# Patient Record
Sex: Female | Born: 1960 | Race: White | Hispanic: No | Smoking: Never smoker
Health system: Southern US, Community
[De-identification: ages and names within clinical notes are randomized; demographics above are authoritative.]

## PROBLEM LIST (undated history)

## (undated) ENCOUNTER — Emergency Department (HOSPITAL_COMMUNITY): Admission: EM | Disposition: A | Discharge status: Home / Self Care | Payer: Self-pay

## (undated) DIAGNOSIS — B009 Herpesviral infection, unspecified: Secondary | ICD-10-CM

## (undated) DIAGNOSIS — F909 Attention-deficit hyperactivity disorder, unspecified type: Secondary | ICD-10-CM

## (undated) DIAGNOSIS — F32A Depression, unspecified: Secondary | ICD-10-CM

## (undated) DIAGNOSIS — F319 Bipolar disorder, unspecified: Secondary | ICD-10-CM

## (undated) DIAGNOSIS — R51 Headache: Secondary | ICD-10-CM

## (undated) DIAGNOSIS — D689 Coagulation defect, unspecified: Secondary | ICD-10-CM

## (undated) DIAGNOSIS — F419 Anxiety disorder, unspecified: Secondary | ICD-10-CM

## (undated) DIAGNOSIS — E785 Hyperlipidemia, unspecified: Secondary | ICD-10-CM

## (undated) DIAGNOSIS — M1990 Unspecified osteoarthritis, unspecified site: Secondary | ICD-10-CM

## (undated) DIAGNOSIS — C44509 Unspecified malignant neoplasm of skin of other part of trunk: Secondary | ICD-10-CM

## (undated) DIAGNOSIS — Z5189 Encounter for other specified aftercare: Secondary | ICD-10-CM

## (undated) DIAGNOSIS — E079 Disorder of thyroid, unspecified: Secondary | ICD-10-CM

## (undated) DIAGNOSIS — K219 Gastro-esophageal reflux disease without esophagitis: Secondary | ICD-10-CM

## (undated) DIAGNOSIS — T7840XA Allergy, unspecified, initial encounter: Secondary | ICD-10-CM

## (undated) DIAGNOSIS — D649 Anemia, unspecified: Secondary | ICD-10-CM

## (undated) DIAGNOSIS — F329 Major depressive disorder, single episode, unspecified: Secondary | ICD-10-CM

## (undated) HISTORY — PX: MOUTH SURGERY: SHX715

## (undated) HISTORY — PX: KNEE SURGERY: SHX244

## (undated) HISTORY — DX: Disorder of thyroid, unspecified: E07.9

## (undated) HISTORY — PX: FRACTURE SURGERY: SHX138

## (undated) HISTORY — DX: Unspecified osteoarthritis, unspecified site: M19.90

## (undated) HISTORY — PX: OTHER SURGICAL HISTORY: SHX169

## (undated) HISTORY — DX: Anxiety disorder, unspecified: F41.9

## (undated) HISTORY — DX: Hyperlipidemia, unspecified: E78.5

## (undated) HISTORY — DX: Coagulation defect, unspecified: D68.9

## (undated) HISTORY — DX: Allergy, unspecified, initial encounter: T78.40XA

## (undated) HISTORY — DX: Encounter for other specified aftercare: Z51.89

## (undated) HISTORY — PX: FOOT SURGERY: SHX648

## (undated) HISTORY — PX: ABDOMINAL SURGERY: SHX537

## (undated) HISTORY — DX: Anemia, unspecified: D64.9

---

## 1984-03-16 DIAGNOSIS — B009 Herpesviral infection, unspecified: Secondary | ICD-10-CM

## 1984-03-16 HISTORY — DX: Herpesviral infection, unspecified: B00.9

## 2000-09-14 ENCOUNTER — Emergency Department (HOSPITAL_COMMUNITY): Admission: EM | Admit: 2000-09-14 | Discharge: 2000-09-14 | Payer: Self-pay

## 2002-06-16 ENCOUNTER — Other Ambulatory Visit: Admission: RE | Admit: 2002-06-16 | Discharge: 2002-06-16 | Payer: Self-pay | Admitting: Obstetrics and Gynecology

## 2002-07-24 ENCOUNTER — Other Ambulatory Visit: Admission: RE | Admit: 2002-07-24 | Discharge: 2002-07-24 | Payer: Self-pay | Admitting: Obstetrics and Gynecology

## 2003-03-26 ENCOUNTER — Encounter: Admission: RE | Admit: 2003-03-26 | Discharge: 2003-03-26 | Payer: Self-pay | Admitting: Obstetrics and Gynecology

## 2003-07-24 ENCOUNTER — Other Ambulatory Visit: Admission: RE | Admit: 2003-07-24 | Discharge: 2003-07-24 | Payer: Self-pay | Admitting: Obstetrics and Gynecology

## 2003-08-09 ENCOUNTER — Other Ambulatory Visit: Admission: RE | Admit: 2003-08-09 | Discharge: 2003-08-09 | Payer: Self-pay | Admitting: Obstetrics and Gynecology

## 2003-09-21 ENCOUNTER — Ambulatory Visit (HOSPITAL_BASED_OUTPATIENT_CLINIC_OR_DEPARTMENT_OTHER): Admission: RE | Admit: 2003-09-21 | Discharge: 2003-09-21 | Payer: Self-pay | Admitting: Obstetrics and Gynecology

## 2004-04-10 ENCOUNTER — Encounter: Admission: RE | Admit: 2004-04-10 | Discharge: 2004-04-10 | Payer: Self-pay | Admitting: Obstetrics and Gynecology

## 2005-01-08 ENCOUNTER — Other Ambulatory Visit: Admission: RE | Admit: 2005-01-08 | Discharge: 2005-01-08 | Payer: Self-pay | Admitting: Obstetrics and Gynecology

## 2005-05-05 ENCOUNTER — Encounter: Admission: RE | Admit: 2005-05-05 | Discharge: 2005-05-05 | Payer: Self-pay | Admitting: *Deleted

## 2005-08-18 ENCOUNTER — Encounter: Admission: RE | Admit: 2005-08-18 | Discharge: 2005-08-18 | Payer: Self-pay | Admitting: Neurology

## 2006-04-20 ENCOUNTER — Other Ambulatory Visit: Admission: RE | Admit: 2006-04-20 | Discharge: 2006-04-20 | Payer: Self-pay | Admitting: Obstetrics & Gynecology

## 2006-05-27 ENCOUNTER — Encounter: Admission: RE | Admit: 2006-05-27 | Discharge: 2006-05-27 | Payer: Self-pay | Admitting: Obstetrics and Gynecology

## 2007-05-13 ENCOUNTER — Other Ambulatory Visit: Admission: RE | Admit: 2007-05-13 | Discharge: 2007-05-13 | Payer: Self-pay | Admitting: Obstetrics & Gynecology

## 2007-06-22 ENCOUNTER — Encounter: Admission: RE | Admit: 2007-06-22 | Discharge: 2007-06-22 | Payer: Self-pay | Admitting: General Practice

## 2008-06-26 ENCOUNTER — Encounter: Admission: RE | Admit: 2008-06-26 | Discharge: 2008-06-26 | Payer: Self-pay | Admitting: Obstetrics and Gynecology

## 2008-06-28 ENCOUNTER — Other Ambulatory Visit: Admission: RE | Admit: 2008-06-28 | Discharge: 2008-06-28 | Payer: Self-pay | Admitting: Obstetrics and Gynecology

## 2010-04-06 ENCOUNTER — Encounter: Payer: Self-pay | Admitting: Obstetrics and Gynecology

## 2010-08-01 NOTE — Op Note (Signed)
NAME:  Kim [name], Kim [name]                          ACCOUNT NO.:  [id number]   MEDICAL RECORD NO.:  [id number]                   PATIENT TYPE:  AMB   LOCATION:  [facility]                                 FACILITY:  [facility]   PHYSICIAN:  Laqueta Linden, M.D.                 DATE OF BIRTH:  [birth year]   DATE OF PROCEDURE:  09/21/2003  DATE OF DISCHARGE:                                 OPERATIVE REPORT   PREOPERATIVE DIAGNOSES:  Menometrorrhagia.   POSTOPERATIVE DIAGNOSES:  Menometrorrhagia.   PROCEDURE:  Hydrothermal ablation.   SURGEON:  Laqueta Linden, M.D.   ANESTHESIA:  General LMA.   SORBITOL NET INTAKE:  Zero.   SPECIMENS:  None.   ESTIMATED BLOOD LOSS:  Less than 5 mL.   COMPLICATIONS:  None.   INDICATIONS FOR PROCEDURE:  Kim [name] is a 50 year old female who has a  long history of worsening menometrorrhagia. She has a history of severe  migraines and it is intolerant of any hormonal manipulation.  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 She has undergone  preoperative evaluation including pelvic ultrasound and a sonohysterogram  which revealed several 1-2 cm intramural fibroids with no intracavitary  lesion such as a submucosal fibroid noted.  Endometrial sampling was benign,  proliferative endometrium with fragments of a polyp noted. She underwent  preoperative endometrial preparation with a single injection of Depot-Lupron  during the luteal phase and surgery has been scheduled at this time which is  one month after the Lupron injection. She has seen the informed consent  film, voiced her understanding and acceptance of the risks, benefits,  alternatives, complications and limitations of this procedure and agrees to  proceed.   DESCRIPTION OF PROCEDURE:  The patient was taken to the operating room and  after proper identification and consents were ascertained, she was placed on  the operating table in the supine position. After the induction of general  LMA, she was placed in the Oakley stirrups and the perineum and vagina were  prepped and draped in a routine sterile fashion. The bladder was entered  with a red rubber catheter. The uterus was noted to be anterior, slightly  irregular and mobile.  A speculum was placed in the vagina and the cervix  was grasped with a single tooth tenaculum. The internal os was patent to a  sound and the uterine cavity sounded to 8 cm. The internal os was gently  dilated to a #21 Pratt dilator. The resectoscope with continuous sorbitol  infusion was inserted under direct vision. Both tubal ostia were visualized.  There was a slight ridge along the posterior uterine wall consistent with  perhaps a small submucosal component of one of the intramural fibroids but  no significant submucosal component was noted. There were no polyps or other  lesions noted.  The endometrial cavity appeared atrophic and to have  responded to the Lupron. The hydrothermal ablation procedure then proceeded  in a routine fashion. Fluid was instilled and pressures were checked and  remained steady consistent with no uterine perforation. The fluid was then  heated and a heated ablative procedure was then performed for 10 minutes at  which time the fluid was cooled. There was a slight amount of fluid deficit  of 3 mL well into the procedure felt to be consistent with an expansion of  the uterine cavity due to the heat and there was no further fluid deficit.  There was no evidence of any fluid leakage whatsoever through the cervix  which was watched continuously throughout the procedure.  After the fluid  was cooled posterior ablative procedure, a photograph was taken the scope  was then withdrawn. The tenaculum site was hemostatic. The patient was  awakened and stable on transfer to the PACU. She received 30 mg of IV  Toradol prior to entering the operating room  and received another 30 mg IM  after she was asleep. She will be observed and discharged per anesthesia  protocol and told to followup in the office in 2-3 weeks time. She was given  routine written and verbal discharge instructions and told to call sooner  for excessive pain, fever, bleeding or other concerns. She is to take  ibuprofen 800 mg q. 8h. p.r.n. cramping with food as well as maintain all  her routine medications. She is to call for excessive cramping or other  concerns.                                               Laqueta Linden, M.D.    Danie Chandler  D:  09/21/2003  T:  09/21/2003  Job:  [id number]

## 2011-07-19 ENCOUNTER — Encounter (HOSPITAL_COMMUNITY): Payer: Self-pay | Admitting: Emergency Medicine

## 2011-07-19 ENCOUNTER — Emergency Department (HOSPITAL_COMMUNITY)
Admission: EM | Admit: 2011-07-19 | Discharge: 2011-07-19 | Payer: Self-pay | Attending: Emergency Medicine | Admitting: Emergency Medicine

## 2011-07-19 DIAGNOSIS — Z01419 Encounter for gynecological examination (general) (routine) without abnormal findings: Secondary | ICD-10-CM | POA: Insufficient documentation

## 2011-07-19 DIAGNOSIS — F909 Attention-deficit hyperactivity disorder, unspecified type: Secondary | ICD-10-CM | POA: Insufficient documentation

## 2011-07-19 HISTORY — DX: Attention-deficit hyperactivity disorder, unspecified type: F90.9

## 2011-07-19 HISTORY — DX: Headache: R51

## 2011-07-19 NOTE — ED Provider Notes (Signed)
Medical screening examination/treatment/procedure(s) were performed by non-physician practitioner and as supervising physician I was immediately available for consultation/collaboration.   Nat Christen, MD 07/19/11 [phone]

## 2011-07-19 NOTE — ED Notes (Addendum)
Pt here with [employer] with a search warrant of vagina.  Pt denies any complaints. Pt states she was getting ready to use crack and that the police believed she put it inside of her.  Pt denies.

## 2011-07-19 NOTE — Discharge Instructions (Signed)
Pelvic Exam A pelvic (gynecologic) exam is an exam of a woman's outer and inner genitals and reproductive organs. At age 53, or before a woman starts to have sexual intercourse, she should have her first pelvic exam. Pelvic exams allow your caregiver to check on normal development and screen for health problems. These exams should be done regularly throughout a woman's life. Usually, a general physical exam is done first. An exam of the breasts is also done. At this visit, you can ask questions about your health, body, menstrual cycles, sex, and birth control methods. Your caregiver will also ask you questions about your health, family health, menstrual periods, immunizations, and if you are sexually active. The information shared between you and your caregiver is kept confidential. REASONS FOR A PELVIC EXAM  Annual exam and Pap test. A Pap test removes cells from the cervix gently with a spatula and a small brush. The cells are tested for infection, precancer, and cancer.   A Pap test is done to screen for cervical cancer.   The first Pap test should be done at age 5.   Between ages 12 and 61, Pap tests are repeated every 2 years.   Beginning at age 34, you are advised to have a Pap test every 3 years as long as your past 3 Pap tests have been normal.   Some women have medical problems that increase the chance of getting cervical cancer. Talk to your caregiver about these problems. It is especially important to talk to your caregiver if a new problem develops soon after your last Pap test. In these cases, your caregiver may recommend more frequent screening and Pap tests.   The above recommendations are the same for women who have or have not gotten the vaccine for HPV (Human Papillomavirus).   If you had a hysterectomy for a problem that was not cancer or a condition that could lead to cancer, then you no longer need Pap tests. However, even if you no longer need a Pap test, a regular exam is a  good idea to make sure no other problems are starting.    If you are between ages 34 and 19, and you have had normal Pap tests going back 10 years, you no longer need Pap tests. However, even if you no longer need a Pap test, a regular exam is a good idea to make sure no other problems are starting.    If you have had past treatment for cervical cancer or a condition that could lead to cancer, you need Pap tests and screening for cancer for at least 20 years after your treatment.   If Pap tests have been discontinued, risk factors (such as a new sexual partner) need to be re-assessed to determine if screening should be resumed.   Some women may need screenings more often if they are at high risk for cervical cancer.   Make sure your female organs are normal and functioning correctly.   Evaluate a mass or other symptoms that suggest a reproductive system cancer.   Explore why you are not able to get pregnant (infertility).   Find a cause for vaginal discharge, itching, or burning.   Get certain types of birth control or start hormone therapy.   Look for causes of urinary incontinence or sexual problems.   Look for signs of sexually transmitted infection (STI).   Follow the progression of labor.   Determine if pregnancy is present or how far advanced the pregnancy is.  You have severe cramps during your menstrual period.   You have pain during sexual intercourse.   You have abnormal menstrual periods.   You have no menstrual period by the age of 31.  PROCEDURE   A pelvic exam is usually painless but may cause mild discomfort.   In unusual circumstances or in young girls, medicines may be used for comfort. A pelvic exam is not done routinely before a girl is sexually active. Special circumstances such as rape, trauma, or medical problems may require an exam.   You will remove all your clothes and will be given a gown. Usually, there is a nurse in the room during the exam  and you can have someone from your family with you also.   The general physical exam will be done first.   Before the pelvic exam starts, the woman lies down on her back on a special table. She puts the heels of her feet into foot rests (stirrups) with her legs apart. A gown, cloth, or paper drape is usually placed over her belly (abdomen) and legs. First, the caregiver checks the normal arrangement of body parts of the outer genitals. This includes the clitoris, vaginal opening, hymen, labia, and the perineal area between the vagina and rectum. The labia are the skin folds surrounding the vaginal opening. The tube that carries urine (urethra) is also examined.   An internal exam is done next. First, the caregiver inserts an instrument called a speculum into the vagina. The speculum has lubricant on it. The speculum helps hold the vaginal walls apart. The caregiver can then examine the vagina and cervix, which is the opening to the womb (uterus). Cultures of any discharge may be taken to check for an infection. A Pap test may be done.   After the internal exam is done, the speculum is removed. The caregiver uses latex gloves with a lubricant on the fingers to gently press against various pelvic organs from inside the vagina while the other hand is on the lower belly. The caregiver will note any tenderness or abnormalities.   If a pelvic exam is done on a woman who is thought to be in labor, her caregiver can check on the baby and how far her cervix has opened.   Following the exam, you will get dressed and can speak with your caregiver.   Ask your caregiver when and how often you should return for future visits.  Finding out the results of your test Ask when your test results will be ready. Make sure you get your test results. TO HAVE A HEALTHY LIFESTYLE:  Follow your caregiver's advice regarding follow-up and future visits.   Get the necessary immunizations according to your age and any  traveling you may do.   Eat a balanced, nourishing diet.   Get plenty of rest and sleep.   Exercise regularly.   Maintain a healthy weight.   Do not smoke or take illegal drugs.   Drink alcohol in moderation or not at all.   If you are sexually active, use some form of birth control if you do not plan to get pregnant.   If you are sexually active, practice safe sex by using a condom to protect against sexually transmitted disease (STD).   Get help or counseling if you have emotional problems.  Document Released: 05/23/2002 Document Revised: 02/19/2011 Document Reviewed: 05/29/2009 [facility] Patient Information 2012 [place], [place].

## 2011-07-19 NOTE — ED Provider Notes (Signed)
History     CSN: [id number]  Arrival date & time 07/19/11  1632   First MD Initiated Contact with Patient 07/19/11 1650      No chief complaint on file.   (Consider location/radiation/quality/duration/timing/severity/associated sxs/prior treatment) HPI Comments: Pt was brought in by police to be evaluated for a possible vaginal or rectal fb:pt denies putting anything in either orifice  The history is provided by the patient. No language interpreter was used.    Past Medical History  Diagnosis Date  . Headache   . ADHD (attention deficit hyperactivity disorder)     Past Surgical History  Procedure Date  . Knee surgery     No family history on file.  History  Substance Use Topics  . Smoking status: Never Smoker   . Smokeless tobacco: Not on file  . Alcohol Use: No    OB History    Grav Para Term Preterm Abortions TAB SAB Ect Mult Living                  Review of Systems  Constitutional: Negative.   Respiratory: Negative.   Cardiovascular: Negative.     Allergies  Review of patient's allergies indicates no known allergies.  Home Medications   Current Outpatient Rx  Name Route Sig Dispense Refill  . IBUPROFEN 200 MG PO TABS Oral Take 200 mg by mouth every 6 (six) hours as needed. For pain or fever      BP 138/97  Pulse 81  Temp(Src) 97.7 F (36.5 C) (Oral)  Resp 20  SpO2 93%  Physical Exam  Nursing note and vitals reviewed. Constitutional: She appears well-developed and well-nourished.  Cardiovascular: Normal rate and regular rhythm.   Pulmonary/Chest: Effort normal and breath sounds normal.  Genitourinary:       No fb not on vaginal or rectal exam  Psychiatric: She is is hyperactive.    ED Course  Procedures (including critical care time)  Labs Reviewed - No data to display No results found.   1. Visit for pelvic exam       MDM  Pt to leave in the custody of the police       Teressa Lower, NP 07/19/11 1719

## 2011-11-05 ENCOUNTER — Emergency Department (HOSPITAL_COMMUNITY)
Admission: EM | Admit: 2011-11-05 | Discharge: 2011-11-08 | Disposition: A | Discharge status: Other Acute Inpatient Hospital | Payer: Self-pay | Attending: Emergency Medicine | Admitting: Emergency Medicine

## 2011-11-05 ENCOUNTER — Encounter (HOSPITAL_COMMUNITY): Payer: Self-pay

## 2011-11-05 DIAGNOSIS — F29 Unspecified psychosis not due to a substance or known physiological condition: Secondary | ICD-10-CM | POA: Insufficient documentation

## 2011-11-05 LAB — COMPREHENSIVE METABOLIC PANEL
Albumin: 3.9 g/dL (ref 3.5–5.2)
BUN: 18 mg/dL (ref 6–23)
Creatinine, Ser: 0.68 mg/dL (ref 0.50–1.10)
Total Protein: 7.8 g/dL (ref 6.0–8.3)

## 2011-11-05 LAB — URINALYSIS, ROUTINE W REFLEX MICROSCOPIC
Bilirubin Urine: NEGATIVE
Hgb urine dipstick: NEGATIVE
Protein, ur: NEGATIVE mg/dL
Urobilinogen, UA: 0.2 mg/dL (ref 0.0–1.0)

## 2011-11-05 LAB — CBC WITH DIFFERENTIAL/PLATELET
Basophils Relative: 1 % (ref 0–1)
Eosinophils Absolute: 0 10*3/uL (ref 0.0–0.7)
HCT: 40.8 % (ref 36.0–46.0)
Hemoglobin: 14.3 g/dL (ref 12.0–15.0)
MCH: 31.2 pg (ref 26.0–34.0)
MCHC: 35 g/dL (ref 30.0–36.0)
Monocytes Absolute: 0.6 10*3/uL (ref 0.1–1.0)
Monocytes Relative: 17 % — ABNORMAL HIGH (ref 3–12)
Neutrophils Relative %: 57 % (ref 43–77)

## 2011-11-05 LAB — RAPID URINE DRUG SCREEN, HOSP PERFORMED
Amphetamines: NOT DETECTED
Cocaine: POSITIVE — AB
Opiates: NOT DETECTED
Tetrahydrocannabinol: NOT DETECTED

## 2011-11-05 MED ORDER — IBUPROFEN 600 MG PO TABS
600.0000 mg | ORAL_TABLET | Freq: Three times a day (TID) | ORAL | Status: DC | PRN
  Administered 2011-11-07: 600 mg via ORAL
  Filled 2011-11-05: qty 1

## 2011-11-05 MED ORDER — ACETAMINOPHEN 325 MG PO TABS: 650.0000 mg | ORAL_TABLET | ORAL | Status: DC | PRN

## 2011-11-05 MED ORDER — LORAZEPAM 1 MG PO TABS: 1.0000 mg | ORAL_TABLET | Freq: Three times a day (TID) | ORAL | Status: DC | PRN

## 2011-11-05 NOTE — ED Notes (Signed)
Pt denies placing items in the middle of the street and reports that she has to give her dog inj because he is diabetic pt asking who is talking about these things pt advised that she was under IVC and she reports that she needs to go home she needs to put away her groceries

## 2011-11-05 NOTE — ED Provider Notes (Signed)
History     CSN: [id number]  Arrival date & time 11/05/11  1344   First MD Initiated Contact with Patient 11/05/11 1644      Chief Complaint  Patient presents with  . Medical Clearance    (Consider location/radiation/quality/duration/timing/severity/associated sxs/prior treatment) The history is provided by the patient and the police. The history is limited by the condition of the patient.  pt brought to ed for  eval of psychotic behavior.  She has been blaring music, placing furniture in the street, injecting dogs with insulin.   She has flight of ideas and obvious psychosis.  Level 5 for psychosis.  Past Medical History  Diagnosis Date  . Headache   . ADHD (attention deficit hyperactivity disorder)     Past Surgical History  Procedure Date  . Knee surgery     No family history on file.  History  Substance Use Topics  . Smoking status: Never Smoker   . Smokeless tobacco: Not on file  . Alcohol Use: No    OB History    Grav Para Term Preterm Abortions TAB SAB Ect Mult Living                  Review of Systems  Unable to perform ROS   Allergies  Penicillins  Home Medications  No current outpatient prescriptions on file.  BP 128/77  Pulse 96  Temp 98.4 F (36.9 C) (Oral)  Resp 16  SpO2 100%  Physical Exam  Nursing note and vitals reviewed. Constitutional: She appears well-developed and well-nourished.  HENT:  Head: Normocephalic and atraumatic.  Eyes: Conjunctivae are normal.  Neck: Normal range of motion.  Pulmonary/Chest: Effort normal.  Abdominal: She exhibits no distension.  Musculoskeletal: Normal range of motion.  Neurological: She is alert. No cranial nerve deficit.  Skin: Skin is warm and dry.  Psychiatric:       Psychotic, flight of ideas. Paranoia.    ED Course  Procedures (including critical care time)acute psychosis Med screening. Will send to psych ed for eval and tx.  Labs Reviewed  CBC WITH DIFFERENTIAL - Abnormal; Notable  for the following:    WBC 3.5 (*)     Monocytes Relative 17 (*)     All other components within normal limits  COMPREHENSIVE METABOLIC PANEL - Abnormal; Notable for the following:    Glucose, Bld 105 (*)     Total Bilirubin 0.2 (*)     All other components within normal limits  URINE RAPID DRUG SCREEN (HOSP PERFORMED) - Abnormal; Notable for the following:    Cocaine POSITIVE (*)     All other components within normal limits  ETHANOL  URINALYSIS, ROUTINE W REFLEX MICROSCOPIC   No results found.   No diagnosis found.    MDM  Acute psychosis        Cheri Guppy, MD 11/05/11 2330

## 2011-11-05 NOTE — ED Notes (Addendum)
Per police officer brought pt in for medical clearance, pt IVC, pt has been having abnormal behaviors, been leaving furniture in middle of street to make neighbors mad, injecting dog on streets w/ insulin, blaring speakers towards neighbors house at 4 in morning.   Denies trying to hurt self  Or others, denies drug use.   denies hearing voices, or auditory hallucinations. Handcuffed.

## 2011-11-06 DIAGNOSIS — F1410 Cocaine abuse, uncomplicated: Secondary | ICD-10-CM

## 2011-11-06 DIAGNOSIS — F3110 Bipolar disorder, current episode manic without psychotic features, unspecified: Secondary | ICD-10-CM

## 2011-11-06 DIAGNOSIS — F909 Attention-deficit hyperactivity disorder, unspecified type: Secondary | ICD-10-CM

## 2011-11-06 MED ORDER — DIVALPROEX SODIUM ER 250 MG PO TB24
250.0000 mg | ORAL_TABLET | Freq: Two times a day (BID) | ORAL | Status: DC
  Administered 2011-11-06 – 2011-11-07 (×3): 250 mg via ORAL
  Filled 2011-11-06 (×4): qty 1

## 2011-11-06 MED ORDER — QUETIAPINE FUMARATE 100 MG PO TABS
100.0000 mg | ORAL_TABLET | Freq: Every day | ORAL | Status: DC
  Administered 2011-11-06: 100 mg via ORAL
  Filled 2011-11-06: qty 1

## 2011-11-06 NOTE — Progress Notes (Signed)
Per Serena Colonel NP, no acute beds at [facility] at this time. Will reconsider when a bed becomes available. Rosey Bath, RN.

## 2011-11-06 NOTE — BH Assessment (Signed)
Assessment Note   Kim [name] is a 51 y.o. female who presents to [facility] for med clearance.  Pt is IVC by [employer]. This Clinical research associate attempted to assess pt, but she told this Clinical research associate she had already told people her story and didn't want to repeat it again and she also stated she had laryngitis.  The following information is collateral.  Pt presented with flight of ideas and exhibiting irrational behaviors.  Pt moved her furniture in the middle of the street in front of her house and has been witnessed injecting her dog with insulin medication.  Pt is confrontational with neighbors; yelling, cursing and threatening them verbally and also with text mssgs.  Pt has entered neighbors home uninvited.  Pt was found on 08/05/11 unconscious behind the wheel in the parking lot at a [employer] on [address] in possession of cocaine.  Pt has not been sleeping well.  Pt sent neighbor text mssg that she would have her cousin in the [employer] to put a nooses around peoples necks.     Axis I: Substance Abuse and Bipolar D/O  Axis II: Deferred Axis III:  Past Medical History  Diagnosis Date  . Headache   . ADHD (attention deficit hyperactivity disorder)    Axis IV: other psychosocial or environmental problems, problems related to legal system/crime, problems related to social environment and problems with primary support group Axis V: 21-30 behavior considerably influenced by delusions or hallucinations OR serious impairment in judgment, communication OR inability to function in almost all areas  Past Medical History:  Past Medical History  Diagnosis Date  . Headache   . ADHD (attention deficit hyperactivity disorder)     Past Surgical History  Procedure Date  . Knee surgery     Family History: History reviewed. No pertinent family history.  Social History:  reports that she has never smoked. She does not have any smokeless tobacco history on file. She reports that she uses illicit drugs (Cocaine). She  reports that she does not drink alcohol.  Additional Social History:  Alcohol / Drug Use Pain Medications: None  Prescriptions: None  Over the Counter: None  History of alcohol / drug use?: Yes Longest period of sobriety (when/how long): None   CIWA: CIWA-Ar BP: 126/77 mmHg Pulse Rate: 88  COWS:    Allergies:  Allergies  Allergen Reactions  . Penicillins     Unknown reaction.    Home Medications:  (Not in a hospital admission)  OB/GYN Status:  No LMP recorded. Patient has had an ablation.  General Assessment Data Location of Assessment: [facility] Living Arrangements: Alone Can pt return to current living arrangement?: Yes Admission Status: Involuntary Is patient capable of signing voluntary admission?: No Transfer from: Acute Hospital Referral Source: MD  Education Status Is patient currently in school?: No Current Grade: None  Highest grade of school patient has completed: None  Name of school: none  Contact person: None   Risk to self Suicidal Ideation: No Suicidal Intent: No Is patient at risk for suicide?: No Suicidal Plan?: No Access to Means: No What has been your use of drugs/alcohol within the last 12 months?: Unk  Previous Attempts/Gestures: No How many times?: 0  Other Self Harm Risks: None  Triggers for Past Attempts: Unknown Intentional Self Injurious Behavior: None Family Suicide History: No Recent stressful life event(s): Other (Comment) (Chronic MH ) Persecutory voices/beliefs?: No Depression: No Depression Symptoms:  (None ) Substance abuse history and/or treatment for substance abuse?: Yes Suicide prevention  information given to non-admitted patients: Not applicable  Risk to Others Homicidal Ideation: No Thoughts of Harm to Others: No Current Homicidal Intent: No Current Homicidal Plan: No Access to Homicidal Means: No Identified Victim: None  History of harm to others?: No Assessment of Violence: None Noted Violent Behavior  Description: None  Does patient have access to weapons?: No Criminal Charges Pending?: No Does patient have a court date: No  Psychosis Hallucinations: None noted Delusions: None noted  Mental Status Report Appear/Hygiene: Disheveled Eye Contact: Poor Motor Activity: Unremarkable Speech: Unable to assess Level of Consciousness: Drowsy;Sleeping Mood: Irritable (Unable ) Affect: Irritable Anxiety Level: None Thought Processes: Flight of Ideas Judgement: Impaired Orientation: Person;Place;Situation Obsessive Compulsive Thoughts/Behaviors: None  Cognitive Functioning Concentration: Normal Memory: Recent Intact;Remote Intact IQ: Average Insight: Poor Impulse Control: Poor Appetite: Fair Weight Loss: 0  Weight Gain: 0  Sleep: No Change Total Hours of Sleep: 6  Vegetative Symptoms: None  ADLScreening [facility] Assessment Services) Patient's cognitive ability adequate to safely complete daily activities?: Yes Patient able to express need for assistance with ADLs?: Yes Independently performs ADLs?: Yes (appropriate for developmental age)  Abuse/Neglect [facility]) Physical Abuse: Denies Verbal Abuse: Denies Sexual Abuse: Denies  Prior Inpatient Therapy Prior Inpatient Therapy: No Prior Therapy Dates: None  Prior Therapy Facilty/Provider(s): None  Reason for Treatment: None   Prior Outpatient Therapy Prior Outpatient Therapy: No Prior Therapy Dates: None  Prior Therapy Facilty/Provider(s): None  Reason for Treatment: None   ADL Screening (condition at time of admission) Patient's cognitive ability adequate to safely complete daily activities?: Yes Patient able to express need for assistance with ADLs?: Yes Independently performs ADLs?: Yes (appropriate for developmental age) Weakness of Legs: None Weakness of Arms/Hands: None  Home Assistive Devices/Equipment Home Assistive Devices/Equipment: None  Therapy Consults (therapy consults require a physician order) PT  Evaluation Needed: No OT Evalulation Needed: No SLP Evaluation Needed: No Abuse/Neglect Assessment (Assessment to be complete while patient is alone) Physical Abuse: Denies Verbal Abuse: Denies Sexual Abuse: Denies Exploitation of patient/patient's resources: Denies Self-Neglect: Denies Values / Beliefs Cultural Requests During Hospitalization: None Spiritual Requests During Hospitalization: None Consults Spiritual Care Consult Needed: No Social Work Consult Needed: No Merchant navy officer (For Healthcare) Advance Directive: Patient does not have advance directive;Patient would not like information Pre-existing out of facility DNR order (yellow form or pink MOST form): No Nutrition Screen- [facility] Adult/[facility] Patient's home diet: Regular Have you recently lost weight without trying?: No Have you been eating poorly because of a decreased appetite?: No Malnutrition Screening Tool Score: 0   Additional Information 1:1 In Past 12 Months?: No CIRT Risk: No Elopement Risk: No Does patient have medical clearance?: Yes     Disposition:  Disposition Disposition of Patient: Inpatient treatment program;Referred to [facility] ) Type of inpatient treatment program: Adult Patient referred to: Other (Comment) [facility] )  On Site Evaluation by:   Reviewed with Physician:     Murrell Redden 11/06/2011 3:36 AM

## 2011-11-06 NOTE — Consult Note (Signed)
Reason for Consult: Bipolar disorder, ADHD, and cocaine intoxication Referring Physician: Dr. Carroll Sage Kim [name] is an 51 y.o. female.  HPI: Patient was seen and chart reviewed patient has no previous acute psychiatric hospitalization. Patient was the placed on involuntary commitment by [facility] Department who found her acting bizarre, leaving her furniture in the middle of the street, injecting insulin into her dog on a street and blaring speakers towards neighbors at 4 AM in the morning. Patient stated she has been seeing the [facility] and taking unknown amount of Ritalin and 50 mg Seroquel. Her urine drug screen was positive for cocaine. Patient was fairly cooperative and when asking about family She started shouting with irritability and anger. Patient stated she has a 3 animals at home. Patient denied suicidal ideation, homicidal ideation, intentions or plans. Patient has denied psychotic symptoms. She need to go home and care for them.   Past Medical History  Diagnosis Date  . Headache   . ADHD (attention deficit hyperactivity disorder)     Past Surgical History  Procedure Date  . Knee surgery     History reviewed. No pertinent family history.  Social History:  reports that she has never smoked. She does not have any smokeless tobacco history on file. She reports that she uses illicit drugs (Cocaine). She reports that she does not drink alcohol.  Allergies:  Allergies  Allergen Reactions  . Penicillins     Unknown reaction.    Medications: I have reviewed the patient's current medications.  Results for orders placed during the hospital encounter of 11/05/11 (from the past 48 hour(s))  ETHANOL     Status: Normal   Collection Time   11/05/11  3:20 PM      Component Value Range Comment   Alcohol, Ethyl (B) <11  0 - 11 mg/dL   CBC WITH DIFFERENTIAL     Status: Abnormal   Collection Time   11/05/11  3:20 PM      Component Value Range Comment   WBC 3.5 (*) 4.0 - 10.5 K/uL    RBC 4.59  3.87 - 5.11 MIL/uL    Hemoglobin 14.3  12.0 - 15.0 g/dL    HCT 11.9  14.7 - 82.9 %    MCV 88.9  78.0 - 100.0 fL    MCH 31.2  26.0 - 34.0 pg    MCHC 35.0  30.0 - 36.0 g/dL    RDW 56.2  13.0 - 86.5 %    Platelets 286  150 - 400 K/uL    Neutrophils Relative 57  43 - 77 %    Neutro Abs 2.0  1.7 - 7.7 K/uL    Lymphocytes Relative 25  12 - 46 %    Lymphs Abs 0.9  0.7 - 4.0 K/uL    Monocytes Relative 17 (*) 3 - 12 %    Monocytes Absolute 0.6  0.1 - 1.0 K/uL    Eosinophils Relative 0  0 - 5 %    Eosinophils Absolute 0.0  0.0 - 0.7 K/uL    Basophils Relative 1  0 - 1 %    Basophils Absolute 0.0  0.0 - 0.1 K/uL   COMPREHENSIVE METABOLIC PANEL     Status: Abnormal   Collection Time   11/05/11  3:20 PM      Component Value Range Comment   Sodium 139  135 - 145 mEq/L    Potassium 3.5  3.5 - 5.1 mEq/L    Chloride 106  96 - 112 mEq/L    CO2 26  19 - 32 mEq/L    Glucose, Bld 105 (*) 70 - 99 mg/dL    BUN 18  6 - 23 mg/dL    Creatinine, Ser 5.57  0.50 - 1.10 mg/dL    Calcium 9.3  8.4 - 32.2 mg/dL    Total Protein 7.8  6.0 - 8.3 g/dL    Albumin 3.9  3.5 - 5.2 g/dL    AST 22  0 - 37 U/L    ALT 20  0 - 35 U/L    Alkaline Phosphatase 89  39 - 117 U/L    Total Bilirubin 0.2 (*) 0.3 - 1.2 mg/dL    GFR calc non Af Amer >90  >90 mL/min    GFR calc Af Amer >90  >90 mL/min   URINE RAPID DRUG SCREEN (HOSP PERFORMED)     Status: Abnormal   Collection Time   11/05/11  3:41 PM      Component Value Range Comment   Opiates NONE DETECTED  NONE DETECTED    Cocaine POSITIVE (*) NONE DETECTED    Benzodiazepines NONE DETECTED  NONE DETECTED    Amphetamines NONE DETECTED  NONE DETECTED    Tetrahydrocannabinol NONE DETECTED  NONE DETECTED    Barbiturates NONE DETECTED  NONE DETECTED   URINALYSIS, ROUTINE W REFLEX MICROSCOPIC     Status: Normal   Collection Time   11/05/11  3:41 PM      Component Value Range Comment   Color, Urine YELLOW  YELLOW    APPearance CLEAR   CLEAR    Specific Gravity, Urine 1.030  1.005 - 1.030    pH 6.0  5.0 - 8.0    Glucose, UA NEGATIVE  NEGATIVE mg/dL    Hgb urine dipstick NEGATIVE  NEGATIVE    Bilirubin Urine NEGATIVE  NEGATIVE    Ketones, ur NEGATIVE  NEGATIVE mg/dL    Protein, ur NEGATIVE  NEGATIVE mg/dL    Urobilinogen, UA 0.2  0.0 - 1.0 mg/dL    Nitrite NEGATIVE  NEGATIVE    Leukocytes, UA NEGATIVE  NEGATIVE MICROSCOPIC NOT DONE ON URINES WITH NEGATIVE PROTEIN, BLOOD, LEUKOCYTES, NITRITE, OR GLUCOSE <1000 mg/dL.    No results found.  Positive for bad mood, behavior problems, bipolar, illegal drug usage and mood swings Blood pressure 138/80, pulse 78, temperature 98.4 F (36.9 C), temperature source Oral, resp. rate 18, SpO2 99.00%.   Assessment/Plan: Bipolar disorder, most recent episode is mania Attention deficit hyperactive disorder by history Cocaine intoxication  Recommended inpatient psychiatric hospitalization for crisis stabilization and the appropriate medication management. Patient will start Seroquel 100 mg and the Depakote 250 mg twice daily.  Kim [name],JANARDHAHA R. 11/06/2011, 12:04 PM

## 2011-11-07 MED ORDER — RISPERIDONE 2 MG PO TABS
2.0000 mg | ORAL_TABLET | Freq: Two times a day (BID) | ORAL | Status: DC
  Administered 2011-11-07: 2 mg via ORAL
  Filled 2011-11-07: qty 1

## 2011-11-07 MED ORDER — BROMPHENIRAMINE-PSEUDOEPH 1-15 MG/5ML PO ELIX
5.0000 mL | ORAL_SOLUTION | Freq: Three times a day (TID) | ORAL | Status: DC | PRN
  Filled 2011-11-07: qty 5

## 2011-11-07 MED ORDER — DIVALPROEX SODIUM ER 500 MG PO TB24
500.0000 mg | ORAL_TABLET | Freq: Two times a day (BID) | ORAL | Status: DC
  Filled 2011-11-07 (×4): qty 1

## 2011-11-07 NOTE — ED Notes (Signed)
eating breakfast

## 2011-11-07 NOTE — ED Notes (Signed)
Pt sleeping soundly, easily aroused, reports that seroquel makes her very sleepy.
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
## 2011-11-07 NOTE — ED Provider Notes (Signed)
Dr Henderson Cloud recommends dc seroquel.  Start risperdal 2mg  po bid and increase depakote to 500 mg po bid  Celene Kras, MD 11/07/11 2026

## 2011-11-07 NOTE — ED Notes (Signed)
Pt wanting to leave.  Pt is aware that she can not leave until the MD discharges her and that the psychatrist has recommended that she be admitted.  Pt reports that she does not agree with that and does not want to see that Dr. Again and is requesting to see another MD.  Will notify the  EDP.  Pt also reports that she has been having sinus problems for a couple of weeks and is now blowing green/bloody secretions.

## 2011-11-07 NOTE — ED Notes (Signed)
Up to the bathroom 

## 2011-11-07 NOTE — ED Provider Notes (Addendum)
Pt resting comfortably, vitals normal, nad. Discussed w act team  - awaiting placement.   Suzi Roots, MD 11/07/11 [phone]  Nurse indicates pt requests 2nd opinion/psych eval - will get telepsych consult. Also request med for sinus congestion. Will rx zyrtec d prn.     Suzi Roots, MD 11/07/11 [phone]
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
## 2011-11-07 NOTE — ED Notes (Signed)
Up to the desk on the phone, ambulatory w/o difficulty 

## 2011-11-07 NOTE — ED Notes (Signed)
Pt requesting a snack and soda  And reported to the staff that she is diabetic

## 2011-11-08 ENCOUNTER — Encounter (HOSPITAL_COMMUNITY): Payer: Self-pay | Admitting: *Deleted

## 2011-11-08 ENCOUNTER — Inpatient Hospital Stay (HOSPITAL_COMMUNITY)
Admission: AD | Admit: 2011-11-08 | Discharge: 2011-11-13 | DRG: 885 | Disposition: A | Discharge status: Home / Self Care | Payer: Federal, State, Local not specified - Other | Source: Other Acute Inpatient Hospital | Attending: Psychiatry | Admitting: Psychiatry

## 2011-11-08 DIAGNOSIS — F3112 Bipolar disorder, current episode manic without psychotic features, moderate: Principal | ICD-10-CM | POA: Diagnosis present

## 2011-11-08 DIAGNOSIS — F1910 Other psychoactive substance abuse, uncomplicated: Secondary | ICD-10-CM

## 2011-11-08 DIAGNOSIS — B009 Herpesviral infection, unspecified: Secondary | ICD-10-CM | POA: Diagnosis present

## 2011-11-08 DIAGNOSIS — F1994 Other psychoactive substance use, unspecified with psychoactive substance-induced mood disorder: Secondary | ICD-10-CM

## 2011-11-08 DIAGNOSIS — F1410 Cocaine abuse, uncomplicated: Secondary | ICD-10-CM | POA: Diagnosis present

## 2011-11-08 HISTORY — DX: Herpesviral infection, unspecified: B00.9

## 2011-11-08 MED ORDER — ACETAMINOPHEN 325 MG PO TABS: 650.0000 mg | ORAL_TABLET | Freq: Four times a day (QID) | ORAL | Status: DC | PRN

## 2011-11-08 MED ORDER — MAGNESIUM HYDROXIDE 400 MG/5ML PO SUSP: 30.0000 mL | Freq: Every day | ORAL | Status: DC | PRN

## 2011-11-08 MED ORDER — DIVALPROEX SODIUM ER 250 MG PO TB24
250.0000 mg | ORAL_TABLET | Freq: Two times a day (BID) | ORAL | Status: DC
  Filled 2011-11-08 (×7): qty 1

## 2011-11-08 MED ORDER — ALUM & MAG HYDROXIDE-SIMETH 200-200-20 MG/5ML PO SUSP
30.0000 mL | ORAL | Status: DC | PRN
  Administered 2011-11-11 – 2011-11-12 (×2): 30 mL via ORAL

## 2011-11-08 MED ORDER — IBUPROFEN 600 MG PO TABS
600.0000 mg | ORAL_TABLET | Freq: Three times a day (TID) | ORAL | Status: DC | PRN
  Administered 2011-11-08 (×2): 600 mg via ORAL
  Filled 2011-11-08 (×2): qty 1

## 2011-11-08 MED ORDER — QUETIAPINE FUMARATE 100 MG PO TABS
100.0000 mg | ORAL_TABLET | Freq: Every day | ORAL | Status: DC
  Administered 2011-11-08 – 2011-11-09 (×2): 100 mg via ORAL
  Filled 2011-11-08 (×5): qty 1
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
## 2011-11-08 NOTE — ED Notes (Signed)
Spoke to Dorrington from behavioral health center and informed her of recommendation for patient. Plans to transferr patient to [facility] as sson as attending ED doctor completes paperwork. Patient has signed and is aware-  Previously IVC awaiting escort by [employer]

## 2011-11-08 NOTE — Progress Notes (Signed)
Psychoeducational Group Note  Date:  11/08/2011 Time:  1000am  Group Topic/Focus:  Making Healthy Choices:   The focus of this group is to help patients identify negative/unhealthy choices they were using prior to admission and identify positive/healthier coping strategies to replace them upon discharge.  Participation Level:  Did Not Attend  Participation Quality:    Affect:    Additional Comments:  She is a new admission, came in the early am. She is sleeping   Kim [name] 11/08/2011,10:15 AM

## 2011-11-08 NOTE — Progress Notes (Signed)
Psychoeducational Group Note  Date:  11/08/2011 Time:  1515  Group Topic/Focus:  Crisis Planning:   The purpose of this group is to help patients create a crisis plan for use upon discharge or in the future, as needed.  Participation Level:  Did Not Attend  Participation Quality:    Affect:    Cognitive:    Insight:    Engagement in Group:    Additional Comments:  Pt was sleeping  Fed Ceci M 11/08/2011, 4:10 PM

## 2011-11-08 NOTE — BHH Counselor (Signed)
Pt accepted to [facility] [place] to [place], [phone]. RN and EDP notified. Signed paperwork faxed to [facility]. No copy of [facility] in pt chart but [facility] Gabriel Cirri confirms she already has a copy.

## 2011-11-08 NOTE — Tx Team (Signed)
Initial Interdisciplinary Treatment Plan  PATIENT STRENGTHS: (choose at least two) Ability for insight Active sense of humor Average or above average intelligence Communication skills General fund of knowledge Motivation for treatment/growth Physical Health Special hobby/interest Supportive family/friends Work skills  PATIENT STRESSORS: Legal issue Marital or family conflict Medication change or noncompliance Substance abuse   PROBLEM LIST: Problem List/Patient Goals Date to be addressed Date deferred Reason deferred Estimated date of resolution  Mood D/O      Substance Abuse                                                 DISCHARGE CRITERIA:  Ability to meet basic life and health needs Adequate post-discharge living arrangements Improved stabilization in mood, thinking, and/or behavior Motivation to continue treatment in a less acute level of care Need for constant or close observation no longer present Reduction of life-threatening or endangering symptoms to within safe limits Safe-care adequate arrangements made Verbal commitment to aftercare and medication compliance Withdrawal symptoms are absent or subacute and managed without 24-hour nursing intervention  PRELIMINARY DISCHARGE PLAN: Attend 12-step recovery group Outpatient therapy Return to previous living arrangement Return to previous work or school arrangements  PATIENT/FAMIILY INVOLVEMENT: This treatment plan has been presented to and reviewed with the patient, Kim [name].  The patient has been given the opportunity to ask questions and make suggestions.  Arturo Morton 11/08/2011, 4:34 AM
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
## 2011-11-08 NOTE — Progress Notes (Signed)
Patient ID: Kim [name], female   DOB: 1960/09/02, 51 y.o.   MRN: [id number]    [facility] Group Notes:  (Counselor/Nursing/MHT/Case Management/Adjunct)  11/08/2011 11 AM  Type of Therapy:  Aftercare Planning, Group Therapy, Dance/Movement Therapy   Participation Level:  Did Not Attend.  Patient received daily workbook on healthy support systems and suicide prevention.

## 2011-11-08 NOTE — Progress Notes (Signed)
Pt has been up and has been visible in milieu this evening, but has had minimal interaction or participation in various milieu activities. Pt spoke about conflict with neighbors today, pt also refused depakote today and stated that she does not need the medicine or want to take it. Pt did take seroquel this evening without incident and has received ibuprofen for pain she is experiencing in her feet, pt has denied any suicidality this evening, support and encouragement provided, will continue to monitor
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
## 2011-11-09 DIAGNOSIS — F1410 Cocaine abuse, uncomplicated: Secondary | ICD-10-CM

## 2011-11-09 DIAGNOSIS — F3110 Bipolar disorder, current episode manic without psychotic features, unspecified: Secondary | ICD-10-CM

## 2011-11-09 DIAGNOSIS — F3112 Bipolar disorder, current episode manic without psychotic features, moderate: Principal | ICD-10-CM | POA: Diagnosis present

## 2011-11-09 MED ORDER — CARBAMAZEPINE ER 200 MG PO TB12
200.0000 mg | ORAL_TABLET | ORAL | Status: DC
  Administered 2011-11-09 – 2011-11-11 (×4): 200 mg via ORAL
  Filled 2011-11-09 (×8): qty 1
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
## 2011-11-09 NOTE — Progress Notes (Signed)
Psychoeducational Group Note  Date:  11/09/2011 Time:  1100  Group Topic/Focus:  Self Care:   The focus of this group is to help patients understand the importance of self-care in order to improve or restore emotional, physical, spiritual, interpersonal, and financial health.  Participation Level:  Minimal  Participation Quality:  Intrusive, Monopolizing and Resistant  Affect:  Blunted  Cognitive:  Confused  Insight:  None  Engagement in Group:  Limited  Additional Comments:  Pt entered group late and was extremely disruptive during group.  [name] E 11/09/2011, 12:41 PM

## 2011-11-09 NOTE — Progress Notes (Signed)
[facility] MD Progress Note  11/09/2011 4:10 PM  Diagnosis:  Axis I:  Bipolar I Disorder - Most Recent Episode - Manic.   Cocaine Abuse.  The patient was seen today and reports the following:   ADL's: Good.  Sleep: The patient reports to sleeping reasonably well last night.  Appetite: The patient reports a good appetite today.   Mild>(1-10) >Severe  Hopelessness (1-10): 0  Depression (1-10): 0  Anxiety (1-10): 5-6   Suicidal Ideation: The patient denies any current suicidal ideations today.  Plan: No  Intent: No  Means: No   Homicidal Ideation: The patient denies any current homicidal ideations today.  Plan: No  Intent: No.  Means: No   General Appearance/Behavior: The patient was moderately manic with flight of ideas and pressured speech.  Eye Contact: Good.  Speech: Increased in rate and volume with moderate pressuring noted.  Motor Behavior:  Agitated and manic. Level of Consciousness: Alert and Oriented x 3.  Mental Status: Alert and Oriented x 3.  Mood: Depressed - Moderately manic.  Affect: Moderately expansive.  Anxiety Level: Moderate anxiety reported.  Thought Process: Appears mildly grandiose today.  Thought Content: The patient denies any auditory or visual hallucinations today.  She denies any delusional thinking but appears to have pressured speech and flight of ideas.  Perception: Appears mildly grandiose.  Judgment: Poor.  Insight: Poor.  Cognition: Appropriate.  Sleep:  Number of Hours: 6.75    Vital Signs:Blood pressure 90/68, pulse 120, temperature 96.8 F (36 C), temperature source Oral, resp. rate 18, height 4' 11.5" (1.511 m), weight 48.535 kg (107 lb).  Current Medications: Current Facility-Administered Medications  Medication Dose Route Frequency Provider Last Rate Last Dose  . acetaminophen (TYLENOL) tablet 650 mg  650 mg Oral Q6H PRN Sanjuana Kava, NP      . alum & mag hydroxide-simeth (MAALOX/MYLANTA) 200-200-20 MG/5ML suspension 30 mL  30 mL  Oral Q4H PRN Sanjuana Kava, NP      . carbamazepine (TEGRETOL XR) 12 hr tablet 200 mg  200 mg Oral BH-qamhs Ledon Weihe D Alwilda Gilland, MD      . ibuprofen (ADVIL,MOTRIN) tablet 600 mg  600 mg Oral TID PRN Sanjuana Kava, NP   600 mg at 11/08/11 2105  . magnesium hydroxide (MILK OF MAGNESIA) suspension 30 mL  30 mL Oral Daily PRN Sanjuana Kava, NP      . QUEtiapine (SEROQUEL) tablet 100 mg  100 mg Oral QHS Mickie D. Adams, PA   100 mg at 11/08/11 2105  . DISCONTD: divalproex (DEPAKOTE ER) 24 hr tablet 250 mg  250 mg Oral BID AC Mickie D. Pernell Dupre, [place]        Lab Results:  Results for orders placed during the hospital encounter of 11/05/11 (from the past 48 hour(s))  CARBAMAZEPINE LEVEL, TOTAL     Status: Abnormal   Collection Time   11/07/11 10:08 PM      Component Value Range Comment   Carbamazepine Lvl <0.5 (*) 4.0 - 12.0 ug/mL    Physical Findings: AIMS: Facial and Oral Movements Muscles of Facial Expression: None, normal Lips and Perioral Area: None, normal Jaw: None, normal Tongue: None, normal,Extremity Movements Upper (arms, wrists, hands, fingers): None, normal Lower (legs, knees, ankles, toes): None, normal, Trunk Movements Neck, shoulders, hips: None, normal, Overall Severity Severity of abnormal movements (highest score from questions above): None, normal Incapacitation due to abnormal movements: None, normal Patient's awareness of abnormal movements (rate only patient's report): No Awareness, Dental Status  Current problems with teeth and/or dentures?: No Does patient usually wear dentures?: No   Review of Systems:  Neurological: The patient denies any headaches today. She denies any seizures or dizziness.  G.I.: The patient denies any constipation today or G.I. Upset.  Musculoskeletal: The patient denies any muscle or skeletal difficulties today.   Time was spent today discussing with the patient her current symptoms.  The patient states that she is sleeping well and reports a good  appetite.  She denies any significant feelings of sadness, anhedonia or depressed mood and denies any suicidal or homicidal ideations.  She also denies any auditory or visual hallucinations or delusional thinking.  She does report moderate anxiety symptoms and displays significant manic symptoms.  The patient also was positive for cocaine at time of admission but states she has no difficulties with illicit drugs or alcohol.  She is being admitted for evaluation and treatment of her symptoms.  Treatment Plan Summary:  1. Daily contact with patient to assess and evaluate symptoms and progress in treatment  2. Medication management  3. The patient will deny suicidal ideations or homicidal ideations for 48 hours prior to discharge and have a depression and anxiety rating of 3 or less. The patient will also deny any auditory or visual hallucinations or delusional thinking and display no manic or hypomanic behaviors.  4. The patient will deny any symptoms of substance withdrawal at time of discharge.   Plan:  1. Will continue the patient on the medication Seroquel at 100 mgs po qhs for mood stabilization and to address her psychosis.  2. Will start the patient on the medication Tegretol XR at 200 mgs po q am and hs for mood stabilization. 3. Laboratory Studies reviewed.  4. Will continue to monitor.   Saud Bail 11/09/2011, 4:10 PM

## 2011-11-09 NOTE — Progress Notes (Signed)
Refused Depakote this am. States that she doesn't feel that she needs both Depakote and Seroquel. Wants to just continue the Seroquel.

## 2011-11-09 NOTE — Progress Notes (Signed)
Patient ID: Kim [name], female   DOB: 06/15/1960, 51 y.o.   MRN: [id number]   D: Patient lying in bed with eyes closed. Appears asleep at this time. Respirations even and non-labored. A: Staff will monitor on q15 minute and follow treatments and meds as ordered. R: No response at this time.

## 2011-11-09 NOTE — Progress Notes (Signed)
Pt has been up and has been active in the milieu today and has been participating in various activities, pt presents with pressured speech and flight of ideas, pt has denied any depression or suicidal thoughts, pt still refusing depakote but expressed desire to be put on her medications she should be taken, and also expressed a desire to leave as soon as possible, support and encouragement provided, will continue to monitor

## 2011-11-09 NOTE — Progress Notes (Signed)
Nutrition Brief Note  Patient identified on the Malnutrition Screening Tool (MST) report for unintended weight loss, generating a score of 3.   Body mass index is 21.25 kg/(m^2). Pt meets criteria for healthy normal weight based on current BMI.   - Met with pt who reports eating well PTA, 3 meals/day, sometimes cooking, sometimes eating out. Pt reports her usual body weight is 125 pounds however she has lost a lot of weight in the past 3 weeks related to marital stress. Pt reports she has been eating like a horse during admission. Pt does not want any nutritional supplements during admission as she has been eating well.   Suspect pt with some degree of malnutrition r/t reported 14.4% weight loss in the past 3 weeks however pt reports eating excellent.   No further nutrition intervention indicated at this time.   Dietitian# [phone]

## 2011-11-09 NOTE — Progress Notes (Signed)
[facility] Group Notes:  (Counselor/Nursing/MHT/Case Management/Adjunct)  11/09/2011 2:18 PM  Type of Therapy:  Group Therapy  Participation Level:  Active  Participation Quality:  Intrusive, Monopolizing and Redirectable  Affect:  Appropriate  Cognitive:  Oriented  Insight:  Limited  Engagement in Group:  Good  Engagement in Therapy:  Good  Modes of Intervention:  Education and Limit-setting  Summary of Progress/Problems: Patient was very intrusive and hyper-verbal. She had a response to everything other group members said. She talked about bad relationship with her mother, having migraines and pain, years of feeling suicidal, havingher dog and having to force an injection on him, to having a collection of knives. Patient did allow for redirection. She did not realize how she was effecting her peers, as observed by counselor, of rolling their eyes, becoming disengaged while she was talking, etc. One patient left the room due to her foul language.  Kim [name], [name] 11/09/2011, 2:18 PM

## 2011-11-09 NOTE — Care Management (Signed)
Patient did not attend After-Care planning group this AM. Joice Lofts RN MS EdS 11/09/2011  5:21 PM

## 2011-11-09 NOTE — Tx Team (Signed)
Interdisciplinary Treatment Plan Update (Adult)  Date: 11/09/2011  Time Reviewed: 1000  Progress in Treatment: Attending groups: No Participating in groups:  No Taking medication as prescribed: Exploring medications Tolerating medication:  Yes Family/Significant othe contact made:  Counselor exploring appropriate contacts. Patient understands diagnosis:  Limited Insight Discussing patient identified problems/goals with staff:  No, patient manic with limited insight. Medical problems stabilized or resolved:  Yes Denies suicidal/homicidal ideation: Yes Issues/concerns per patient self-inventory:  None identified Other: N/A  New problem(s) identified: None Identified  Reason for Continuation of Hospitalization: Mania Medication stabilization   Interventions implemented related to continuation of hospitalization: mood stabilization, medication monitoring and adjustment, group therapy and psycho education, safety checks q 15 mins  Additional comments: N/A  Estimated length of stay: 3-5 days  Discharge Plan: SW is assessing for appropriate referrals.   New goal(s): N/A  Review of initial/current patient goals per problem list:   1.  Goal(s): Reduce depressive symptoms  Met:  No  Target date: by discharge  As evidenced by: Reducing depression from a 10 to a 3 as reported by pt.   2.  Goal (s): Eliminate Suicidal Ideation  Met:  No  Target date: by discharge  As evidenced by: Eliminate suicidal ideation.   3.  Goal(s): Reduce Manic Symtpoms  Met:  No  Target date: by discharge  As evidenced by: Reduce manic symptoms to baseline, as reported by pt.      Attendees: Patient:  Kim [name] 11/09/2011 1000  Family:     Physician:  Franchot Gallo, MD 11/09/2011 1000  Nursing:      Case Manager:  Reyes Ivan, LCSWA 11/09/2011 1000  Counselor:  Veto Kemps, MT-BC 11/09/2011 1000  Other:  Barrie Folk RN MS EdS 11/09/2011 1000  Other:  Joslyn Devon RN  11/09/2011 1000  Other:  Tacy Learn RN 11/09/2011 1000  Other:      Scribe for Treatment Team:   Barrie Folk 11/09/2011 5:21 PM

## 2011-11-09 NOTE — Progress Notes (Signed)
11/09/2011      Time: 0930      Group Topic/Focus: The focus of this group is on discussing various styles of communication and communicating assertively using 'I' (feeling) statements.  Participation Level: Active  Participation Quality: Monopolizing and Redirectable  Affect: Labile  Cognitive: Alert   Additional Comments: Patient outspoken and monopolizing, required some redirection to remain on task.   Kim [name] 11/09/2011 1:51 PM
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
## 2011-11-10 DIAGNOSIS — F3112 Bipolar disorder, current episode manic without psychotic features, moderate: Principal | ICD-10-CM

## 2011-11-10 MED ORDER — QUETIAPINE FUMARATE 50 MG PO TABS
150.0000 mg | ORAL_TABLET | Freq: Every day | ORAL | Status: DC
  Administered 2011-11-10: 150 mg via ORAL
  Filled 2011-11-10 (×3): qty 1

## 2011-11-10 NOTE — Progress Notes (Signed)
[facility] MD Progress Note                                         11/10/2011    Kameisha Malicki [birth year]    [id number] Hospital day #2  1. Bipolar I disorder, most recent episode (or current) manic, moderate   2. Cocaine abuse   The patient was seen today and reports the following:  Sleep: great  Appetite: good  Mild (1-10) Severe  Depression (1-10):  0 Anxiety (1-10): 7 Hopelessness (1-10): 2   Suicidal Ideation: The patient denies suicidal ideation. Plan: None Intent: None Means: None  Homicidal Ideation: The patient denies homicidal ideation. Plan: None Intent: None Means: None  Eye Contact:  fair General Appearance: disheveled Behavior:  Cooperative  Motor Behavior: increased Speech:rapid and pressured  Mental Status:  Orientation x 3 Level of Consciousness:   alert Mood: grandiose Affect: labile   Thought Process: circumstantial Thought Content: denies AH/VH Perception: intact  Judgment: impaired Insight: lacking Cognition: at least average  VS: height is 4' 11.5" (1.511 m) and weight is 48.535 kg (107 lb). Her oral temperature is 97.5 F (36.4 C). Her blood pressure is 121/82 and her pulse is 123. Her respiration is 18.  Current Medication:   . carbamazepine  200 mg Oral BH-qamhs  . QUEtiapine  100 mg Oral QHS    Lab results: No results found for this or any previous visit (from the past 48 hour(s)).   No results found for this or any previous visit for  Last 48 hours.  Group attendance:2/4 ROS:    Constitutional: WDWN Adult in NAD   GI: Negative for N,V,D,C   Neuro: Negative for dizziness, blurred vision, visual changes, headaches   Resp: Negative for wheezing, SOB, cough   Cardio: Negative for CP, diaphoresis, fatigue   MSK: Negative for joint pain, swelling, DROM, or ambulatory difficulties.  Time was spent with the patient discussing the current symptoms and the response to treatment. The patient was labile and emotion with grandiose  pressured speech. She is focused on going home, and her pets. She is dramatic and begging to go home, bargaining and demonstrating poor judgement and no insight.   Treatment Summary: 1. Continue crisis management and stabilization. 2. Medication management to reduce current symptoms to base line and improve the     patient's overall level of functioning 3. Treat health problems as indicated. 4. Develop treatment plan to decrease risk of relapse upon discharge and the need for     readmission. 5. Psycho-social education regarding relapse prevention and self care. 6. Health care follow up as needed for medical problems. 7. Restart home medications where appropriate.   Treatment Plan: Plan:  1. Will continue the patient on the medication Seroquel at 100 mgs po qhs for mood stabilization and to address her psychosis. Plan to increase upward to 200 mg. 2. Will start the patient on the medication Tegretol XR at 200 mgs po q am and hs for mood stabilization.  3. Laboratory Studies reviewed.  4. Will continue to monitor.  [name]. Adrienna Karis [facility] 11/10/2011
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
## 2011-11-10 NOTE — Care Management (Signed)
Patient did not attend discharge planning group this AM. Patient has home, transportation and will receive medications through [facility] at discharge. Joice Lofts RN MS EdS 11/10/2011  3:41 PM

## 2011-11-10 NOTE — Progress Notes (Signed)
[facility] Group Notes:  (Counselor/Nursing/MHT/Case Management/Adjunct)  11/10/2011 8:27 PM  Type of Therapy:  Psychoeducational Skills  Participation Level:  Active  Participation Quality:  Appropriate and Attentive  Affect:  Appropriate  Cognitive:  Appropriate  Insight:  Good  Engagement in Group:  Good  Engagement in Therapy:  Good  Modes of Intervention:  Support  Summary of Progress/Problems: pt. attended and participated. Pt. needed several redirection to focus on group topic. Pt. Was redirectable    Gwenevere Ghazi Patience 11/10/2011, 8:27 PM

## 2011-11-10 NOTE — Progress Notes (Signed)
D:  Patient stayed in bed this morning and did not attend the first couple of groups.  States that the Seroquel dose she received last night was too much.  MD is aware.  She has been a bit tearful today stating she misses her dogs.  Did call the vet today to check on them.  Interacting with peers.  Appetite good.  Denies suicidal ideation or major depressive symptoms.   A:  Encouraged patient to call her vet and check on her animals.  Educated about need to stay here for a few days while we get medications stabilized.   R:  Pleasant and cooperative, but tearful.  Appetite good.  Interacting well with staff and peers.

## 2011-11-10 NOTE — Progress Notes (Signed)
Psychoeducational Group Note  Date:  11/10/2011 Time:  1100  Group Topic/Focus:  Recovery Goals:   The focus of this group is to identify appropriate goals for recovery and establish a plan to achieve them.  Participation Level: Did Not Attend  Participation Quality:  Not Applicable  Affect:  Not Applicable  Cognitive:  Not Applicable  Insight:  Not Applicable  Engagement in Group: Not Applicable  Additional Comments:  Pt was asleep and could not attend group this morning.  Khyler Eschmann E 11/10/2011, 1:57 PM

## 2011-11-10 NOTE — Progress Notes (Signed)
[facility] Group Notes:  (Counselor/Nursing/MHT/Case Management/Adjunct)  11/10/2011 1:23 PM  Type of Therapy:  Group Therapy  Participation Level:  Did Not Attend     Ailana Cuadrado 11/10/2011, 1:23 PM 
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
## 2011-11-11 ENCOUNTER — Encounter (HOSPITAL_COMMUNITY): Payer: Self-pay | Admitting: Behavioral Health

## 2011-11-11 LAB — CBC WITH DIFFERENTIAL/PLATELET
Basophils Absolute: 0.1 10*3/uL (ref 0.0–0.1)
Eosinophils Absolute: 0 10*3/uL (ref 0.0–0.7)
Eosinophils Relative: 0 % (ref 0–5)
Lymphocytes Relative: 42 % (ref 12–46)
Lymphs Abs: 2.4 10*3/uL (ref 0.7–4.0)
MCH: 30.7 pg (ref 26.0–34.0)
MCV: 88.9 fL (ref 78.0–100.0)
Neutrophils Relative %: 47 % (ref 43–77)
Platelets: 251 10*3/uL (ref 150–400)
RBC: 4.33 MIL/uL (ref 3.87–5.11)
RDW: 12.4 % (ref 11.5–15.5)
WBC: 5.8 10*3/uL (ref 4.0–10.5)

## 2011-11-11 LAB — COMPREHENSIVE METABOLIC PANEL WITH GFR
ALT: 36 U/L — ABNORMAL HIGH (ref 0–35)
AST: 36 U/L (ref 0–37)
Albumin: 3.4 g/dL — ABNORMAL LOW (ref 3.5–5.2)
Alkaline Phosphatase: 111 U/L (ref 39–117)
BUN: 21 mg/dL (ref 6–23)
CO2: 25 meq/L (ref 19–32)
Calcium: 9.1 mg/dL (ref 8.4–10.5)
Chloride: 97 meq/L (ref 96–112)
Creatinine, Ser: 0.65 mg/dL (ref 0.50–1.10)
GFR calc Af Amer: >90 mL/min
GFR calc non Af Amer: >90 mL/min
Glucose, Bld: 120 mg/dL — ABNORMAL HIGH (ref 70–99)
Potassium: 4 meq/L (ref 3.5–5.1)
Sodium: 135 meq/L (ref 135–145)
Total Bilirubin: 0.1 mg/dL — ABNORMAL LOW (ref 0.3–1.2)
Total Protein: 7 g/dL (ref 6.0–8.3)

## 2011-11-11 MED ORDER — ACYCLOVIR 200 MG PO CAPS
200.0000 mg | ORAL_CAPSULE | Freq: Every day | ORAL | Status: DC
  Administered 2011-11-12 – 2011-11-13 (×9): 200 mg via ORAL
  Filled 2011-11-11: qty 1
  Filled 2011-11-11: qty 25
  Filled 2011-11-11 (×3): qty 1
  Filled 2011-11-11 (×2): qty 25
  Filled 2011-11-11 (×7): qty 1
  Filled 2011-11-11: qty 25
  Filled 2011-11-11: qty 1
  Filled 2011-11-11: qty 25
  Filled 2011-11-11 (×3): qty 1

## 2011-11-11 MED ORDER — QUETIAPINE FUMARATE 50 MG PO TABS
150.0000 mg | ORAL_TABLET | Freq: Every day | ORAL | Status: DC
  Administered 2011-11-11: 150 mg via ORAL
  Filled 2011-11-11: qty 3
  Filled 2011-11-11: qty 1
  Filled 2011-11-11 (×2): qty 3

## 2011-11-11 MED ORDER — CARBAMAZEPINE ER 400 MG PO TB12
400.0000 mg | ORAL_TABLET | Freq: Every day | ORAL | Status: DC
  Administered 2011-11-11 – 2011-11-12 (×2): 400 mg via ORAL
  Filled 2011-11-11 (×4): qty 1

## 2011-11-11 MED ORDER — CARBAMAZEPINE ER 200 MG PO TB12
200.0000 mg | ORAL_TABLET | Freq: Every day | ORAL | Status: DC
  Administered 2011-11-12 – 2011-11-13 (×2): 200 mg via ORAL
  Filled 2011-11-11: qty 1
  Filled 2011-11-11: qty 42
  Filled 2011-11-11 (×2): qty 1
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
## 2011-11-11 NOTE — Progress Notes (Signed)
S; Informed by NS that patient presented with clo of red irritated bump on her Lt Buttocks adjacent to her coccyx. 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  O: Integument: approx 2 cm red blanching macular eruption on Lt Buttocks without localized red streaks, cellulitis or drainage.  A/P:  1) HSV flair, rx acyclovir 200 mg q 4 hours , five times a day

## 2011-11-11 NOTE — Progress Notes (Signed)
Psychoeducational Group Note  Date:  11/11/2011 Time:  1100  Group Topic/Focus:  Making Healthy Choices:   The focus of this group is to help patients identify negative/unhealthy choices they were using prior to admission and identify positive/healthier coping strategies to replace them upon discharge.  Participation Level:  Minimal  Participation Quality:  Intrusive  Affect:  Anxious  Cognitive:  Oriented  Insight:  Limited  Engagement in Group:  Limited  Additional Comments:  Pt. Was called away to treatment team. Redirectable while in group. interrupting peers were minimal  Kim [name] A 11/11/2011, 12:51 PM

## 2011-11-11 NOTE — Progress Notes (Signed)
11/11/2011         Time: 0930      Group Topic/Focus: The focus of this group is on enhancing the patient's understanding of leisure, barriers to leisure, and the importance of engaging in positive leisure activities upon discharge for improved total health.  Participation Level: Active  Participation Quality: Intrusive and Monopolizing  Affect: Labile  Cognitive: Alert   Additional Comments: Patient very intrusive, talking over peers and became agitated when RT asked her to give others the opportunity to speak.    Kim [name] 11/11/2011 11:49 AM

## 2011-11-11 NOTE — Progress Notes (Signed)
[facility] MD Progress Note  11/11/2011 1:53 PM  Diagnosis:  Axis I:  Bipolar I Disorder - Most Recent Episode - Manic.   Cocaine Abuse.   The patient was seen today and reports the following:   ADL's: Good.  Sleep: The patient reports to sleeping well last night.  Appetite: The patient reports a good appetite today.   Mild>(1-10) >Severe  Hopelessness (1-10): 0  Depression (1-10): 2-3  Anxiety (1-10): 2   Suicidal Ideation: The patient denies any current suicidal ideations today.  Plan: No  Intent: No  Means: No   Homicidal Ideation: The patient denies any current homicidal ideations today.  Plan: No  Intent: No.  Means: No   General Appearance/Behavior: The patient was mild to moderately manic with mild flight of ideas.  Eye Contact: Good.  Speech: Increased in rate and volume with mild to moderate pressuring noted.  Motor Behavior:  Mild agitation.  Level of Consciousness: Alert and Oriented x 3.  Mental Status: Alert and Oriented x 3.  Mood: Depressed - Mild to moderately manic.  Affect: Mild to moderately expansive.  Anxiety Level: Mild anxiety reported.  Thought Process: Appears mildly grandiose today.  Thought Content: The patient denies any auditory or visual hallucinations today. She denies any delusional thinking today.  Perception: Appears mildly grandiose.  Judgment: Fair.  Insight: Fair.  Cognition: Appropriate.  Sleep:  Number of Hours: 6.75    Vital Signs:Blood pressure 121/83, pulse 104, temperature 97.3 F (36.3 C), temperature source Oral, resp. rate 20, height 4' 11.5" (1.511 m), weight 48.535 kg (107 lb).  Current Medications: Current Facility-Administered Medications  Medication Dose Route Frequency Provider Last Rate Last Dose  . acetaminophen (TYLENOL) tablet 650 mg  650 mg Oral Q6H PRN Sanjuana Kava, NP      . alum & mag hydroxide-simeth (MAALOX/MYLANTA) 200-200-20 MG/5ML suspension 30 mL  30 mL Oral Q4H PRN Sanjuana Kava, NP      . carbamazepine  (TEGRETOL XR) 12 hr tablet 200 mg  200 mg Oral Q breakfast Curlene Labrum Shonta Bourque, MD      . carbamazepine (TEGRETOL XR) 12 hr tablet 400 mg  400 mg Oral QHS Curlene Labrum Nathalee Smarr, MD      . ibuprofen (ADVIL,MOTRIN) tablet 600 mg  600 mg Oral TID PRN Sanjuana Kava, NP   600 mg at 11/08/11 2105  . magnesium hydroxide (MILK OF MAGNESIA) suspension 30 mL  30 mL Oral Daily PRN Sanjuana Kava, NP      . QUEtiapine (SEROQUEL) tablet 150 mg  150 mg Oral QHS Curlene Labrum Jearline Hirschhorn, MD      . DISCONTD: carbamazepine (TEGRETOL XR) 12 hr tablet 200 mg  200 mg Oral BH-qamhs Curlene Labrum Rasheida Broden, MD   200 mg at 11/11/11 0815  . DISCONTD: QUEtiapine (SEROQUEL) tablet 100 mg  100 mg Oral QHS Mickie D. Adams, PA   100 mg at 11/09/11 2127  . DISCONTD: QUEtiapine (SEROQUEL) tablet 150 mg  150 mg Oral QHS Verne Spurr, PA-C   150 mg at 11/10/11 2158   Lab Results: No results found for this or any previous visit (from the past 48 hour(s)).  Physical Findings: AIMS: Facial and Oral Movements Muscles of Facial Expression: None, normal Lips and Perioral Area: None, normal Jaw: None, normal Tongue: None, normal,Extremity Movements Upper (arms, wrists, hands, fingers): None, normal Lower (legs, knees, ankles, toes): None, normal, Trunk Movements Neck, shoulders, hips: None, normal, Overall Severity Severity of abnormal movements (highest score from questions  above): None, normal Incapacitation due to abnormal movements: None, normal Patient's awareness of abnormal movements (rate only patient's report): No Awareness, Dental Status Current problems with teeth and/or dentures?: No Does patient usually wear dentures?: No   Review of Systems:  Neurological: The patient denies any headaches today. She denies any seizures or dizziness.  G.I.: The patient denies any constipation today or G.I. Upset.  Musculoskeletal: The patient denies any muscle or skeletal difficulties today.   Time was spent today discussing with the patient her  current symptoms. The patient states that she is sleeping well and again reports a good appetite. She reports mild feelings of sadness, anhedonia and depressed mood and denies any suicidal or homicidal ideations. She also denies any auditory or visual hallucinations or delusional thinking.  She does report mild anxiety symptoms and displays mild to moderate manic symptoms.   The patient is requesting discharge but at this point remains too manic to assure a safe discharge.  Treatment Plan Summary:  1. Daily contact with patient to assess and evaluate symptoms and progress in treatment  2. Medication management  3. The patient will deny suicidal ideations or homicidal ideations for 48 hours prior to discharge and have a depression and anxiety rating of 3 or less. The patient will also deny any auditory or visual hallucinations or delusional thinking and display no manic or hypomanic behaviors.  4. The patient will deny any symptoms of substance withdrawal at time of discharge.   Plan:  1. Will continue the patient on the medication Seroquel at 150 mgs po qhs for mood stabilization and to address her psychosis.  2. Will increase the medication Tegretol XR to 200 mgs po q am and 400 mgs po hs for further mood stabilization.  3. Laboratory Studies reviewed.  4. Will order a serum Tegretol Level as well as a repeat CBC with Diff and CMP tonight. 5. Will continue to monitor.   Kim [name] 11/11/2011, 1:53 PM

## 2011-11-11 NOTE — Progress Notes (Signed)
D: Patient hyper verbal but coherent and cooperative.  Patient has been to the phone several times and had to be asked to let others use the phone.  Patient states she is ready to go home so she can get home to her dog.  Patient states she was able to speak to her dogs on the telephone today and she sang them a song.  Patient has on one pink flip flop and one yellow flip flop.  Patient states this is a new trend.  Patient denies depression and hopelessness.  Patient denies SI/HI and denies AVH.  Rash on coccyx area noted patient states it is herpes or something like it.  Jilda Roche PA notifed A: Staff to monitor Q 15 mins for safety. Support and encouragement offered.  Medications adminitered per orders R: Patient remians safe on the unit. Taking administered medications.

## 2011-11-11 NOTE — Progress Notes (Signed)
[facility] Group Notes:  (Counselor/Nursing/MHT/Case Management/Adjunct)  11/11/2011 4:18 PM  Type of Therapy:  Psychoeducation  Participation Level:  Active  Participation Quality:  Attentive and Sharing  Affect:  Appropriate  Cognitive:  Oriented  Insight:  Limited  Engagement in Group:  Good  Engagement in Therapy:  Good  Modes of Intervention:  Education, Limit-setting and Support  Summary of Progress/Problems: Patient was attentive to speaker from mental health association. She did better in not interrupting the speaker as much as in previous groups but she did have a lot of questions. Most questions were pertinent to the discussion. Interested in some of their resources for herself and others.   Neyland Pettengill, [name] 11/11/2011, 4:18 PM

## 2011-11-11 NOTE — Progress Notes (Signed)
[facility] Case Management Discharge Plan:  Will you be returning to the same living situation after discharge: Yes,  owns home. At discharge, do you have transportation home?:Yes,  friend, Do you have the ability to pay for your medications:Yes,  [name] will assist with medications.  Interagency Information:     Release of information consent forms completed and in the chart;  Patient's signature needed at discharge.  Patient to Follow up at:  Follow-up Information    Follow up with [facility] for Medication management and therapy on 11/17/2011. (Please go to walk-in clinic Monday through Friday between  8 and 9 am for hospital follow-up appointment.  Take all of your discharge paperwork with you along with prescriptions.)    Contact information:   [address]   [place], [place] [address]   [phone]          Patient denies SI/HI:   Yes,  verbalized safety plan    Safety Planning and Suicide Prevention discussed:  Yes,  verbalized understanding  Barrier to discharge identified:No.  Summary and Recommendations: Patient referred to [facility] for outpatient med management and therapy.   Kim [name] 11/11/2011, 1000

## 2011-11-11 NOTE — Tx Team (Addendum)
Interdisciplinary Treatment Plan Update (Adult)  Date: 11/11/2011  Time Reviewed: 1000  Progress in Treatment: Attending groups: Some Participating in groups:  Yes Taking medication as prescribed: Yes Tolerating medication:  Yes Family/Significant othe contact made:  Yes with Nephew. Patient understands diagnosis:  Yes Discussing patient identified problems/goals with staff:  Yes Medical problems stabilized or resolved:  Yes Denies suicidal/homicidal ideation: Yes Issues/concerns per patient self-inventory:  None identified Other: N/A  New problem(s) identified: None Identified  Reason for Continuation of Hospitalization: Mania Medication stabilization   Interventions implemented related to continuation of hospitalization: mood stabilization, medication monitoring and adjustment, group therapy and psycho education, safety checks q 15 mins  Additional comments: Started on Tegretol XR today for mood stabilization.  Estimated length of stay: 1-2 days  Discharge Plan: Patient will follow-up at [facility] for med management and therapy.   New goal(s): N/A  Review of initial/current patient goals per problem list:   1.  Goal(s): Reduce depressive symptoms  Met:  Yes  As evidenced by: Reducing depression from a 10 to a 2-3 as reported by pt.   2.  Goal (s): Eliminate Suicidal Ideation  Met:  Yes  As evidenced by: Eliminate suicidal ideation.   3.  Goal(s): Reduce Psychosis  Met:  Yes  As evidenced by: Reduce psychotic symptoms to baseline, as reported by pt.     Attendees: Patient:  Kim [name] 11/11/2011 1000  Family:   Physician:  Franchot Gallo, MD 11/11/2011 1000  Nursing:   Abbie Sons RN 11/11/2011 1000  Case Manager:  Barrie Folk RN 11/11/2011 1000  Counselor:  Veto Kemps, MT-BC   Other:  Shann Medal RN 11/11/2011 1000  Other:     Other:     Other:      Scribe for Treatment Team:   Barrie Folk 11/11/2011 1000

## 2011-11-11 NOTE — Progress Notes (Signed)
Pt is hyperverbal and manic. Pt reports that September is the anniversary of her parent's death and it is a sad time of the year for her. She talked about missing her dogs and wanting to go home and see them. Supported pt to discuss feelings. Offered encouragement and redirection. Pt denies any si or hi thoughts. Safety maintained on unit.

## 2011-11-12 MED ORDER — HALOPERIDOL LACTATE 5 MG/ML IJ SOLN: 5.0000 mg | Freq: Four times a day (QID) | INTRAMUSCULAR | Status: DC | PRN

## 2011-11-12 MED ORDER — QUETIAPINE FUMARATE 200 MG PO TABS
ORAL_TABLET | ORAL | Status: AC
  Filled 2011-11-12: qty 1

## 2011-11-12 MED ORDER — LORAZEPAM 2 MG/ML IJ SOLN: 1.0000 mg | Freq: Four times a day (QID) | INTRAMUSCULAR | Status: DC | PRN

## 2011-11-12 MED ORDER — QUETIAPINE FUMARATE 100 MG PO TABS
100.0000 mg | ORAL_TABLET | Freq: Once | ORAL | Status: AC
  Administered 2011-11-13: 100 mg via ORAL
  Filled 2011-11-12: qty 1

## 2011-11-12 MED ORDER — QUETIAPINE FUMARATE 300 MG PO TABS
300.0000 mg | ORAL_TABLET | Freq: Every day | ORAL | Status: DC
  Administered 2011-11-12: 300 mg via ORAL
  Filled 2011-11-12 (×2): qty 1

## 2011-11-12 MED ORDER — QUETIAPINE FUMARATE 200 MG PO TABS
200.0000 mg | ORAL_TABLET | Freq: Once | ORAL | Status: AC
  Administered 2011-11-12: 200 mg via ORAL
  Filled 2011-11-12: qty 1

## 2011-11-12 MED ORDER — LORAZEPAM 1 MG PO TABS
1.0000 mg | ORAL_TABLET | Freq: Four times a day (QID) | ORAL | Status: DC | PRN
  Administered 2011-11-12 (×2): 1 mg via ORAL
  Filled 2011-11-12 (×2): qty 1

## 2011-11-12 MED ORDER — HALOPERIDOL 5 MG PO TABS
5.0000 mg | ORAL_TABLET | Freq: Four times a day (QID) | ORAL | Status: DC | PRN
  Filled 2011-11-12: qty 1

## 2011-11-12 NOTE — Progress Notes (Signed)
Patient is agitated and angry this am.  She has made several telephone calls requesting to be discharged this morning.  She has requested to see MD.  She has angrily slammed the telephone down, pacing up and down the hallways, verbally talking loudly, talking to someone about being kept here against her will.  She stated, "I'm getting a court order that I'm being held here again my will and that I have to be discharged today."  She would not listen to staff and be deescalated.  She was verbally abusive and was ranting about staff and MD in the Day Room which as agitating the other patients.  Her adopted nephew (Kim [name])  has now called the unit and has requested that she be discharged. He cussed and demanded that she be able to leave.  Patient denies any SI/HI/AVH.  Continue to monitor aggession and mood.  Administer ativan and haldol as needed.  Patient is calm and cooperative at this time.

## 2011-11-12 NOTE — Progress Notes (Signed)
Patient ID: Kim [name], female   DOB: [birth year], 51 y.o.   MRN: [id number] Made a call to patient's friend Eunice Blase. She called back and told counselor there were only certain times she could call because her battery was dying. She called later at 7:30 on 82/8 and asked why she had not been called back. Counselor called her at 8:00 am this morning and told her that we had talked to a family member and did not need to talk to her currently. Also did not give this information to friend, but patient had decided that she didn't want [place] talking to her friend.

## 2011-11-12 NOTE — Progress Notes (Signed)
[facility] Group Notes:  (Counselor/Nursing/MHT/Case Management/Adjunct)  11/12/2011 11:42 AM  Type of Therapy:  Group Therapy  Participation Level:  Active  Participation Quality:  Attentive, Intrusive and Sharing  Affect:  Angry  Cognitive:  Oriented  Insight:  Limited  Engagement in Group:  Good  Engagement in Therapy:  Good  Modes of Intervention:  Education, Limit-setting and Problem-solving  Summary of Progress/Problems: Patient into group late. She was angry because she was trying to tell how she was feeling, even though she admitted to a "temper tantrum", and they just made her "pop a pill". She was rude and demanding that she leave the hospital today to do banking and a list full of thins before going on her trip. When given feedback about the bank being open tomorrow, she stated that counselor had no business telling her anything and again demanded to leave.   Dam Ashraf, [name] 11/12/2011, 11:42 AM

## 2011-11-12 NOTE — Progress Notes (Signed)
Psychoeducational Group Note  Date:  11/12/2011 Time:  1100  Group Topic/Focus:  Rediscovering Joy:   The focus of this group is to explore various ways to relieve stress in a positive manner.  Participation Level:  None  Participation Quality:  Intrusive, Monopolizing and Resistant  Affect:  Blunted and Irritable  Cognitive:  Oriented  Insight:  None  Engagement in Group:  None  Additional Comments:  Pt was extremely disruptive in group and as a result had to be asked to excused herself from the group this morning.  Kim [name] E 11/12/2011, 1:16 PM

## 2011-11-12 NOTE — Care Management (Signed)
Patient attended After-care planning group. Group discussed Leisure and Life Style activities as they relate to rediscovering joy, overcoming stress and self-esteem. Patient was hyper-verbal, intrusive but redirectable. Patient focused on acrimonious relationship between herself and her neighbors. Patient eager for discharge with plan to access services through [facility]  Joice Lofts RN MS EdS 11/12/2011  4:33 PM

## 2011-11-12 NOTE — Progress Notes (Signed)
[facility] In Patient Progress Note  11/12/2011 5:18 PM Kim [name] 08-20-1960 [id number] Diagnosis:  Axis I: Bipolar disorder most recent episode manic  ADL's:  Intact  Sleep:  Yes,  AEB:  Appetite: ?  Suicidal Ideation: No suicidal ideation, no plan, no intent, no means.  Homicidal Ideation:  No homicidal ideation, no plan, no intent, no means.  Subjective:  The patient has continued to escalate through out this morning. She has been speaking loudly, monopolizing the phone, intrusive with others, and demanding of the staff. She is physically agitated, slams the phone down, slams the door to her room and can not be redirected.  height is 4' 11.5" (1.511 m) and weight is 52.617 kg (116 lb). Her oral temperature is 97 F (36.1 C). Her blood pressure is 120/79 and her pulse is 109. Her respiration is 18.  Objective: Patient's speech is pressured, verbally abusive and profane toward this provider. She demands to be discharged. Her family has been calling the unit and is also verbally aggressive and profane as well.  Mental Status: drowsy after medication Orientation: x ? General Appearance : Behavior:  Disheveled Eye Contact:  Minimal Motor Behavior:  Restlestness Speech:  Pressured Level of Consciousness:  drowsy Mood:  Angry and Irritable Affect:  Inappropriate, Labile and Tearful Anxiety Level:  Moderate Thought Process:  Loose Thought Content:  Obsessions over her pets. Perception:   Judgment:  Poor Insight:  Absent Cognition:   Sleep:  Number of Hours: 6.25   Lab Results:  Results for orders placed during the hospital encounter of 11/08/11 (from the past 48 hour(s))  COMPREHENSIVE METABOLIC PANEL     Status: Abnormal   Collection Time   11/11/11  7:20 PM      Component Value Range Comment   Sodium 135  135 - 145 mEq/L    Potassium 4.0  3.5 - 5.1 mEq/L    Chloride 97  96 - 112 mEq/L    CO2 25  19 - 32 mEq/L    Glucose, Bld 120 (*) 70 - 99 mg/dL    BUN 21  6 - 23 mg/dL      Creatinine, Ser 4.09  0.50 - 1.10 mg/dL    Calcium 9.1  8.4 - 81.1 mg/dL    Total Protein 7.0  6.0 - 8.3 g/dL    Albumin 3.4 (*) 3.5 - 5.2 g/dL    AST 36  0 - 37 U/L    ALT 36 (*) 0 - 35 U/L    Alkaline Phosphatase 111  39 - 117 U/L    Total Bilirubin 0.1 (*) 0.3 - 1.2 mg/dL    GFR calc non Af Amer >90  >90 mL/min    GFR calc Af Amer >90  >90 mL/min   CARBAMAZEPINE LEVEL, TOTAL     Status: Normal   Collection Time   11/11/11  7:20 PM      Component Value Range Comment   Carbamazepine Lvl 4.7  4.0 - 12.0 ug/mL   CBC WITH DIFFERENTIAL     Status: Normal   Collection Time   11/11/11  7:20 PM      Component Value Range Comment   WBC 5.8  4.0 - 10.5 K/uL    RBC 4.33  3.87 - 5.11 MIL/uL    Hemoglobin 13.3  12.0 - 15.0 g/dL    HCT 91.4  78.2 - 95.6 %    MCV 88.9  78.0 - 100.0 fL    MCH 30.7  26.0 -  34.0 pg    MCHC 34.5  30.0 - 36.0 g/dL    RDW 96.0  45.4 - 09.8 %    Platelets 251  150 - 400 K/uL    Neutrophils Relative 47  43 - 77 %    Neutro Abs 2.8  1.7 - 7.7 K/uL    Lymphocytes Relative 42  12 - 46 %    Lymphs Abs 2.4  0.7 - 4.0 K/uL    Monocytes Relative 10  3 - 12 %    Monocytes Absolute 0.6  0.1 - 1.0 K/uL    Eosinophils Relative 0  0 - 5 %    Eosinophils Absolute 0.0  0.0 - 0.7 K/uL    Basophils Relative 1  0 - 1 %    Basophils Absolute 0.1  0.0 - 0.1 K/uL    Labs are reviewed. AST increased, Glucose is increased Physical Findings: AIMS: Facial and Oral Movements Muscles of Facial Expression: None, normal Lips and Perioral Area: None, normal Jaw: None, normal Tongue: None, normal,Extremity Movements Upper (arms, wrists, hands, fingers): None, normal Lower (legs, knees, ankles, toes): None, normal, Trunk Movements Neck, shoulders, hips: None, normal, Overall Severity Severity of abnormal movements (highest score from questions above): None, normal Incapacitation due to abnormal movements: None, normal Patient's awareness of abnormal movements (rate only patient's  report): No Awareness, Dental Status Current problems with teeth and/or dentures?: No Does patient usually wear dentures?: No  CIWA:    COWS:     Medication: . acyclovir  200 mg Oral 5 X Daily  . carbamazepine  200 mg Oral Q breakfast  . carbamazepine  400 mg Oral QHS  . QUEtiapine  300 mg Oral QHS  . DISCONTD: QUEtiapine  150 mg Oral QHS   Treatment Plan Summary: Daily contact with patient to assess and evaluate symptoms and progress in treatment Medication management  Plan: Discussion with Dr. Allena Katz regarding the patient 's response to medication and the following changes were made. 1. Continue the carbamazepine as written. 2. Serouquel is increased to 300mg  for her mania. [name]. Kenard Morawski PAC 11/12/2011, 5:18 PM

## 2011-11-12 NOTE — Progress Notes (Signed)
[facility] Group Notes:  (Counselor/Nursing/MHT/Case Management/Adjunct)  11/12/2011 2:07 PM  Type of Therapy:  Music Therapy  Participation Level:  Did Not Attend     Veto Kemps 11/12/2011, 2:07 PM

## 2011-11-13 MED ORDER — QUETIAPINE FUMARATE 50 MG PO TABS: 150.0000 mg | ORAL_TABLET | Freq: Every day | ORAL | Status: DC

## 2011-11-13 MED ORDER — ACYCLOVIR 200 MG PO CAPS: 200.0000 mg | ORAL_CAPSULE | Freq: Every day | ORAL | Status: AC

## 2011-11-13 MED ORDER — QUETIAPINE FUMARATE 50 MG PO TABS
150.0000 mg | ORAL_TABLET | Freq: Every day | ORAL | Status: DC
  Filled 2011-11-13: qty 42

## 2011-11-13 MED ORDER — CARBAMAZEPINE ER 200 MG PO TB12: ORAL_TABLET | ORAL | Status: DC
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
## 2011-11-13 NOTE — Progress Notes (Signed)
See d/c note

## 2011-11-13 NOTE — Progress Notes (Signed)
Patient ID: Kim [name], female   DOB: 1960/12/29, 51 y.o.   MRN: [id number] Nursing d/c note: Patient hypomanic at d/c but verbalized understanding of all medication and f/u instruction.  Denies SI/HI. 1400 medications given. All belongings returned from locker #20 and patient verified contents.  Escorted to lobby to care of friend.

## 2011-11-13 NOTE — Progress Notes (Signed)
Psychoeducational Group Note  Date:  11/13/2011 Time: 2000  Group Topic/Focus:  Karaoke   Participation Level:  Minimal  Participation Quality:  Appropriate  Affect:  Appropriate  Cognitive:  Appropriate  Insight:  Limited  Engagement in Group:  None  Additional Comments:   Pt attended karaoke this evening, pt didn't participated in group.  Shafer Swamy A 11/13/2011, 12:45 AM
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
## 2011-11-13 NOTE — Progress Notes (Signed)
Psychoeducational Group Note  Date:  11/13/2011 Time:  1100  Group Topic/Focus:  Relapse Prevention Planning:   The focus of this group is to define relapse and discuss the need for planning to combat relapse.  Participation Level:  Active  Participation Quality:  Appropriate, Drowsy and Sharing  Affect:  Anxious and Appropriate  Cognitive:  Appropriate  Insight:  Good  Engagement in Group:  Good  Additional Comments:  none  Deron Poole M 11/13/2011, 1:41 PM

## 2011-11-13 NOTE — Progress Notes (Signed)
11/13/2011         Time: 0930      Group Topic/Focus: The focus of the group is on enhancing the patients' ability to utilize positive relaxation strategies by practicing several that can be used at discharge.  Participation Level: None  Participation Quality: Resistant  Affect: Irritable  Cognitive: Alert   Additional Comments: Patient came to group with her blanket and pillow, laid down and reported the doctor had given her too much medication and she needed to sleep. Patient appeared to be asleep the entire group. As soon as group ended, patient sat up and demanded that RT give her some ice cream. RT explained it wasn't snack time yet and she would need to wait, patient reported she didn't care what RT said because she needed it. Patient was not given ice cream.    Krystyn Picking 11/13/2011 10:16 AM

## 2011-11-13 NOTE — Progress Notes (Signed)
[facility] Case Management Discharge Plan:  Will you be returning to the same living situation after discharge: Yes,  private residence At discharge, do you have transportation home?:Yes,  taxi Do you have the ability to pay for your medications:Yes,  [name] will assist with medications or No.  Interagency Information:     Release of information consent forms completed and in the chart;  Patient's signature needed at discharge.  Patient to Follow up at:  Follow-up Information    Follow up with [facility] for Medication management and therapy on 11/17/2011. (Please go to walk-in clinic Monday through Friday between  8 and 9 am for hospital follow-up appointment.  Take all of your discharge paperwork with you along with prescriptions.)    Contact information:   [address]   [place], [place] [address]   [phone]          Patient denies SI/HI:   Yes,  verbalized safety plan    Safety Planning and Suicide Prevention discussed:  Yes,  verbalized understanding  Barrier to discharge identified:No.  Summary and Recommendations: Eager for discharge. Verbalized understanding that she can access services through [facility] assessment, 911, local ED.   Kim [name] 11/13/2011, 10:53 AM

## 2011-11-13 NOTE — Progress Notes (Signed)
[facility] Group Notes:  (Counselor/Nursing/MHT/Case Management/Adjunct)  11/13/2011 2:07 PM  Type of Therapy:  Group Therapy  Participation Level:  Did Not Attend    Kim [name] 11/13/2011, 2:07 PM 

## 2011-11-13 NOTE — Tx Team (Signed)
Interdisciplinary Treatment Plan Update (Adult)  Date: 11/13/2011  Time Reviewed: 10:49 AM   Progress in Treatment: Attending groups: Yes Participating in groups:  Yes Taking medication as prescribed: Yes Tolerating medication:  Yes Family/Significant othe contact made:  Nephew Patient understands diagnosis:  Yes Discussing patient identified problems/goals with staff:  Yes Medical problems stabilized or resolved:  Yes Denies suicidal/homicidal ideation: Yes Issues/concerns per patient self-inventory:  None identified Other: N/A  New problem(s) identified: None Identified   Interventions implemented related to continuation of hospitalization: mood stabilization, medication monitoring and adjustment, group therapy and psycho education, safety checks q 15 mins  Additional comments: N/A  Estimated length of stay: Discharge today  Discharge Plan: Will follow-up with [facility] for Medication management and therapy.  New goal(s): N/A  Review of initial/current patient goals per problem list:   1.  Goal(s): Reduce depressive symptoms  Met:  Yes  As evidenced by: Reducing depression from a 10 to a 3 as reported by pt.   2.  Goal (s): Eliminate Suicidal Ideation  Met:  Yes  Target date: by discharge  As evidenced by: Eliminate suicidal ideation.   3.  Goal(s): Reduce Psychosis  Met:  Yes  Target date: by discharge  As evidenced by: Reduce psychotic symptoms to baseline, as reported by pt.     Attendees: Patient:  Kim [name] 11/13/2011 10:47 AM   Family:     Physician:  Franchot Gallo, MD 11/13/2011 10:47 AM   Nursing:      Case Manager:  Barrie Folk RN MS EdS 11/13/2011 10:47 AM   Counselor:  Veto Kemps, MT-BC 11/13/2011 10:47 AM   Other:  Waynetta Sandy 11/13/2011 10:47 AM   Other:  Donneta Romberg RN 11/13/2011 10:52 AM   Other:     Other:      Scribe for Treatment Team:   [facility] 11/13/2011  10:47 AM

## 2011-11-17 NOTE — Progress Notes (Signed)
Patient Discharge Instructions:  After Visit Summary (AVS):   Faxed to:  11/17/2011 Psychiatric Admission Assessment Note:   Faxed to:  11/17/2011 Suicide Risk Assessment - Discharge Assessment:   Faxed to:  11/17/2011 Faxed/Sent to the Next Level Care provider:  11/17/2011  Faxed to [facility] @ [phone]  Heloise Purpura, Eduard Clos, 11/17/2011, 5:15 PM
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
## 2012-07-14 ENCOUNTER — Ambulatory Visit (HOSPITAL_COMMUNITY)
Admission: AD | Admit: 2012-07-14 | Discharge: 2012-07-14 | Disposition: A | Discharge status: Home / Self Care | Payer: Federal, State, Local not specified - Other | Attending: Psychiatry | Admitting: Psychiatry

## 2012-07-14 DIAGNOSIS — F3130 Bipolar disorder, current episode depressed, mild or moderate severity, unspecified: Secondary | ICD-10-CM | POA: Insufficient documentation

## 2012-07-14 NOTE — H&P (Signed)
Behavioral Health Medical Screening Exam  Kim [name] is an 52 y.o. female.  Review of Systems  Constitutional: Negative.  Negative for fever, chills, weight loss, malaise/fatigue and diaphoresis.  HENT: Negative for congestion and sore throat.   Eyes: Negative for blurred vision, double vision and photophobia.  Respiratory: Negative for cough, shortness of breath and wheezing.   Cardiovascular: Negative for chest pain, palpitations and PND.  Gastrointestinal: Negative for heartburn, nausea, vomiting, abdominal pain, diarrhea and constipation.  Musculoskeletal: Negative for myalgias, joint pain and falls.  Neurological: Negative for dizziness, tingling, tremors, sensory change, speech change, focal weakness, seizures, loss of consciousness, weakness and headaches.  Endo/Heme/Allergies: Negative for polydipsia. Does not bruise/bleed easily.  Psychiatric/Behavioral: Negative for depression, suicidal ideas, hallucinations, memory loss and substance abuse. The patient is not nervous/anxious and does not have insomnia.     Physical Exam  Constitutional: She is oriented to person, place, and time. She appears well-developed and well-nourished.  HENT:  Head: Normocephalic and atraumatic.  Eyes: Conjunctivae and EOM are normal.  Neck: Normal range of motion. Neck supple. No thyromegaly present.  Cardiovascular: Normal rate, regular rhythm and intact distal pulses.  Exam reveals friction rub. Exam reveals no gallop.   No murmur heard. Respiratory: Effort normal and breath sounds normal.  GI: Soft. Bowel sounds are normal.  Musculoskeletal: Normal range of motion.  Neurological: She is alert and oriented to person, place, and time. She has normal reflexes. No cranial nerve deficit.  Skin: Skin is warm and dry.  Psychiatric: She has a normal mood and affect. Her behavior is normal. Judgment and thought content normal.    There were no vitals taken for this visit.  Recommendations:  Based  on my evaluation the patient does not appear to have an emergency medical condition.  Kim [name] 07/14/2012, 11:22 AM

## 2012-07-14 NOTE — BH Assessment (Signed)
Assessment Note   Kim [name] is an 52 y.o. female   Pt [facility] stating she was released from jail yesterday and immediately went to get crack/cocaine.  She smoked about 200.00 worth.  She reports now she needs some mental health help. Pt denies s/i, h/i, and is not psychotic. Pt was seen by Verne Spurr who completed the MSE.    Marland Kitchen   Axis I: Bipolar, Depressed Axis II: Deferred Axis III:  Past Medical History  Diagnosis Date  . Headache   . ADHD (attention deficit hyperactivity disorder)   . Herpes 1986    on patients back side since 1986   Axis IV: other psychosocial or environmental problems, problems related to social environment and problems with primary support group Axis V: 51-60 moderate symptoms     Past Medical History:  Past Medical History  Diagnosis Date  . Headache   . ADHD (attention deficit hyperactivity disorder)   . 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 Knee surgery      right    Family History: No family history on file.  Social History:  reports that she has never smoked. She does not have any smokeless tobacco history on file. She reports that she uses illicit drugs (Cocaine). She reports that she does not drink alcohol.  Additional Social History:  Alcohol / Drug Use Pain Medications: na Prescriptions: na Over the Counter: na History of alcohol / drug use?: Yes Substance #1 Name of Substance 1: crack/cocaine 1 - Age of First Use: 35 1 - Amount (size/oz): 200.00 1 - Frequency: daily 1 - Duration: 4 years 1 - Last Use / Amount: 07/14/11  CIWA:   COWS:    Allergies:  Allergies  Allergen Reactions  . Penicillins     Unknown reaction.    Home Medications:  (Not in a hospital admission)  OB/GYN Status:  No LMP recorded. Patient has had an ablation.  General Assessment Data Location of Assessment: [facility] Assessment Services Living Arrangements:  Alone Can pt return to current living arrangement?: Yes Admission Status: Voluntary Is patient capable of signing voluntary admission?: Yes Transfer from: Home Referral Source: Self/Family/Friend  Education Status Contact person: Trey Paula Spencer-[phone]  Risk to self Suicidal Ideation: No Suicidal Intent: No Is patient at risk for suicide?: No Suicidal Plan?: No Access to Means: No What has been your use of drugs/alcohol within the last 12 months?: crack/cocaine Previous Attempts/Gestures: No How many times?: 0 Other Self Harm Risks: na Triggers for Past Attempts: None known Intentional Self Injurious Behavior: None Family Suicide History: Unknown Recent stressful life event(s): Other (Comment) (in jail for 53 days) Persecutory voices/beliefs?: No Depression: Yes Depression Symptoms: Loss of interest in usual pleasures;Feeling worthless/self pity Substance abuse history and/or treatment for substance abuse?: Yes Suicide prevention information given to non-admitted patients: Yes  Risk to Others Homicidal Ideation: No Thoughts of Harm to Others: No Current Homicidal Intent: No Current Homicidal Plan: No Access to Homicidal Means: No Identified Victim: na History of harm to others?: No Assessment of Violence: None Noted Violent Behavior Description: na Does patient have access to weapons?: No Criminal Charges Pending?: Yes Describe Pending Criminal Charges: restraining order Does patient have a court date: Yes Court Date: 07/22/12  Psychosis Hallucinations: None noted Delusions: None noted  Mental Status Report Appear/Hygiene: Disheveled Eye Contact: Good Motor Activity: Freedom of movement Speech: Logical/coherent Level of Consciousness: Alert  Mood: Depressed;Guilty;Helpless;Despair Affect: Depressed;Sad Anxiety Level: Minimal Thought Processes: Coherent;Relevant Judgement: Unimpaired Orientation: Person;Place;Time;Situation Obsessive Compulsive  Thoughts/Behaviors: None  Cognitive Functioning Concentration: Normal Memory: Recent Intact;Remote Intact IQ: Average Insight: Poor Impulse Control: Poor Appetite: Good Weight Loss: 0 Weight Gain: 0 Sleep: No Change Total Hours of Sleep: 7 Vegetative Symptoms: None  ADLScreening [facility] Assessment Services) Patient's cognitive ability adequate to safely complete daily activities?: Yes Patient able to express need for assistance with ADLs?: Yes Independently performs ADLs?: Yes (appropriate for developmental age)  Abuse/Neglect [facility]) Physical Abuse: Denies Verbal Abuse: Denies Sexual Abuse: Denies  Prior Inpatient Therapy Prior Inpatient Therapy: Yes Prior Therapy Dates: 2013 [facility]Provider[facility] Reason for Treatment: bipolar  Prior Outpatient Therapy Prior Outpatient Therapy: Yes Prior Therapy Dates: 2013 Prior Therapy Facilty/Provider(s): dr Evelene Croon Reason for Treatment: bipolar  ADL Screening (condition at time of admission) Patient's cognitive ability adequate to safely complete daily activities?: Yes Patient able to express need for assistance with ADLs?: Yes Independently performs ADLs?: Yes (appropriate for developmental age) Weakness of Legs: None Weakness of Arms/Hands: None  Home Assistive Devices/Equipment Home Assistive Devices/Equipment: None  Therapy Consults (therapy consults require a physician order) PT Evaluation Needed: No OT Evalulation Needed: No SLP Evaluation Needed: No Abuse/Neglect Assessment (Assessment to be complete while patient is alone) Physical Abuse: Denies Verbal Abuse: Denies Sexual Abuse: Denies Exploitation of patient/patient's resources: Denies Self-Neglect: Denies Values / Beliefs Cultural Requests During Hospitalization: None Spiritual Requests During Hospitalization: None Consults Spiritual Care Consult Needed: No Social Work Consult Needed: No Merchant navy officer (For Healthcare) Advance Directive:  Patient does not have advance directive;Patient would not like information Pre-existing out of facility DNR order (yellow form or pink MOST form): No    Additional Information 1:1 In Past 12 Months?: No CIRT Risk: No Elopement Risk: No Does patient have medical clearance?: No     Disposition:No criteria for inpt tx per Verne Spurr who also did the pt's mse. Disposition Initial Assessment Completed for this Encounter: Yes Disposition of Patient: Referred to ([facility]) Patient referred to: Other (Comment) Museum/gallery curator and ads)  On Site Evaluation by:   Reviewed with Physician:     Hattie Perch Winford 07/14/2012 3:01 PM

## 2014-09-26 ENCOUNTER — Emergency Department (HOSPITAL_COMMUNITY)
Admission: EM | Admit: 2014-09-26 | Discharge: 2014-09-30 | Disposition: A | Discharge status: Other Acute Inpatient Hospital | Payer: No Typology Code available for payment source | Attending: Emergency Medicine | Admitting: Emergency Medicine

## 2014-09-26 ENCOUNTER — Encounter (HOSPITAL_COMMUNITY): Payer: Self-pay | Admitting: Emergency Medicine

## 2014-09-26 DIAGNOSIS — Z8619 Personal history of other infectious and parasitic diseases: Secondary | ICD-10-CM | POA: Insufficient documentation

## 2014-09-26 DIAGNOSIS — Z880 Allergy status to penicillin: Secondary | ICD-10-CM | POA: Insufficient documentation

## 2014-09-26 DIAGNOSIS — F3174 Bipolar disorder, in full remission, most recent episode manic: Secondary | ICD-10-CM | POA: Insufficient documentation

## 2014-09-26 DIAGNOSIS — Z79899 Other long term (current) drug therapy: Secondary | ICD-10-CM | POA: Insufficient documentation

## 2014-09-26 DIAGNOSIS — F1410 Cocaine abuse, uncomplicated: Secondary | ICD-10-CM | POA: Insufficient documentation

## 2014-09-26 DIAGNOSIS — F3112 Bipolar disorder, current episode manic without psychotic features, moderate: Secondary | ICD-10-CM | POA: Diagnosis present

## 2014-09-26 LAB — COMPREHENSIVE METABOLIC PANEL
ALK PHOS: 72 U/L (ref 38–126)
ALT: 25 U/L (ref 14–54)
ANION GAP: 8 (ref 5–15)
AST: 31 U/L (ref 15–41)
Albumin: 4.2 g/dL (ref 3.5–5.0)
BUN: 27 mg/dL — AB (ref 6–20)
CO2: 23 mmol/L (ref 22–32)
Calcium: 9.2 mg/dL (ref 8.9–10.3)
Chloride: 110 mmol/L (ref 101–111)
Creatinine, Ser: 0.84 mg/dL (ref 0.44–1.00)
Glucose, Bld: 98 mg/dL (ref 65–99)
Potassium: 3.4 mmol/L — ABNORMAL LOW (ref 3.5–5.1)
Sodium: 141 mmol/L (ref 135–145)
Total Bilirubin: 0.6 mg/dL (ref 0.3–1.2)
Total Protein: 7.4 g/dL (ref 6.5–8.1)

## 2014-09-26 LAB — RAPID URINE DRUG SCREEN, HOSP PERFORMED
AMPHETAMINES: NOT DETECTED
Barbiturates: NOT DETECTED
Benzodiazepines: NOT DETECTED
COCAINE: POSITIVE — AB
Opiates: NOT DETECTED
TETRAHYDROCANNABINOL: NOT DETECTED

## 2014-09-26 LAB — CBC
HCT: 38.9 % (ref 36.0–46.0)
HEMOGLOBIN: 13 g/dL (ref 12.0–15.0)
MCH: 31.4 pg (ref 26.0–34.0)
MCHC: 33.4 g/dL (ref 30.0–36.0)
MCV: 94 fL (ref 78.0–100.0)
Platelets: 298 10*3/uL (ref 150–400)
RBC: 4.14 MIL/uL (ref 3.87–5.11)
RDW: 13.6 % (ref 11.5–15.5)
WBC: 7 10*3/uL (ref 4.0–10.5)

## 2014-09-26 LAB — SALICYLATE LEVEL

## 2014-09-26 LAB — ACETAMINOPHEN LEVEL: Acetaminophen (Tylenol), Serum: <10 ug/mL — ABNORMAL LOW (ref 10–30)

## 2014-09-26 LAB — ETHANOL: Alcohol, Ethyl (B): <5 mg/dL (ref <5)

## 2014-09-26 MED ORDER — LORAZEPAM 0.5 MG PO TABS
1.0000 mg | ORAL_TABLET | Freq: Three times a day (TID) | ORAL | Status: DC | PRN
  Administered 2014-09-26 – 2014-09-27 (×2): 1 mg via ORAL
  Filled 2014-09-26 (×2): qty 2

## 2014-09-26 MED ORDER — TRAZODONE HCL 50 MG PO TABS
50.0000 mg | ORAL_TABLET | Freq: Every day | ORAL | Status: DC
  Administered 2014-09-26 – 2014-09-29 (×4): 50 mg via ORAL
  Filled 2014-09-26 (×4): qty 1

## 2014-09-26 MED ORDER — HYDROXYZINE HCL 25 MG PO TABS
50.0000 mg | ORAL_TABLET | Freq: Three times a day (TID) | ORAL | Status: DC | PRN
  Administered 2014-09-26 – 2014-09-27 (×2): 50 mg via ORAL
  Filled 2014-09-26 (×2): qty 2

## 2014-09-26 NOTE — ED Notes (Signed)
Pt AAO x 3, cooperative but slightly anxious.  Denies SI or HI, monitoring for safety, Q 15 min checks in effect.
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
## 2014-09-26 NOTE — ED Provider Notes (Signed)
CSN: [id number]     Arrival date & time 09/26/14  1403 History   First MD Initiated Contact with Patient 09/26/14 1415     Chief Complaint  Patient presents with  . IVC      (Consider location/radiation/quality/duration/timing/severity/associated sxs/prior Treatment) HPI Comments: Patient with a long history of ADHD, mental illness who has had multiple encounters with police and was recently incarcerated till June for DWI. Please see brother have pages of incidents and multiple text messages that are bizarre to various people. Patient states that her neighbors and her excessive then make her mad and then she lashes out. She has been wearing G strings on her head, her arms and hanging them in her tree because she feels like they are every anywhere except someone's butt. She whispers often when talking like she doesn't one someone to hear but denies any suicidal or homicidal ideation. Patient denies using any drugs at this time however police that she bent over $2.5 million on heroin and cocaine. Patient over and over again when speaking with her just keeps saying she is not crazy she's just created.  The history is provided by the patient and the police.    Past Medical History  Diagnosis Date  . Headache(784.0)   . ADHD (attention deficit hyperactivity disorder)   . Herpes 1986    on patients back side since 1986   Past Surgical History  Procedure Laterality Date  . Knee surgery      right   No family history on file. History  Substance Use Topics  . Smoking status: Never Smoker   . Smokeless tobacco: Not on file  . Alcohol Use: No     Comment: "every 6 mos"   OB History    No data available     Review of Systems  All other systems reviewed and are negative.     Allergies  Penicillins  Home Medications   Prior to Admission medications   Medication Sig Start Date End Date Taking? Authorizing Provider  Aspirin-Acetaminophen-Caffeine (EXCEDRIN MIGRAINE PO) Take 2  tablets by mouth daily as needed (headache).   Yes Historical Provider, MD  calcium carbonate (TUMS - DOSED IN MG ELEMENTAL CALCIUM) 500 MG chewable tablet Chew 2-3 tablets by mouth daily as needed for heartburn.   Yes Historical Provider, MD  doxepin (SINEQUAN) 25 MG capsule Take 25 mg by mouth daily.   Yes Historical Provider, MD  FLUoxetine (PROZAC) 20 MG capsule Take 60 mg by mouth daily.   Yes Historical Provider, MD  ibuprofen (ADVIL,MOTRIN) 200 MG tablet Take 400 mg by mouth every 6 (six) hours as needed for moderate pain.   Yes Historical Provider, MD  RaNITidine HCl (ZANTAC PO) Take 1 tablet by mouth daily as needed (heartburn).   Yes Historical Provider, MD  carbamazepine (TEGRETOL XR) 200 MG 12 hr tablet Take 1 tablet (200 mg) in the am and 2 tablets (400mg ) at bedtime for mood Patient not taking: Reported on 09/26/2014 11/13/11   Milana Huntsman Readling, MD  QUEtiapine (SEROQUEL) 50 MG tablet Take 3 tablets (150 mg total) by mouth at bedtime. For thoughts/mood Patient not taking: Reported on 09/26/2014 11/13/11 12/13/11  Milana Huntsman Readling, MD   There were no vitals taken for this visit. Physical Exam  Constitutional: She is oriented to person, place, and time. She appears well-developed and well-nourished. No distress.  HENT:  Head: Normocephalic and atraumatic.  Mouth/Throat: Oropharynx is clear and moist.  Eyes: Conjunctivae and EOM are normal. Pupils  are equal, round, and reactive to light.  Neck: Normal range of motion. Neck supple.  Cardiovascular: Normal rate, regular rhythm and intact distal pulses.   No murmur heard. Pulmonary/Chest: Effort normal and breath sounds normal. No respiratory distress. She has no wheezes. She has no rales.  Abdominal: Soft. She exhibits no distension. There is no tenderness. There is no rebound and no guarding.  Musculoskeletal: Normal range of motion. She exhibits no edema or tenderness.  Neurological: She is alert and oriented to person, place, and time.   Skin: Skin is warm and dry. No rash noted. No erythema.  Psychiatric: Her behavior is normal. Her affect is labile. Her speech is rapid and/or pressured and tangential. She is not actively hallucinating and not combative. She expresses impulsivity. She expresses no homicidal and no suicidal ideation.  Nursing note and vitals reviewed.   ED Course  Procedures (including critical care time) Labs Review Labs Reviewed  CBC  COMPREHENSIVE METABOLIC PANEL  ETHANOL  SALICYLATE LEVEL  ACETAMINOPHEN LEVEL  URINE RAPID DRUG SCREEN, HOSP PERFORMED    Imaging Review No results found.   EKG Interpretation None      MDM   Final diagnoses:  None    Patient presenting today with police under IVC for history of mental illness and drug abuse. Patient denies using any crack or heroin and states that she was just angry at her neighbors and that is why they're doing this to her. She has been putting G strings in her trees putting signs and her neighbors yard and states she is doing it tacked out because they made her angry. It is unclear whether patient is sleeping or taking her medications because she cannot stay on task long enough to answer question. She appears manic on exam but she is cooperative and calm. The police that brought her states that they get calls multiple times a day that patient attempts to drive down the middle of the road at 3 AM in the morning honking her morning disrupting all the residents. That she is recently been in jail and released in June after a DWI.  Currently it appears the patient's mental illnesses untreated. She is supposed to be on Prozac, doxepin, Tegretol and Seroquel.  Medical clearance labs pending the patient will need to be seen by TTS.    Blanchie Dessert, MD 09/26/14 [phone]
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
## 2014-09-27 DIAGNOSIS — F3112 Bipolar disorder, current episode manic without psychotic features, moderate: Secondary | ICD-10-CM

## 2014-09-27 MED ORDER — IBUPROFEN 200 MG PO TABS
600.0000 mg | ORAL_TABLET | Freq: Four times a day (QID) | ORAL | Status: DC | PRN
  Administered 2014-09-27: 600 mg via ORAL
  Filled 2014-09-27: qty 3

## 2014-09-27 MED ORDER — CARBAMAZEPINE 200 MG PO TABS
200.0000 mg | ORAL_TABLET | Freq: Two times a day (BID) | ORAL | Status: DC
  Administered 2014-09-27 – 2014-09-30 (×6): 200 mg via ORAL
  Filled 2014-09-27 (×8): qty 1

## 2014-09-27 MED ORDER — FLUOXETINE HCL 10 MG PO CAPS
10.0000 mg | ORAL_CAPSULE | Freq: Every day | ORAL | Status: DC
  Administered 2014-09-27 – 2014-09-28 (×2): 10 mg via ORAL
  Filled 2014-09-27 (×2): qty 1
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
## 2014-09-27 NOTE — Consult Note (Signed)
[facility] Consult   Reason for Consult:  Bipolar disorder, most recent episode Manic, moderate Referring Physician:  EDP Patient Identification: Kim [name] MRN:  [id number] Principal Diagnosis: Bipolar I disorder, most recent episode (or current) manic, moderate Diagnosis:   Patient Active Problem List   Diagnosis Date Noted  . Bipolar I disorder, most recent episode (or current) manic, moderate [F31.12] 11/09/2011    Priority: High  . Cocaine abuse [F14.10] 11/09/2011    Total Time spent with patient: 1 hour  Subjective:   Kim [name] is a 54 y.o. female patient admitted with Bipolar disorder, most recent episode Manic, moderate.  HPI:  Caucasian female, 54 years old was evaluated for symptoms of Manic behavior. Patient had pressure speech and flight of ideas this morning.  She was pleasant and answered questions but was minimizing her symptoms.  She was IVC by [employer] and brought to the hospital for terrorizing her neighbors by engaging in bizarre behaviors.  Patient has been hanging some "thong pants" in front of her yard trees.  She gets up at hight honking her horn for no reasons and have spent $2.5 million dollars of her inherited money on Cocaine.  She brought in two homeless people home to feed and help them but found out they were injecting Heroin in her Por[place].  Patient admitted to using Cocaine daily but vehemently denied using Heroin.  She admitted to previous diagnosis of Bipolar disorder but stated she stopped taking her mood stabilizers.  Patient reports poor sleep and fair appetite.  She stated that she will like to have a professional come in and help her manage her affairs.  Patient however stated that she did not see any reason for her admission to the hospital.  She has been accepted for admission and we will be seeking placement at any hospital with available beds.  HPI Elements:   Location:  Bipolar disorder, manic, moderate. Quality:  severe. Severity:   severe. Timing:  acute. Duration:  Chronic mental illness.. Context:  IVC by [employer] for bizarre behavior..  Past Medical History:  Past Medical History  Diagnosis Date  . Headache(784.0)   . ADHD (attention deficit hyperactivity disorder)   . 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 Knee surgery      right   Family History: No family history on file. Social History:  History  Alcohol Use No    Comment: "every 6 mos"     History  Drug Use  . Yes  . Special: Cocaine    History   Social History  . Marital Status: Single    Spouse Name: N/A  . Number of Children: N/A  . Years of Education: N/A   Social History Main Topics  . Smoking status: Never Smoker   . Smokeless tobacco: Not on file  . Alcohol Use: No     Comment: "every 6 mos"  . Drug Use: Yes    Special: Cocaine  . Sexual Activity: No   Other Topics Concern  . None   Social History Narrative   Additional Social History:    Pain Medications: SEE MAR Prescriptions: SEE MAR Over the Counter: SEE MAR History of alcohol / drug use?: Yes Name of Substance 1: Cocaine  1 - Age of First Use: 1999 1 - Amount (size/oz): "1x snort" 1 - Frequency: 2x's in the past month 1 - Duration: 2x's in the past  month; on/off for several years 1 - Last Use / Amount: 2 days ago                   Allergies:   Allergies  Allergen Reactions  . Penicillins     Unknown reaction.    Labs:  Results for orders placed or performed during the hospital encounter of 09/26/14 (from the past 48 hour(s))  Comprehensive metabolic panel     Status: Abnormal   Collection Time: 09/26/14  2:29 PM  Result Value Ref Range   Sodium 141 135 - 145 mmol/L   Potassium 3.4 (L) 3.5 - 5.1 mmol/L   Chloride 110 101 - 111 mmol/L   CO2 23 22 - 32 mmol/L   Glucose, Bld 98 65 - 99 mg/dL   BUN 27 (H) 6 - 20 mg/dL   Creatinine, Ser 0.84 0.44 - 1.00 mg/dL   Calcium 9.2 8.9 -  10.3 mg/dL   Total Protein 7.4 6.5 - 8.1 g/dL   Albumin 4.2 3.5 - 5.0 g/dL   AST 31 15 - 41 U/L   ALT 25 14 - 54 U/L   Alkaline Phosphatase 72 38 - 126 U/L   Total Bilirubin 0.6 0.3 - 1.2 mg/dL   GFR calc non Af Amer >60 >60 mL/min   GFR calc Af Amer >60 >60 mL/min    Comment: (NOTE) The eGFR has been calculated using the CKD EPI equation. This calculation has not been validated in all clinical situations. eGFR's persistently <60 mL/min signify possible Chronic Kidney Disease.    Anion gap 8 5 - 15  Ethanol (ETOH)     Status: None   Collection Time: 09/26/14  2:29 PM  Result Value Ref Range   Alcohol, Ethyl (B) <5 <5 mg/dL    Comment:        LOWEST DETECTABLE LIMIT FOR SERUM ALCOHOL IS 5 mg/dL FOR MEDICAL PURPOSES ONLY   Salicylate level     Status: None   Collection Time: 09/26/14  2:29 PM  Result Value Ref Range   Salicylate Lvl <6.9 2.8 - 30.0 mg/dL  Acetaminophen level     Status: Abnormal   Collection Time: 09/26/14  2:29 PM  Result Value Ref Range   Acetaminophen (Tylenol), Serum <10 (L) 10 - 30 ug/mL    Comment:        THERAPEUTIC CONCENTRATIONS VARY SIGNIFICANTLY. A RANGE OF 10-30 ug/mL MAY BE AN EFFECTIVE CONCENTRATION FOR MANY PATIENTS. HOWEVER, SOME ARE BEST TREATED AT CONCENTRATIONS OUTSIDE THIS RANGE. ACETAMINOPHEN CONCENTRATIONS >150 ug/mL AT 4 HOURS AFTER INGESTION AND >50 ug/mL AT 12 HOURS AFTER INGESTION ARE OFTEN ASSOCIATED WITH TOXIC REACTIONS.   CBC     Status: None   Collection Time: 09/26/14  2:29 PM  Result Value Ref Range   WBC 7.0 4.0 - 10.5 K/uL   RBC 4.14 3.87 - 5.11 MIL/uL   Hemoglobin 13.0 12.0 - 15.0 g/dL   HCT 38.9 36.0 - 46.0 %   MCV 94.0 78.0 - 100.0 fL   MCH 31.4 26.0 - 34.0 pg   MCHC 33.4 30.0 - 36.0 g/dL   RDW 13.6 11.5 - 15.5 %   Platelets 298 150 - 400 K/uL  Urine rapid drug screen (hosp performed)     Status: Abnormal   Collection Time: 09/26/14  5:31 PM  Result Value Ref Range   Opiates NONE DETECTED NONE DETECTED    Cocaine POSITIVE (A) NONE DETECTED   Benzodiazepines NONE DETECTED NONE DETECTED   Amphetamines  NONE DETECTED NONE DETECTED   Tetrahydrocannabinol NONE DETECTED NONE DETECTED   Barbiturates NONE DETECTED NONE DETECTED    Comment:        DRUG SCREEN FOR MEDICAL PURPOSES ONLY.  IF CONFIRMATION IS NEEDED FOR ANY PURPOSE, NOTIFY LAB WITHIN 5 DAYS.        LOWEST DETECTABLE LIMITS FOR URINE DRUG SCREEN Drug Class       Cutoff (ng/mL) Amphetamine      1000 Barbiturate      200 Benzodiazepine   793 Tricyclics       903 Opiates          300 Cocaine          300 THC              50     Vitals: Blood pressure 135/72, pulse 96, temperature 97.6 F (36.4 C), temperature source Oral, resp. rate 18, SpO2 98 %.  Risk to Self: Suicidal Ideation: No Suicidal Intent: No Is patient at risk for suicide?: No Suicidal Plan?: No Access to Means: No What has been your use of drugs/alcohol within the last 12 months?:  (cocaine) How many times?:  (1x-1995 "I tried to OD") Other Self Harm Risks:  (pt denies ) Triggers for Past Attempts: Other (Comment) ("I tried to commit suicide b/c I felt like a big looser") Intentional Self Injurious Behavior: None Risk to Others: Homicidal Ideation: No Thoughts of Harm to Others: No Current Homicidal Intent: No Current Homicidal Plan: No Access to Homicidal Means: No Identified Victim:  (n/a) History of harm to others?: No Assessment of Violence: None Noted Violent Behavior Description:  (patient is calm and cooperative ) Does patient have access to weapons?: No Criminal Charges Pending?: No Does patient have a court date: No Prior Inpatient Therapy: Prior Inpatient Therapy: Yes Prior Therapy Dates:  (11/08/11-[facility]; 1995- "5th floor at [facility]; 1997-[facility]) Prior Therapy Facilty/Provider(s):  ([facility], "Fifth floor at [facility]", and "[facility]") Reason for Treatment:  (suicide attempt, depression, anxiety, stress) Prior Outpatient Therapy: Prior Outpatient Therapy:  Yes Prior Therapy Dates:  (current) Prior Therapy Facilty/Provider(s):  (current-[facility] Rowe Robert [facility];past-[facility]) Reason for Treatment:  (medication managment ) Does patient have an ACCT team?: No Does patient have Intensive In-House Services?  : No Does patient have [facility] services? : No Does patient have P4[facility] services?: No  Current Facility-Administered Medications  Medication Dose Route Frequency Provider Last Rate Last Dose  . carbamazepine (TEGRETOL) tablet 200 mg  200 mg Oral BID PC Ura Yingling      . FLUoxetine (PROZAC) capsule 10 mg  10 mg Oral Daily Buzz Axel      . hydrOXYzine (ATARAX/VISTARIL) tablet 50 mg  50 mg Oral TID PRN Delfin Gant, NP   50 mg at 09/26/14 2130  . LORazepam (ATIVAN) tablet 1 mg  1 mg Oral Q8H PRN Delfin Gant, NP   1 mg at 09/26/14 2130  . traZODone (DESYREL) tablet 50 mg  50 mg Oral QHS Delfin Gant, NP   50 mg at 09/26/14 2130   Current Outpatient Prescriptions  Medication Sig Dispense Refill  . Aspirin-Acetaminophen-Caffeine (EXCEDRIN MIGRAINE PO) Take 2 tablets by mouth daily as needed (headache).    . calcium carbonate (TUMS - DOSED IN MG ELEMENTAL CALCIUM) 500 MG chewable tablet Chew 2-3 tablets by mouth daily as needed for heartburn.    . doxepin (SINEQUAN) 25 MG capsule Take 25 mg by mouth daily.    Marland Kitchen FLUoxetine (PROZAC) 20  MG capsule Take 60 mg by mouth daily.    Marland Kitchen ibuprofen (ADVIL,MOTRIN) 200 MG tablet Take 400 mg by mouth every 6 (six) hours as needed for moderate pain.    . RaNITidine HCl (ZANTAC PO) Take 1 tablet by mouth daily as needed (heartburn).    . carbamazepine (TEGRETOL XR) 200 MG 12 hr tablet Take 1 tablet (200 mg) in the am and 2 tablets (430m) at bedtime for mood (Patient not taking: Reported on 09/26/2014) 90 tablet 0  . QUEtiapine (SEROQUEL) 50 MG tablet Take 3 tablets (150 mg total) by mouth at bedtime. For thoughts/mood (Patient not taking: Reported on 09/26/2014) 90 tablet 0     Musculoskeletal: Strength & Muscle Tone: within normal limits Gait & Station: normal Patient leans: N/A  Psychiatric Specialty Exam: Physical Exam  Review of Systems  Constitutional: Negative.   Eyes: Negative.   Respiratory: Negative.   Cardiovascular: Negative.   Gastrointestinal: Negative.   Genitourinary: Negative.   Musculoskeletal: Negative.   Skin: Negative.   Neurological: Negative.   Endo/Heme/Allergies: Negative.     Blood pressure 135/72, pulse 96, temperature 97.6 F (36.4 C), temperature source Oral, resp. rate 18, SpO2 98 %.There is no weight on file to calculate BMI.  General Appearance: Casual  Eye Contact::  Good  Speech:  Clear and Coherent and Normal Rate  Volume:  Normal  Mood:  Anxious and Euphoric  Affect:  Congruent and Full Range  Thought Process:  Circumstantial, Tangential and flight of ideas  Orientation:  Full (Time, Place, and Person)  Thought Content:  WDL  Suicidal Thoughts:  No  Homicidal Thoughts:  No  Memory:  Immediate;   Good Recent;   Good Remote;   Good  Judgement:  Impaired  Insight:  Shallow  Psychomotor Activity:  Normal  Concentration:  Fair  Recall:  NA  Fund of Knowledge:Fair  Language: Good  Akathisia:  NA  Handed:  Right  AIMS (if indicated):     Assets:  Desire for Improvement  ADL's:  Intact  Cognition: Impaired,  Moderate  Sleep:      Medical Decision Making: Review of Psycho-Social Stressors (1) and Established Problem, Worsening (2)  Treatment Plan Summary: Daily contact with patient to assess and evaluate symptoms and progress in treatment and Medication management  Plan:  Start Tegretol 200 mg po bid PC for mood stabilization, Prozac 10 mg po daily for depression, Trazodone 50 mg po at bed time as needed for sleep, and Vistaril 50 mg po every 8 hours as needed for anxiety, Ativan 1 mg  Po every 8 hours as needed for anxiety. Disposition: Admit and seek placement  ODelfin Gant   PMHNP-BC 09/27/2014 11:58 AM Patient seen face-to-face for psychiatric evaluation, chart reviewed and case discussed with the physician extender and developed treatment plan. Reviewed the information documented and agree with the treatment plan. MCorena Pilgrim MD

## 2014-09-27 NOTE — [facility] Counselor (Signed)
Patient is pending review for possible placement with [facility]

## 2014-09-27 NOTE — BH Assessment (Signed)
[facility] Assessment Progress Note  The following facilities have been contacted to seek placement for this pt, with results as noted:  Beds available, information sent, decision pending:  [facility]  At capacity:  [facility] [place], [place] Triage Specialist [phone]

## 2014-09-27 NOTE — Progress Notes (Signed)
Patient on the [facility] waitlist, per [place].  Kim [name], [facility] Disposition staff 09/27/2014 10:54 PM

## 2014-09-27 NOTE — ED Notes (Signed)
Pt AAO x 3, no distress noted, sleeping at present, easily arouseable to verbal stimuli.  MOnitoring for safety, Q 15 min checks in effect.

## 2014-09-27 NOTE — BH Assessment (Signed)
[facility] Assessment Progress Note   Patient referred out to [facility], [facility], [place].

## 2014-09-27 NOTE — Progress Notes (Signed)
  CM discussed and provided written information for uninsured accepting pcps, discussed the importance of pcp vs EDP services for f/u care, www.needymeds.org, www.goodrx.com, discounted pharmacies and other [employer] such as [employer], affordable care act, financial assistance, uninsured dental services, [place] med assist, DSS and  health department  Reviewed resources for [employer] uninsured accepting pcps like [facility], family medicine at [employer], [facility], [facility], local urgent care centers, [facility], [facility] family practice, general medical clinics, [facility] [place], [facility] urgent care plus others, medication resources, [facility] out patient pharmacies and housing Pt voiced understanding and appreciation of resources provided  Pt states she has had a pcp but he retired states she obtained another one but "I'm not going back to him. He's a jerk." Un[place]") Pt refused a referral to [facility] She states she is friends with doctors and dentists and states she can find her own doctor

## 2014-09-28 DIAGNOSIS — F3112 Bipolar disorder, current episode manic without psychotic features, moderate: Secondary | ICD-10-CM | POA: Diagnosis not present

## 2014-09-28 MED ORDER — FLUOXETINE HCL 20 MG PO CAPS
60.0000 mg | ORAL_CAPSULE | Freq: Every day | ORAL | Status: DC
  Administered 2014-09-29 – 2014-09-30 (×2): 60 mg via ORAL
  Filled 2014-09-28 (×2): qty 3

## 2014-09-28 NOTE — BH Assessment (Signed)
[facility] Assessment Progress Note  The following facilities have been contacted to seek placement for this pt, with results as noted:  Beds available, information sent, decision pending:  [facility], [facility]  At capacity:  [facility]  Ankush Gintz, MA Triage Specialist [phone]     

## 2014-09-28 NOTE — ED Notes (Signed)
Pt. Noted sleeping in room. No complaints or concerns voiced. No distress or abnormal behavior noted. Will continue to monitor with security cameras. Q 15 minute rounds continue. 

## 2014-09-28 NOTE — ED Notes (Signed)
Pt. Noted in room. No complaints or concerns voiced. No distress or abnormal behavior noted. Will continue to monitor with security cameras. Q 15 minute rounds continue. 

## 2014-09-28 NOTE — Consult Note (Signed)
[facility] Consult   Reason for Consult:  Bipolar disorder, most recent episode Manic, moderate Referring Physician:  EDP Patient Identification: Kim [name] MRN:  [id number] Principal Diagnosis: Bipolar I disorder, most recent episode (or current) manic, moderate Diagnosis:   Patient Active Problem List   Diagnosis Date Noted  . Bipolar I disorder, most recent episode (or current) manic, moderate [F31.12] 11/09/2011    Priority: High  . Cocaine abuse [F14.10] 11/09/2011    Priority: High    Total Time spent with patient: 30 minutes  Subjective:   Kim [name] is a 54 y.o. female patient admitted with Bipolar disorder, most recent episode Manic, moderate.  HPI:  Patient remains manic today and requesting a mole removal.  She feels she does not need a bed because she "will take exactly what I am suppose to."  Kim [name] also wants to return to her work at her [employer].  She is not stable at this time and will need a clinical placement.  HPI Elements:   Location:  Bipolar disorder, manic, moderate. Quality:  severe. Severity:  severe. Timing:  acute. Duration:  Chronic mental illness.. Context:  IVC by [employer] for bizarre behavior..  Past Medical History:  Past Medical History  Diagnosis Date  . Headache(784.0)   . ADHD (attention deficit hyperactivity disorder)   . 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 Knee surgery      right   Family History: No family history on file. Social History:  History  Alcohol Use No    Comment: "every 6 mos"     History  Drug Use  . Yes  . Special: Cocaine    History   Social History  . Marital Status: Single    Spouse Name: N/A  . Number of Children: N/A  . Years of Education: N/A   Social History Main Topics  . Smoking status: Never Smoker   . Smokeless tobacco: Not on file  . Alcohol Use: No     Comment: "every 6 mos"  . Drug Use:  Yes    Special: Cocaine  . Sexual Activity: No   Other Topics Concern  . None   Social History Narrative   Additional Social History:    Pain Medications: SEE MAR Prescriptions: SEE MAR Over the Counter: SEE MAR History of alcohol / drug use?: Yes Name of Substance 1: Cocaine  1 - Age of First Use: 1999 1 - Amount (size/oz): "1x snort" 1 - Frequency: 2x's in the past month 1 - Duration: 2x's in the past month; on/off for several years 1 - Last Use / Amount: 2 days ago                   Allergies:   Allergies  Allergen Reactions  . Penicillins     Unknown reaction.    Labs:  Results for orders placed or performed during the hospital encounter of 09/26/14 (from the past 48 hour(s))  Comprehensive metabolic panel     Status: Abnormal   Collection Time: 09/26/14  2:29 PM  Result Value Ref Range   Sodium 141 135 - 145 mmol/L   Potassium 3.4 (L) 3.5 - 5.1 mmol/L   Chloride 110 101 - 111 mmol/L   CO2 23 22 - 32 mmol/L   Glucose, Bld 98 65 - 99 mg/dL   BUN 27 (H) 6 - 20 mg/dL   Creatinine,  Ser 0.84 0.44 - 1.00 mg/dL   Calcium 9.2 8.9 - 10.3 mg/dL   Total Protein 7.4 6.5 - 8.1 g/dL   Albumin 4.2 3.5 - 5.0 g/dL   AST 31 15 - 41 U/L   ALT 25 14 - 54 U/L   Alkaline Phosphatase 72 38 - 126 U/L   Total Bilirubin 0.6 0.3 - 1.2 mg/dL   GFR calc non Af Amer >60 >60 mL/min   GFR calc Af Amer >60 >60 mL/min    Comment: (NOTE) The eGFR has been calculated using the CKD EPI equation. This calculation has not been validated in all clinical situations. eGFR's persistently <60 mL/min signify possible Chronic Kidney Disease.    Anion gap 8 5 - 15  Ethanol (ETOH)     Status: None   Collection Time: 09/26/14  2:29 PM  Result Value Ref Range   Alcohol, Ethyl (B) <5 <5 mg/dL    Comment:        LOWEST DETECTABLE LIMIT FOR SERUM ALCOHOL IS 5 mg/dL FOR MEDICAL PURPOSES ONLY   Salicylate level     Status: None   Collection Time: 09/26/14  2:29 PM  Result Value Ref Range    Salicylate Lvl <1.6 2.8 - 30.0 mg/dL  Acetaminophen level     Status: Abnormal   Collection Time: 09/26/14  2:29 PM  Result Value Ref Range   Acetaminophen (Tylenol), Serum <10 (L) 10 - 30 ug/mL    Comment:        THERAPEUTIC CONCENTRATIONS VARY SIGNIFICANTLY. A RANGE OF 10-30 ug/mL MAY BE AN EFFECTIVE CONCENTRATION FOR MANY PATIENTS. HOWEVER, SOME ARE BEST TREATED AT CONCENTRATIONS OUTSIDE THIS RANGE. ACETAMINOPHEN CONCENTRATIONS >150 ug/mL AT 4 HOURS AFTER INGESTION AND >50 ug/mL AT 12 HOURS AFTER INGESTION ARE OFTEN ASSOCIATED WITH TOXIC REACTIONS.   CBC     Status: None   Collection Time: 09/26/14  2:29 PM  Result Value Ref Range   WBC 7.0 4.0 - 10.5 K/uL   RBC 4.14 3.87 - 5.11 MIL/uL   Hemoglobin 13.0 12.0 - 15.0 g/dL   HCT 38.9 36.0 - 46.0 %   MCV 94.0 78.0 - 100.0 fL   MCH 31.4 26.0 - 34.0 pg   MCHC 33.4 30.0 - 36.0 g/dL   RDW 13.6 11.5 - 15.5 %   Platelets 298 150 - 400 K/uL  Urine rapid drug screen (hosp performed)     Status: Abnormal   Collection Time: 09/26/14  5:31 PM  Result Value Ref Range   Opiates NONE DETECTED NONE DETECTED   Cocaine POSITIVE (A) NONE DETECTED   Benzodiazepines NONE DETECTED NONE DETECTED   Amphetamines NONE DETECTED NONE DETECTED   Tetrahydrocannabinol NONE DETECTED NONE DETECTED   Barbiturates NONE DETECTED NONE DETECTED    Comment:        DRUG SCREEN FOR MEDICAL PURPOSES ONLY.  IF CONFIRMATION IS NEEDED FOR ANY PURPOSE, NOTIFY LAB WITHIN 5 DAYS.        LOWEST DETECTABLE LIMITS FOR URINE DRUG SCREEN Drug Class       Cutoff (ng/mL) Amphetamine      1000 Barbiturate      200 Benzodiazepine   109 Tricyclics       604 Opiates          300 Cocaine          300 THC              50     Vitals: Blood pressure 140/98, pulse 100, temperature 98.6 F (  37 C), temperature source Oral, resp. rate 18, SpO2 96 %.  Risk to Self: Suicidal Ideation: No Suicidal Intent: No Is patient at risk for suicide?: No Suicidal Plan?: No Access  to Means: No What has been your use of drugs/alcohol within the last 12 months?:  (cocaine) How many times?:  (1x-1995 "I tried to OD") Other Self Harm Risks:  (pt denies ) Triggers for Past Attempts: Other (Comment) ("I tried to commit suicide b/c I felt like a big looser") Intentional Self Injurious Behavior: None Risk to Others: Homicidal Ideation: No Thoughts of Harm to Others: No Current Homicidal Intent: No Current Homicidal Plan: No Access to Homicidal Means: No Identified Victim:  (n/a) History of harm to others?: No Assessment of Violence: None Noted Violent Behavior Description:  (patient is calm and cooperative ) Does patient have access to weapons?: No Criminal Charges Pending?: No Does patient have a court date: No Prior Inpatient Therapy: Prior Inpatient Therapy: Yes Prior Therapy Dates:  (11/08/11-[facility]; 1995- "5th floor at [facility]; [facility]) Prior Therapy Facilty/Provider(s):  ([place], "Fifth floor at [facility]", and "[facility]") Reason for Treatment:  (suicide attempt, depression, anxiety, stress) Prior Outpatient Therapy: Prior Outpatient Therapy: Yes Prior Therapy Dates:  (current) Prior Therapy Facilty/Provider(s):  (current-Dr. Rowe Robert @ [facility];past-[facility]) Reason for Treatment:  (medication managment ) Does patient have an ACCT team?: No Does patient have Intensive In-House Services?  : No Does patient have [facility] services? : No Does patient have P4CC services?: No  Current Facility-Administered Medications  Medication Dose Route Frequency Provider Last Rate Last Dose  . carbamazepine (TEGRETOL) tablet 200 mg  200 mg Oral BID PC Debanhi Blaker   200 mg at 09/28/14 0951  . FLUoxetine (PROZAC) capsule 10 mg  10 mg Oral Daily Samin Milke   10 mg at 09/28/14 0951  . hydrOXYzine (ATARAX/VISTARIL) tablet 50 mg  50 mg Oral TID PRN Delfin Gant, NP   50 mg at 09/27/14 2157  . ibuprofen (ADVIL,MOTRIN) tablet 600 mg  600 mg Oral Q6H PRN Delfin Gant, NP   600 mg at 09/27/14 1804  . LORazepam (ATIVAN) tablet 1 mg  1 mg Oral Q8H PRN Delfin Gant, NP   1 mg at 09/27/14 2156  . traZODone (DESYREL) tablet 50 mg  50 mg Oral QHS Delfin Gant, NP   50 mg at 09/27/14 2157   Current Outpatient Prescriptions  Medication Sig Dispense Refill  . Aspirin-Acetaminophen-Caffeine (EXCEDRIN MIGRAINE PO) Take 2 tablets by mouth daily as needed (headache).    . calcium carbonate (TUMS - DOSED IN MG ELEMENTAL CALCIUM) 500 MG chewable tablet Chew 2-3 tablets by mouth daily as needed for heartburn.    . doxepin (SINEQUAN) 25 MG capsule Take 25 mg by mouth daily.    Marland Kitchen FLUoxetine (PROZAC) 20 MG capsule Take 60 mg by mouth daily.    Marland Kitchen ibuprofen (ADVIL,MOTRIN) 200 MG tablet Take 400 mg by mouth every 6 (six) hours as needed for moderate pain.    . RaNITidine HCl (ZANTAC PO) Take 1 tablet by mouth daily as needed (heartburn).    . carbamazepine (TEGRETOL XR) 200 MG 12 hr tablet Take 1 tablet (200 mg) in the am and 2 tablets (469m) at bedtime for mood (Patient not taking: Reported on 09/26/2014) 90 tablet 0  . QUEtiapine (SEROQUEL) 50 MG tablet Take 3 tablets (150 mg total) by mouth at bedtime. For thoughts/mood (Patient not taking: Reported on 09/26/2014) 90 tablet 0    Musculoskeletal:  Strength & Muscle Tone: within normal limits Gait & Station: normal Patient leans: N/A  Psychiatric Specialty Exam: Physical Exam  Review of Systems  Constitutional: Negative.   Eyes: Negative.   Respiratory: Negative.   Cardiovascular: Negative.   Gastrointestinal: Negative.   Genitourinary: Negative.   Musculoskeletal: Negative.   Skin: Negative.   Neurological: Negative.   Endo/Heme/Allergies: Negative.     Blood pressure 140/98, pulse 100, temperature 98.6 F (37 C), temperature source Oral, resp. rate 18, SpO2 96 %.There is no weight on file to calculate BMI.  General Appearance: Casual  Eye Contact::  Good  Speech:  Clear and Coherent and Normal  Rate  Volume:  Normal  Mood:  Anxious and Euphoric  Affect:  Congruent and Full Range  Thought Process:  Circumstantial, Tangential and flight of ideas  Orientation:  Full (Time, Place, and Person)  Thought Content:  WDL  Suicidal Thoughts:  No  Homicidal Thoughts:  No  Memory:  Immediate;   Good Recent;   Good Remote;   Good  Judgement:  Impaired  Insight:  Shallow  Psychomotor Activity:  Normal  Concentration:  Fair  Recall:  NA  Fund of Knowledge:Fair  Language: Good  Akathisia:  NA  Handed:  Right  AIMS (if indicated):     Assets:  Desire for Improvement  ADL's:  Intact  Cognition: Impaired,  Moderate  Sleep:      Medical Decision Making: Review of Psycho-Social Stressors (1) and Established Problem, Worsening (2)  Treatment Plan Summary: Daily contact with patient to assess and evaluate symptoms and progress in treatment and Medication management  Plan: Continue medications, seek inpatient hospitalization for stabilization Disposition: Admit and seek placement  Waylan Boga   PMHNP-BC 09/28/2014 1:59 PM Patient seen face-to-face for psychiatric evaluation, chart reviewed and case discussed with the physician extender and developed treatment plan. Reviewed the information documented and agree with the treatment plan. Corena Pilgrim, MD

## 2014-09-28 NOTE — ED Notes (Addendum)
Report received from [facility]. Pt. Sleeping, respirations regular and unlabored. Will continue to monitor for safety via security cameras and Q 15 minute checks.

## 2014-09-28 NOTE — ED Notes (Signed)
Pt is very drowsy today.  She stated that last nights meds were too strong.  I explained that they were prn meds and she can choose not to take them.  She denies SI HI and AVH.  She wants to be discharged.  She has been very cooperative today.  15 minute checks in place.

## 2014-09-28 NOTE — Progress Notes (Signed)
CSW received call from [facility] re: referral;  at capacity at this time.   Belia Heman, [place] Work  [employer] [phone]

## 2014-09-28 NOTE — Progress Notes (Signed)
Cedric from [facility] called Probation officer inquired if pt still needs placement. Per Cedric at [facility], patient is still on the waitlist.  Kim [name], [place] Disposition staff 09/28/2014 11:10 PM

## 2014-09-29 LAB — RAPID URINE DRUG SCREEN, HOSP PERFORMED
Amphetamines: NOT DETECTED
Barbiturates: NOT DETECTED
Benzodiazepines: NOT DETECTED
Cocaine: NOT DETECTED
OPIATES: NOT DETECTED
Tetrahydrocannabinol: NOT DETECTED

## 2014-09-29 NOTE — ED Notes (Signed)
Pt reports that she wants it noted that she is not "dope sick or sleepy."  Pt also requesting a repeat UDS.

## 2014-09-29 NOTE — ED Notes (Signed)
Up to the bathroom 

## 2014-09-29 NOTE — ED Notes (Signed)
Report received from Janie Rambo RN. Pt. Alert and oriented in no distress denies SI, HI, AVH and pain.  Pt. Instructed to come to me with problems or concerns.Will continue to monitor for safety via security cameras and Q 15 minute checks. 

## 2014-09-29 NOTE — ED Notes (Signed)
Pt. Noted sleeping in room. No complaints or concerns voiced. No distress or abnormal behavior noted. Will continue to monitor with security cameras. Q 15 minute rounds continue. 

## 2014-09-29 NOTE — ED Notes (Signed)
Pt. Noted in room. No complaints or concerns voiced. No distress or abnormal behavior noted. Will continue to monitor with security cameras. Q 15 minute rounds continue. 

## 2014-09-29 NOTE — [facility] Counselor (Signed)
This Probation officer followed up with the following facilities in regards to placement for this patient:  [facility]-pt was denied; there was no reasoning per report from Bryan in the intake department [facility]- pt was denied due to acuity per report from Ste[place] in the intake department  Redmond Pulling, [place] Counselor

## 2014-09-29 NOTE — [facility] Counselor (Signed)
This Probation officer sent out supporting documentation to the following facilities in regards to placement for this patient:  [place], [place] OBS Counselor

## 2014-09-29 NOTE — Progress Notes (Signed)
Followed up on inpatient psych placement efforts:  Referral made: [facility]- per Renato Battles- per [facility]- on waitlist per Samella Parr  At capacity: [facility] per Shirlean Myl[facility] per Scar[facility] per Beverlee Nims, low acuity beds only [facility]- per Joanie  Left voicemails with Tildon Husky, and [facility] and will refer if there is availability.  Sharren Bridge, MSW, [place] Clinical Social Work, Disposition  09/29/2014 [phone]

## 2014-09-29 NOTE — Consult Note (Signed)
[facility] Consult   Reason for Consult:  Bipolar disorder, most recent episode Manic, moderate Referring Physician:  EDP Patient Identification: Kim [name] MRN:  [id number] Principal Diagnosis: Bipolar I disorder, most recent episode (or current) manic, moderate Diagnosis:   Patient Active Problem List   Diagnosis Date Noted  . Bipolar I disorder, most recent episode (or current) manic, moderate [F31.12] 11/09/2011  . Cocaine abuse [F14.10] 11/09/2011    Total Time spent with patient: 30 minutes  Subjective:   Kim [name] is a 54 y.o. female patient admitted with Bipolar disorder, most recent episode Manic, moderate.  She reports a past Hx of crack but denies heroin use.    HPI:  Patient remains manic today.  She is talking in an disorganized manner.  Per report, she "terrorizes the neighborhood.  She honks car horn in the middle of the night to wake up neighbors and she puts up flagrant signs in her neighbor's yards.  She feels she does not need a bed because she "is completely fine."  Kim [name] also wants to return to her work at her [employer].  She is not stable at this time and will need a clinical placement.  She states that she sees Dr Kim [name] "shrink" and Kim [name] for outpatient therapy.  "I've been on Prozac, Vistaril and Risperdal."  History of OD in 1995 at [facility].  She had her license revoked for driving with a license.  She denies SI/HI/AVH.  HPI Elements:   Location:  Bipolar disorder, manic, moderate. Quality:  severe. Severity:  severe. Timing:  acute. Duration:  Chronic mental illness.. Context:  IVC by [employer] for bizarre behavior..  Past Medical History:  Past Medical History  Diagnosis Date  . Headache(784.0)   . ADHD (attention deficit hyperactivity disorder)   . 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 Knee surgery      right   Family History: No family history on  file. Social History:  History  Alcohol Use No    Comment: "every 6 mos"     History  Drug Use  . Yes  . Special: Cocaine    History   Social History  . Marital Status: Single    Spouse Name: N/A  . Number of Children: N/A  . Years of Education: N/A   Social History Main Topics  . Smoking status: Never Smoker   . Smokeless tobacco: Not on file  . Alcohol Use: No     Comment: "every 6 mos"  . Drug Use: Yes    Special: Cocaine  . Sexual Activity: No   Other Topics Concern  . None   Social History Narrative   Additional Social History:    Pain Medications: SEE MAR Prescriptions: SEE MAR Over the Counter: SEE MAR History of alcohol / drug use?: Yes Name of Substance 1: Cocaine  1 - Age of First Use: 1999 1 - Amount (size/oz): "1x snort" 1 - Frequency: 2x's in the past month 1 - Duration: 2x's in the past month; on/off for several years 1 - Last Use / Amount: 2 days ago   Allergies:   Allergies  Allergen Reactions  . Penicillins     Unknown reaction.    Labs:  No results found for this or any previous visit (from the past 48 hour(s)).  Vitals: Blood pressure 153/92, pulse 98, temperature 98.2 F (36.8 C), temperature source Oral,  resp. rate 16, SpO2 96 %.  Risk to Self: Suicidal Ideation: No Suicidal Intent: No Is patient at risk for suicide?: No Suicidal Plan?: No Access to Means: No What has been your use of drugs/alcohol within the last 12 months?:  (cocaine) How many times?:  (1x-1995 "I tried to OD") Other Self Harm Risks:  (pt denies ) Triggers for Past Attempts: Other (Comment) ("I tried to commit suicide b/c I felt like a big looser") Intentional Self Injurious Behavior: None Risk to Others: Homicidal Ideation: No Thoughts of Harm to Others: No Current Homicidal Intent: No Current Homicidal Plan: No Access to Homicidal Means: No Identified Victim:  (n/a) History of harm to others?: No Assessment of Violence: None Noted Violent Behavior  Description:  (patient is calm and cooperative ) Does patient have access to weapons?: No Criminal Charges Pending?: No Does patient have a court date: No Prior Inpatient Therapy: Prior Inpatient Therapy: Yes Prior Therapy Dates:  (11/08/11-[facility]; 1995- "5th floor at [facility]; 1997-[facility]) Prior Therapy Facilty/Provider(s):  ([place], "Fifth floor at [facility]", and [facility]") Reason for Treatment:  (suicide attempt, depression, anxiety, stress) Prior Outpatient Therapy: Prior Outpatient Therapy: Yes Prior Therapy Dates:  (current) Prior Therapy Facilty/Provider(s):  (current-Dr. Rowe [name] @ [facility];past-[facility]) Reason for Treatment:  (medication managment ) Does patient have an ACCT team?: No Does patient have Intensive In-House Services?  : No Does patient have [facility] services? : No Does patient have P4CC services?: No  Current Facility-Administered Medications  Medication Dose Route Frequency Provider Last Rate Last Dose  . carbamazepine (TEGRETOL) tablet 200 mg  200 mg Oral BID PC Kim [name]   200 mg at 09/29/14 0931  . FLUoxetine (PROZAC) capsule 60 mg  60 mg Oral Daily Kim Pour, NP   60 mg at 09/29/14 0931  . hydrOXYzine (ATARAX/VISTARIL) tablet 50 mg  50 mg Oral TID PRN Kim Gant, NP   50 mg at 09/27/14 2157  . ibuprofen (ADVIL,MOTRIN) tablet 600 mg  600 mg Oral Q6H PRN Kim Gant, NP   600 mg at 09/27/14 1804  . LORazepam (ATIVAN) tablet 1 mg  1 mg Oral Q8H PRN Kim Gant, NP   1 mg at 09/27/14 2156  . traZODone (DESYREL) tablet 50 mg  50 mg Oral QHS Kim Gant, NP   50 mg at 09/28/14 2147   Current Outpatient Prescriptions  Medication Sig Dispense Refill  . Aspirin-Acetaminophen-Caffeine (EXCEDRIN MIGRAINE PO) Take 2 tablets by mouth daily as needed (headache).    . calcium carbonate (TUMS - DOSED IN MG ELEMENTAL CALCIUM) 500 MG chewable tablet Chew 2-3 tablets by mouth daily as needed for heartburn.    . doxepin (SINEQUAN) 25 MG  capsule Take 25 mg by mouth daily.    Marland Kitchen FLUoxetine (PROZAC) 20 MG capsule Take 60 mg by mouth daily.    Marland Kitchen ibuprofen (ADVIL,MOTRIN) 200 MG tablet Take 400 mg by mouth every 6 (six) hours as needed for moderate pain.    . RaNITidine HCl (ZANTAC PO) Take 1 tablet by mouth daily as needed (heartburn).    . carbamazepine (TEGRETOL XR) 200 MG 12 hr tablet Take 1 tablet (200 mg) in the am and 2 tablets (400mg ) at bedtime for mood (Patient not taking: Reported on 09/26/2014) 90 tablet 0  . QUEtiapine (SEROQUEL) 50 MG tablet Take 3 tablets (150 mg total) by mouth at bedtime. For thoughts/mood (Patient not taking: Reported on 09/26/2014) 90 tablet 0    Musculoskeletal: Strength & Muscle  Tone: within normal limits Gait & Station: normal Patient leans: N/A  Psychiatric Specialty Exam: Physical Exam  Review of Systems  Constitutional: Negative.   Eyes: Negative.   Respiratory: Negative.   Cardiovascular: Negative.   Gastrointestinal: Negative.   Genitourinary: Negative.   Musculoskeletal: Negative.   Skin: Negative.   Neurological: Negative.   Endo/Heme/Allergies: Negative.     Blood pressure 153/92, pulse 98, temperature 98.2 F (36.8 C), temperature source Oral, resp. rate 16, SpO2 96 %.There is no weight on file to calculate BMI.  General Appearance: Casual  Eye Contact::  Good  Speech:  Clear and Coherent and Normal Rate  Volume:  Normal  Mood:  Anxious and Euphoric  Affect:  Congruent and Full Range  Thought Process:  Circumstantial, Tangential and flight of ideas  Orientation:  Full (Time, Place, and Person)  Thought Content:  WDL  Suicidal Thoughts:  No  Homicidal Thoughts:  No  Memory:  Immediate;   Good Recent;   Good Remote;   Good  Judgement:  Impaired  Insight:  Shallow  Psychomotor Activity:  Normal  Concentration:  Fair  Recall:  NA  Fund of Knowledge:Fair  Language: Good  Akathisia:  NA  Handed:  Right  AIMS (if indicated):     Assets:  Desire for Improvement   ADL's:  Intact  Cognition: Impaired,  Moderate  Sleep:      Medical Decision Making: Review of Psycho-Social Stressors (1) and Established Problem, Worsening (2)  Treatment Plan Summary: Patient has been suffering with bipolar disorder with most recent episode mania and also substance abuse, she has a poor insight and judgment. Patient has a disorganized thought process and inappropriate behaviors which resulted causing disturbance in her community. Patient meet criteria for acute psychiatric hospitalization for crisis stabilization, safety monitoring and medication management.  Daily contact with patient to assess and evaluate symptoms and progress in treatment and Medication management  Plan: Continue medications, seek inpatient hospitalization for stabilization Disposition: Admit and seek placement  [place]   AGNP-BC 09/29/2014 4:17 PM  Patient seen face-to-face for this evaluation, case discussed with treatment team and the physician extender and develop treatment plan. Reviewed the information documented and agree with the treatment plan.  Darl Kuss,JANARDHAHA R. 09/30/2014 11:26 AM

## 2014-09-29 NOTE — BH Assessment (Signed)
[facility] Assessment Progress Note   Ron from [facility] called just now to confirm that we still need placement for placement.  Clinician informed him that patient still needed placement.  Ron said that pt will stay on their wait list.

## 2014-09-30 ENCOUNTER — Encounter (HOSPITAL_COMMUNITY): Payer: Self-pay | Admitting: *Deleted

## 2014-09-30 ENCOUNTER — Inpatient Hospital Stay (HOSPITAL_COMMUNITY)
Admission: AD | Admit: 2014-09-30 | Discharge: 2014-10-03 | DRG: 885 | Disposition: A | Discharge status: Home / Self Care | Payer: Federal, State, Local not specified - Other | Source: Intra-hospital | Attending: Psychiatry | Admitting: Psychiatry

## 2014-09-30 DIAGNOSIS — F312 Bipolar disorder, current episode manic severe with psychotic features: Principal | ICD-10-CM | POA: Diagnosis present

## 2014-09-30 MED ORDER — CARBAMAZEPINE 200 MG PO TABS
200.0000 mg | ORAL_TABLET | Freq: Two times a day (BID) | ORAL | Status: DC
  Administered 2014-09-30 – 2014-10-03 (×6): 200 mg via ORAL
  Filled 2014-09-30: qty 1
  Filled 2014-09-30: qty 28
  Filled 2014-09-30 (×4): qty 1
  Filled 2014-09-30: qty 28
  Filled 2014-09-30 (×3): qty 1

## 2014-09-30 MED ORDER — IBUPROFEN 600 MG PO TABS
600.0000 mg | ORAL_TABLET | Freq: Four times a day (QID) | ORAL | Status: DC | PRN
Start: 2014-09-30 — End: 2014-10-03

## 2014-09-30 MED ORDER — HYDROXYZINE HCL 50 MG PO TABS
50.0000 mg | ORAL_TABLET | Freq: Three times a day (TID) | ORAL | Status: DC | PRN
  Administered 2014-10-02: 50 mg via ORAL
  Filled 2014-09-30: qty 1
  Filled 2014-09-30: qty 20

## 2014-09-30 MED ORDER — FLUOXETINE HCL 20 MG PO CAPS
60.0000 mg | ORAL_CAPSULE | Freq: Every day | ORAL | Status: DC
  Administered 2014-10-01 – 2014-10-03 (×3): 60 mg via ORAL
  Filled 2014-09-30 (×4): qty 3
  Filled 2014-09-30: qty 42
  Filled 2014-09-30: qty 3

## 2014-09-30 MED ORDER — LORAZEPAM 1 MG PO TABS
1.0000 mg | ORAL_TABLET | Freq: Three times a day (TID) | ORAL | Status: DC | PRN
  Administered 2014-10-01: 1 mg via ORAL
  Filled 2014-09-30: qty 1

## 2014-09-30 MED ORDER — ALUM & MAG HYDROXIDE-SIMETH 200-200-20 MG/5ML PO SUSP
30.0000 mL | ORAL | Status: DC | PRN
  Administered 2014-10-02: 30 mL via ORAL
  Filled 2014-09-30: qty 30

## 2014-09-30 MED ORDER — POTASSIUM CHLORIDE CRYS ER 20 MEQ PO TBCR
20.0000 meq | EXTENDED_RELEASE_TABLET | Freq: Once | ORAL | Status: AC
  Administered 2014-09-30: 20 meq via ORAL
  Filled 2014-09-30: qty 1

## 2014-09-30 MED ORDER — MAGNESIUM HYDROXIDE 400 MG/5ML PO SUSP
30.0000 mL | Freq: Every day | ORAL | Status: DC | PRN
Start: 2014-09-30 — End: 2014-10-03

## 2014-09-30 MED ORDER — ACETAMINOPHEN 325 MG PO TABS: 650.0000 mg | ORAL_TABLET | Freq: Four times a day (QID) | ORAL | Status: DC | PRN

## 2014-09-30 MED ORDER — TRAZODONE HCL 50 MG PO TABS
50.0000 mg | ORAL_TABLET | Freq: Every day | ORAL | Status: DC
  Administered 2014-09-30 – 2014-10-01 (×2): 50 mg via ORAL
  Filled 2014-09-30 (×3): qty 1
  Filled 2014-09-30: qty 14
  Filled 2014-09-30: qty 1

## 2014-09-30 NOTE — ED Notes (Signed)
Pt. Noted sleeping in room. No complaints or concerns voiced. No distress or abnormal behavior noted. Will continue to monitor with security cameras. Q 15 minute rounds continue. 

## 2014-09-30 NOTE — [facility] Group Notes (Signed)
[facility] [place] Group Notes:  (Clinical Social Work)  09/30/2014  [facility] Group Notes:  (Clinical Social Work)  09/30/2014  11:00AM-12:00PM  Summary of Progress/Problems:  The main focus of today's process group was to listen to a variety of genres of music and to identify that different types of music provoke different responses.  The patient then was able to identify personally what was soothing for them, as well as energizing.  Handouts were used to record feelings evoked, as well as how patient can personally use this knowledge in sleep habits, with depression, and with other symptoms.  The patient expressed understanding of concepts, as well as knowledge of how each type of music affected her and how this can be used at home as a wellness/recovery tool.  She already uses music at home to help her be happy, said she listens to a lot of Roe Rutherford, [place]on Axe and Mozart music.  Type of Therapy:  Music Therapy   Participation Level:  Active  Participation Quality:  Attentive and Sharing  Affect:  Blunted  Cognitive:  Oriented  Insight:  Engaged  Engagement in Therapy:  Engaged  Modes of Intervention:   Activity, Exploration  Selmer Dominion, LCSW 09/30/2014

## 2014-09-30 NOTE — H&P (Signed)
Psychiatric Admission Assessment Adult  Patient Identification: Kim [name] No MRN:  [id number] Date of Evaluation:  09/30/2014 Chief Complaint:  "My neighbors just do not understand the artwork in my yard!"  Principal Diagnosis: Bipolar affective disorder, current episode manic with psychotic symptoms Diagnosis:   Patient Active Problem List   Diagnosis Date Noted  . Bipolar affective disorder, current episode manic with psychotic symptoms [F31.2] 09/30/2014  . Bipolar I disorder, most recent episode (or current) manic, moderate [F31.12] 11/09/2011  . Cocaine abuse [F14.10] 11/09/2011   History of Present Illness::  Kim [name] is a 54 year old female with a history of ADD, Bipolar Disorder who was brought to the [facility] by [facility] under IVC papers. She is reported to have been terrorizing her neighbors with threatening messages and provokes them on a daily basis. The [employer] have reported that they have dealt with patient on a daily basis and that she has been charged criminally 102 times since May 15, 2009. Ling appears very manic during assessment today with rapid and pressured speech. She appears to have no insight into how her behaviors are perceived by others. Patient states "The neighbors can't accept my artistic abilities. I am an eccentric person and they can't understand that. They accuse me of using drugs like heroine and cocaine. I took some people in my house who I met at [employer] and tried to help them but then they left dirty needles all over my house. I was yelling because I needed someone to call 911. I have pretty sculptures in my yard. The neighbors do not like that either. I just need to express myself and I do it in my own yard. They are the ones that need to be here. Not me! 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 Her power of attorney was called while  patient was at [facility] for collateral information. He reported that the patient inherited 1.5 million after her mother's death in 2008-05-15 but has depleted the money on drugs. The patient is insistent that all the problems are related to her neighbors. This appears to be delusional according to the collateral information. The patient describes a bizarre sculpture in her yard that consists of "decorations with dog collars and a bicycle."   Elements:  Location: Bipolar disorder, manic Quality: severe. Severity: severe. Timing: acute. Duration: Chronic mental illness.. Context: IVC by [employer] for bizarre behavior..  Associated Signs/Symptoms: Depression Symptoms:  Denies (Hypo) Manic Symptoms:  Delusions, Distractibility, Elevated Mood, Flight of Ideas, Grandiosity, Impulsivity, Labiality of Mood, Anxiety Symptoms:  Denies Psychotic Symptoms:  Denies PTSD Symptoms: Negative Total Time spent with patient: 1 hour  Past Medical History:  Past Medical History  Diagnosis Date  . Headache(784.0)   . ADHD (attention deficit hyperactivity disorder)   . 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 Knee surgery      right   Family History: History reviewed. No pertinent family history. Social History:  History  Alcohol Use No    Comment: "every 6 mos"     History  Drug Use  . Yes  . Special: Cocaine    History   Social History  . Marital Status: Single    Spouse Name: N/A  . Number of Children: N/A  . Years of Education: N/A   Social  History Main Topics  . Smoking status: Never Smoker   . Smokeless tobacco: Not on file  . Alcohol Use: No     Comment: "every 6 mos"  . Drug Use: Yes    Special: Cocaine  . Sexual Activity: No   Other Topics Concern  . None   Social History Narrative   Additional Social History:    Pain Medications: none Prescriptions: none Over the Counter: none History of alcohol /  drug use?: No history of alcohol / drug abuse                     Musculoskeletal: Strength & Muscle Tone: within normal limits Gait & Station: normal Patient leans: N/A  Psychiatric Specialty Exam: Physical Exam  Constitutional:  Complete physical exam performed at [facility] and I concur with no noted exceptions.   Psychiatric: Her mood appears anxious. Her affect is labile. Her speech is rapid and/or pressured. She is hyperactive. Thought content is delusional. Cognition and memory are impaired. She expresses impulsivity.    Review of Systems  Constitutional: Negative.   HENT: Negative.   Eyes: Negative.   Respiratory: Negative.   Cardiovascular: Negative.   Gastrointestinal: Negative.   Genitourinary: Negative.   Musculoskeletal: Negative.   Skin: Negative.   Neurological: Negative.   Endo/Heme/Allergies: Negative.   Psychiatric/Behavioral: Positive for substance abuse (Tested positive for cocaine on 09/26/14 ). The patient is nervous/anxious and has insomnia.     Blood pressure 121/77, pulse 104, temperature 97.8 F (36.6 C), temperature source Oral, resp. rate 18, height _0  (1.575 m), weight 62.143 kg (137 lb).Body mass index is 25.05 kg/(m^2).  General Appearance: Casual  Eye Contact::  Good  Speech:  Pressured  Volume:  Normal  Mood:  Euphoric  Affect:  Labile  Thought Process:  Circumstantial and Tangential  Orientation:  Full (Time, Place, and Person)  Thought Content:  Paranoid Ideation  Suicidal Thoughts:  No  Homicidal Thoughts:  No  Memory:  Immediate;   Good Recent;   Fair Remote;   Fair  Judgement:  Impaired  Insight:  Lacking  Psychomotor Activity:  Increased and Restlessness  Concentration:  Fair  Recall:  [employer] of Knowledge:Fair  Language: Good  Akathisia:  No  Handed:  Right  AIMS (if indicated):     Assets:  Communication Skills Desire for Improvement Housing Leisure Time Physical Health Resilience  ADL's:  Intact  Cognition:  Impaired,  Moderate  Sleep:      Risk to Self: Is patient at risk for suicide?: No Risk to Others:   Prior Inpatient Therapy:   Prior Outpatient Therapy:    Alcohol Screening: 1. How often do you have a drink containing alcohol?: Never 9. Have you or someone else been injured as a result of your drinking?: No 10. Has a relative or friend or a doctor or another health worker been concerned about your drinking or suggested you cut down?: No Alcohol Use Disorder Identification Test Final Score (AUDIT): 0 Brief Intervention: AUDIT score less than 7 or less-screening does not suggest unhealthy drinking-brief intervention not indicated  Allergies:   Allergies  Allergen Reactions  . Penicillins     Unknown reaction.   Lab Results:  Results for orders placed or performed during the hospital encounter of 09/26/14 (from the past 48 hour(s))  Urine rapid drug screen (hosp performed)     Status: None   Collection Time: 09/29/14  6:30 PM  Result Value Ref Range  Opiates NONE DETECTED NONE DETECTED   Cocaine NONE DETECTED NONE DETECTED   Benzodiazepines NONE DETECTED NONE DETECTED   Amphetamines NONE DETECTED NONE DETECTED   Tetrahydrocannabinol NONE DETECTED NONE DETECTED   Barbiturates NONE DETECTED NONE DETECTED    Comment:        DRUG SCREEN FOR MEDICAL PURPOSES ONLY.  IF CONFIRMATION IS NEEDED FOR ANY PURPOSE, NOTIFY LAB WITHIN 5 DAYS.        LOWEST DETECTABLE LIMITS FOR URINE DRUG SCREEN Drug Class       Cutoff (ng/mL) Amphetamine      1000 Barbiturate      200 Benzodiazepine   017 Tricyclics       494 Opiates          300 Cocaine          300 THC              50    Current Medications: Current Facility-Administered Medications  Medication Dose Route Frequency Provider Last Rate Last Dose  . acetaminophen (TYLENOL) tablet 650 mg  650 mg Oral Q6H PRN Patrecia Pour, NP      . alum & mag hydroxide-simeth (MAALOX/MYLANTA) 200-200-20 MG/5ML suspension 30 mL  30 mL Oral Q4H  PRN Patrecia Pour, NP      . carbamazepine (TEGRETOL) tablet 200 mg  200 mg Oral BID PC Patrecia Pour, NP   200 mg at 09/30/14 1654  . FLUoxetine (PROZAC) capsule 60 mg  60 mg Oral Daily Patrecia Pour, NP   60 mg at 09/30/14 1654  . hydrOXYzine (ATARAX/VISTARIL) tablet 50 mg  50 mg Oral TID PRN Patrecia Pour, NP      . ibuprofen (ADVIL,MOTRIN) tablet 600 mg  600 mg Oral Q6H PRN Patrecia Pour, NP      . LORazepam (ATIVAN) tablet 1 mg  1 mg Oral Q8H PRN Patrecia Pour, NP      . magnesium hydroxide (MILK OF MAGNESIA) suspension 30 mL  30 mL Oral Daily PRN Patrecia Pour, NP      . traZODone (DESYREL) tablet 50 mg  50 mg Oral QHS Patrecia Pour, NP       PTA Medications: Prescriptions prior to admission  Medication Sig Dispense Refill Last Dose  . Aspirin-Acetaminophen-Caffeine (EXCEDRIN MIGRAINE PO) Take 2 tablets by mouth daily as needed (headache).   Unknown at Unknown time  . calcium carbonate (TUMS - DOSED IN MG ELEMENTAL CALCIUM) 500 MG chewable tablet Chew 2-3 tablets by mouth daily as needed for heartburn.   Unknown at Unknown time  . carbamazepine (TEGRETOL XR) 200 MG 12 hr tablet Take 1 tablet (200 mg) in the am and 2 tablets (470m) at bedtime for mood (Patient not taking: Reported on 09/26/2014) 90 tablet 0 Unknown at Unknown time  . doxepin (SINEQUAN) 25 MG capsule Take 25 mg by mouth daily.   Unknown at Unknown time  . FLUoxetine (PROZAC) 20 MG capsule Take 60 mg by mouth daily.   Unknown at Unknown time  . ibuprofen (ADVIL,MOTRIN) 200 MG tablet Take 400 mg by mouth every 6 (six) hours as needed for moderate pain.   Unknown at Unknown time  . RaNITidine HCl (ZANTAC PO) Take 1 tablet by mouth daily as needed (heartburn).   Unknown at Unknown time    Previous Psychotropic Medications: Yes   Substance Abuse History in the last 12 months:  Yes.   History of cocaine abuse with positive urine drug screen  Consequences of Substance Abuse: Worsening manic symptoms related to  cocaine use  Results for orders placed or performed during the hospital encounter of 09/26/14 (from the past 72 hour(s))  Urine rapid drug screen (hosp performed)     Status: None   Collection Time: 09/29/14  6:30 PM  Result Value Ref Range   Opiates NONE DETECTED NONE DETECTED   Cocaine NONE DETECTED NONE DETECTED   Benzodiazepines NONE DETECTED NONE DETECTED   Amphetamines NONE DETECTED NONE DETECTED   Tetrahydrocannabinol NONE DETECTED NONE DETECTED   Barbiturates NONE DETECTED NONE DETECTED    Comment:        DRUG SCREEN FOR MEDICAL PURPOSES ONLY.  IF CONFIRMATION IS NEEDED FOR ANY PURPOSE, NOTIFY LAB WITHIN 5 DAYS.        LOWEST DETECTABLE LIMITS FOR URINE DRUG SCREEN Drug Class       Cutoff (ng/mL) Amphetamine      1000 Barbiturate      200 Benzodiazepine   765 Tricyclics       465 Opiates          300 Cocaine          300 THC              50     Observation Level/Precautions:  15 minute checks  Laboratory:  CBC Chemistry Profile UDS  Psychotherapy:  Individual and Group Therapy  Medications:  Continue Tegretol 200 mg BID for mood lability, Prozac 60 mg daily for affective symptoms, Trazodone 50 mg hs for insomnia   Consultations:  Psychiatry   Discharge Concerns:  Emotional stability   Estimated LOS: 3-5 days  Other:  Continue to increase collateral information   Psychological Evaluations: Yes   Treatment Plan Summary: Daily contact with patient to assess and evaluate symptoms and progress in treatment and Medication management  Treatment Plan/Recommendations:   1. Admit for crisis management and stabilization. Estimated length of stay 3-5 days. 2. Medication management to reduce current symptoms to base line and improve the patient's level of functioning.  3. Develop treatment plan to decrease risk of relapse upon discharge of manic symptoms and the need for readmission. 5. Group therapy to facilitate development of healthy coping skills to use for mood  lability. 6. Health care follow up as needed for medical problems.  7. Discharge plan to include therapy to help patient cope with  stressors.  8. Call for Consult with Hospitalist for additional specialty patient services as needed.   Medical Decision Making:  Review of Psycho-Social Stressors (1), Review or order clinical lab tests (1), Established Problem, Worsening (2), Review of Medication Regimen & Side Effects (2) and Review of New Medication or Change in Dosage (2)  I certify that inpatient services furnished can reasonably be expected to improve the patient's condition.   Elmarie Shiley NP-C 7/17/20166:09 PM I personally assessed the patient, reviewed the physical exam and labs and formulated the treatment plan Geralyn Flash A. [name] Heck, M.D.

## 2014-09-30 NOTE — Progress Notes (Signed)
Pt accepted to [facility] bed 501-1 per Randall Hiss, [facility].  Sharren Bridge, MSW, [facility], Disposition  09/30/2014 [phone]

## 2014-09-30 NOTE — ED Notes (Signed)
Pt ambulatory w/ [employer] to [facility], belongings given to officer

## 2014-09-30 NOTE — Tx Team (Signed)
Initial Interdisciplinary Treatment Plan   PATIENT STRESSORS: Medication change or noncompliance Substance abuse   PATIENT STRENGTHS: Ability for insight General fund of knowledge   PROBLEM LIST: Problem List/Patient Goals Date to be addressed Date deferred Reason deferred Estimated date of resolution  depression 09/30/14                                                      DISCHARGE CRITERIA:  Ability to meet basic life and health needs Improved stabilization in mood, thinking, and/or behavior  PRELIMINARY DISCHARGE PLAN: Return to previous living arrangement  PATIENT/FAMIILY INVOLVEMENT: This treatment plan has been presented to and reviewed with the patient, Mahogony Gilchrest, and/or family member.  The patient and family have been given the opportunity to ask questions and make suggestions.  Benancio Deeds Shanta 09/30/2014, 11:59 AM

## 2014-09-30 NOTE — Progress Notes (Signed)
8:57am. [facility] AC, Eric, called CSW to inform that pt has been accepted to bed 501-1. Pt can go over once NP finishes transfer paperwork. Pt is IVC'd, so will need law enforcement transport.   [place] Worker [facility] phone: [phone]

## 2014-09-30 NOTE — ED Notes (Signed)
[employer] contacted for transport 

## 2014-09-30 NOTE — ED Notes (Signed)
Pt. Noted in room. No complaints or concerns voiced. No distress or abnormal behavior noted. Will continue to monitor with security cameras. Q 15 minute rounds continue. 

## 2014-09-30 NOTE — ED Notes (Signed)
Pt up to the desk and reports that she did hang a "g-string" up in her yard w/ a sign about her winning a contest "as a joke."

## 2014-09-30 NOTE — [facility] Suicide Risk Assessment (Signed)
[facility] Admission Suicide Risk Assessment   Nursing information obtained from:  Patient Demographic factors:  Caucasian, Living alone Current Mental Status:  NA Loss Factors:  NA Historical Factors:  NA Risk Reduction Factors:  NA Total Time spent with patient: 45 minutes Principal Problem: Bipolar affective disorder, current episode manic with psychotic symptoms Diagnosis:   Patient Active Problem List   Diagnosis Date Noted  . Bipolar affective disorder, current episode manic with psychotic symptoms [F31.2] 09/30/2014  . Bipolar I disorder, most recent episode (or current) manic, moderate [F31.12] 11/09/2011  . Cocaine abuse [F14.10] 11/09/2011     Continued Clinical Symptoms:  Alcohol Use Disorder Identification Test Final Score (AUDIT): 0 The "Alcohol Use Disorders Identification Test", Guidelines for Use in Primary Care, Second Edition.  World Pharmacologist [facility]). Score between 0-7:  no or low risk or alcohol related problems. Score between 8-15:  moderate risk of alcohol related problems. Score between 16-19:  high risk of alcohol related problems. Score 20 or above:  warrants further diagnostic evaluation for alcohol dependence and treatment.   CLINICAL FACTORS: Bipolar Disorder manic     Musculoskeletal: Strength & Muscle Tone: within normal limits Gait & Station: normal Patient leans: normal  Psychiatric Specialty Exam: Physical Exam  Review of Systems  Constitutional: Negative.   Eyes: Positive for blurred vision.  Respiratory: Negative.   Cardiovascular: Negative.   Gastrointestinal: Negative.   Genitourinary: Negative.   Musculoskeletal: Negative.   Skin: Negative.   Neurological: Positive for headaches.  Endo/Heme/Allergies: Negative.   Psychiatric/Behavioral: Positive for depression. The patient is nervous/anxious.     Blood pressure 121/77, pulse 104, temperature 97.8 F (36.6 C), temperature source Oral, resp. rate 18, height 5\' 2"  (1.575 m),  weight 62.143 kg (137 lb).Body mass index is 25.05 kg/(m^2).  General Appearance: Disheveled  Eye Sport and exercise psychologist::  Fair  Speech:  Clear and Coherent and Pressured  Volume:  fluctuates  Mood:  Euphoric  Affect:  Labile and expansive  Thought Process:  Coherent and Goal Directed  Orientation:  Full (Time, Place, and Person)  Thought Content:  worries concerns events that happened some undrlying paranois  Suicidal Thoughts:  No  Homicidal Thoughts:  No  Memory:  Immediate;   Fair Recent;   Fair Remote;   Fair  Judgement:  Impaired  Insight:  Lacking  Psychomotor Activity:  Increased and Restlessness  Concentration:  Fair  Recall:  [employer] of Knowledge:Fair  Language: Fair  Akathisia:  No  Handed:  Right  AIMS (if indicated):     Assets:  Desire for Improvement Housing  Sleep:     Cognition: WNL  ADL's:  Intact     COGNITIVE FEATURES THAT CONTRIBUTE TO RISK:  Closed-mindedness, Polarized thinking and Thought constriction (tunnel vision)    SUICIDE RISK:   Moderate:  Frequent suicidal ideation with limited intensity, and duration, some specificity in terms of plans, no associated intent, good self-control, limited dysphoria/symptomatology, some risk factors present, and identifiable protective factors, including available and accessible social support. 54 Y/o female with a diagnoses of Bipolar Disorder who was apparently involuntarily committed as she exhibited irrational behavior and the neighbors were concerned. She herself states that she is whimsical and eccentric and they do not understand it. She admits she has artwork in her yard that they don't like. States she has a non Office manager it on." she states she has invested most of her inheritance in this non profit that tries to help children pets. She states that  she was trying to help some homeless people and they use heroin in her sun porch. She found two used needles there. She says the neighbors accused her of  doing heroin. She states she had to pull time as she left the state without notifying the PO and she had to spend from Oct to June 2016. She was on probation for cocaine charges. She says she sees Jolyn Lent at [facility] and that she takes Prozac  PLAN OF CARE: Supportive approach/coping skills                              Mood instability; resume the Tegretol get a Tegretol level and optimize response                               Will continue the Prozac                               Will get collateral information  Medical Decision Making:  Review of Psycho-Social Stressors (1), Review or order clinical lab tests (1), Review of Medication Regimen & Side Effects (2) and Review of New Medication or Change in Dosage (2)  I certify that inpatient services furnished can reasonably be expected to improve the patient's condition.   [place] A 09/30/2014, 8:33 PM

## 2014-09-30 NOTE — ED Notes (Signed)
[employer] is here to transport 

## 2014-09-30 NOTE — Progress Notes (Signed)
[facility] Group Notes:  (Nursing/MHT/Case Management/Adjunct)  Date:  09/30/2014  Time:  8:54 PM  Type of Therapy:  Psychoeducational Skills  Participation Level:  Active  Participation Quality:  Monopolizing  Affect:  Excited  Cognitive:  Disorganized  Insight:  Limited  Engagement in Group:  Developing/Improving and Monopolizing  Modes of Intervention:  Education  Summary of Progress/Problems: The patient spoke at great length in group this evening. She indicated that she was pleased to be here versus remaining in the hospital. She also mentioned that she misses her pets and would like to go home soon. The patient briefly stated that she is "manic" but would not go into further detail. As a theme for the day, her support system will consist of God, her parents, and her pets.   Kim [name] S 09/30/2014, 8:54 PM

## 2014-09-30 NOTE — Progress Notes (Signed)
Patient ID: Kim [name], female   DOB: 24-Feb-1961, 54 y.o.   MRN: [id number]   54 year old white female admitted after she presented to [facility] with [facility] after being IVC'ed due to threatening her neighbors. It was reported that the patient was put signs in the driveway of her neighbors, and yelling obscene things. Pt reported that her neighbors called the cops on her because she is eccentric and a free spirit. Pt reported that she has had inpt treatment here at [facility] twice in the past. Pt reported that she lives alone with her cat and dog, and that they are all that she needs. Pt was very manic at time of admission and was very intrusive. Pt required frequent redirection and did not respond well. Pt was very grandiose, she reported that she was running for sheriff. Pt reported that she was going to be the first women sheriff in [facility], however she was not going to be able to carry a gun because she has a charge. Pt reported that she uses cocaine once a month, because without the cocaine she was a mean women. Pt reported that cocaine was for her periods. At time of admission patient reported being negative SI/HI, no AH/VH noted.

## 2014-10-01 LAB — CARBAMAZEPINE LEVEL, TOTAL: CARBAMAZEPINE LVL: 10 ug/mL (ref 4.0–12.0)

## 2014-10-01 NOTE — Progress Notes (Signed)
D: Pt has anxious affect and mood.  Pt reports her goal is "to get out, my dog is upset, his name is Kim [name], [name] get him certified."  Pt reported she hopes to leave tomorrow and said that this may not happen because "they think I'm a little manic, this is how I am when I'm happy though."  Pt then started talking about how she stubbed her middle toe on her right foot prior to admission.  Pt reported pain of 3/10 on that toe.  Pt denies SI/HI, denies hallucinations. Pt has been visible in milieu interacting with peers.  Pt attended evening group.   A: Introduced self to pt.  Met with pt 1:1 and provided support and encouragement.  Actively listened to pt.  Medications administered per order.  R: Pt is compliant with medications.  Pt verbally contracts for safety.  Will continue to monitor and assess.

## 2014-10-01 NOTE — Progress Notes (Signed)
The focus of this group is to help patients review their daily goal of treatment and discuss progress on daily workbooks. Pt attended the evening group session and responded to all discussion prompts from the [employer]. Pt shared that today was a good day on the unit, the highlight of which was feeling less depressed and more "even keel" than in previous days. "This isn't all of the sudden. I've been feeling better these past few days." Pt mentioned both wanting and needing to go home soon, citing a dog that she missed and wanted to return to. "My neighbor is going over three times a day checking on her, but that's just not enough." Pt reported having no additional needs from Nursing Staff this evening. Pt's affect was appropriate.

## 2014-10-01 NOTE — Progress Notes (Signed)
D   Pt is pleasant on approach and cooperative   She talked about her pets and how her dog is tearing up her house because she isnt home  And how she really needs to be discharged   She takes her medications without any problem and attends groups A   Verbal support given   Medications administered and effectiveness monitored   Q 15 min checks R   Pt safe at present
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
## 2014-10-01 NOTE — [facility] Group Notes (Signed)
Kim [name] [facility] LCSW Aftercare Discharge Planning Group Note   10/01/2014 3:55 PM  Participation Quality:  Engaged  Mood/Affect:  Appropriate  Depression Rating:  denies  Anxiety Rating:  denies  Thoughts of Suicide:  No Will you contract for safety?   NA  Current AVH:  Denies  Plan for Discharge/Comments:  "My neighbors are scare of me because I am artistic.  The police know me well because my weird neighbors call them on me all the time."  States she gets medication management from Dr Rowe Robert at [facility].  Either lacks insight or is unwilling to say what is truly going on with her.   Transportation Means:   Supports:  Roque Lias B
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
## 2014-10-01 NOTE — Progress Notes (Addendum)
Patient ID: Kim [name], female   DOB: Mar 17, 1960, 54 y.o.   MRN: [id number]---   Pt. Denies pain or dis-comfort this shift.  She attends groups and interacts well with peers.  She stayed in bed most of morning , but then got up and out on unit.  She appears slightly anxious and is beging to focus on DC.  She appears dependant on her next door neighbor and views her as a mother figure.   This neighbor is requesting to speak with pts. Dr. About DC plans.    Pt. Has minimal eye contact and speaks in a rapid, compressed style.  She agrees to contract for safety and takes scheduled medications as requested.  Pt.  Continues to blame other people for her being in the hospital and is unable to acknowledge her role in  Being admitted  --- A ---  Support, safety cks, meds and encouragement provided.  --- R --  Pt. Remains safe on unit

## 2014-10-01 NOTE — [facility] Group Notes (Signed)
[facility] LCSW Group Therapy  10/01/2014 4:03 PM   Type of Therapy:  Group Therapy  Participation Level:  Invited.  Chose to not attend  Summary of Progress/Problems: Today's group focused on relapse prevention.  We defined the term, and then brainstormed on ways to prevent relapse.    Roque Lias B 10/01/2014 , 4:03 PM

## 2014-10-01 NOTE — Progress Notes (Signed)
[facility] MD Progress Note  10/01/2014 3:12 PM Kim [name]  MRN:  [id number] Subjective:  Pt interviewed. Chart reviewed. Pt reports doing well since being in the hospital. Sleep/appetite good. Pt denies acute concerns. Pt reports using a small amount of cocaine last week. Pt denies ever using heroin. Tolerating meds well. Pt denies SI/HI/AVH/paranoia.  Principal Problem: Bipolar affective disorder, current episode manic with psychotic symptoms Diagnosis:   Patient Active Problem List   Diagnosis Date Noted  . Bipolar affective disorder, current episode manic with psychotic symptoms [F31.2] 09/30/2014  . Bipolar I disorder, most recent episode (or current) manic, moderate [F31.12] 11/09/2011  . Cocaine abuse [F14.10] 11/09/2011   Total Time spent with patient: 20 minutes   Past Medical History:  Past Medical History  Diagnosis Date  . Headache(784.0)   . ADHD (attention deficit hyperactivity disorder)   . 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 Knee surgery      right   Family History: History reviewed. No pertinent family history. Social History:  History  Alcohol Use No    Comment: "every 6 mos"     History  Drug Use  . Yes  . Special: Cocaine    History   Social History  . Marital Status: Single    Spouse Name: N/A  . Number of Children: N/A  . Years of Education: N/A   Social History Main Topics  . Smoking status: Never Smoker   . Smokeless tobacco: Not on file  . Alcohol Use: No     Comment: "every 6 mos"  . Drug Use: Yes    Special: Cocaine  . Sexual Activity: No   Other Topics Concern  . None   Social History Narrative   Additional History:    Sleep: Fair  Appetite:  Fair   Assessment:   Musculoskeletal: Strength & Muscle Tone: within normal limits Gait & Station: normal Patient leans: N/A   Psychiatric Specialty Exam: Physical Exam  ROS  Blood pressure 130/95, pulse 100,  temperature 98.4 F (36.9 C), temperature source Oral, resp. rate 16, height 5\' 2"  (1.575 m), weight 62.143 kg (137 lb).Body mass index is 25.05 kg/(m^2).  General Appearance: Casual and Disheveled  Eye Contact::  Fair  Speech:  Clear and Coherent and Normal Rate  Volume:  Normal  Mood:  Euthymic  Affect:  Congruent and Full Range  Thought Process:  Tangential  Orientation:  Full (Time, Place, and Person)  Thought Content:  Negative  Suicidal Thoughts:  No  Homicidal Thoughts:  No  Memory:  Immediate;   Fair Recent;   Poor Remote;   Fair  Judgement:  Impaired  Insight:  Shallow  Psychomotor Activity:  Normal  Concentration:  Fair  Recall:  Eschbach: Fair  Akathisia:  Negative  Handed:  Left  AIMS (if indicated):     Assets:  Communication Skills Desire for Improvement Housing Physical Health Social Support  ADL's:  Intact  Cognition: WNL  Sleep:  Number of Hours: 6.5     Current Medications: Current Facility-Administered Medications  Medication Dose Route Frequency Provider Last Rate Last Dose  . acetaminophen (TYLENOL) tablet 650 mg  650 mg Oral Q6H PRN Patrecia Pour, NP      . alum & mag hydroxide-simeth (MAALOX/MYLANTA) 200-200-20 MG/5ML suspension 30 mL  30 mL Oral Q4H PRN Patrecia Pour, NP      .  carbamazepine (TEGRETOL) tablet 200 mg  200 mg Oral BID PC Patrecia Pour, NP   200 mg at 10/01/14 0820  . FLUoxetine (PROZAC) capsule 60 mg  60 mg Oral Daily Patrecia Pour, NP   60 mg at 10/01/14 0740  . hydrOXYzine (ATARAX/VISTARIL) tablet 50 mg  50 mg Oral TID PRN Patrecia Pour, NP      . ibuprofen (ADVIL,MOTRIN) tablet 600 mg  600 mg Oral Q6H PRN Patrecia Pour, NP      . LORazepam (ATIVAN) tablet 1 mg  1 mg Oral Q8H PRN Patrecia Pour, NP      . magnesium hydroxide (MILK OF MAGNESIA) suspension 30 mL  30 mL Oral Daily PRN Patrecia Pour, NP      . traZODone (DESYREL) tablet 50 mg  50 mg Oral QHS Patrecia Pour, NP   50 mg at 09/30/14  2100    Lab Results:  Results for orders placed or performed during the hospital encounter of 09/30/14 (from the past 48 hour(s))  Carbamazepine level, total     Status: None   Collection Time: 10/01/14  6:20 AM  Result Value Ref Range   Carbamazepine Lvl 10.0 4.0 - 12.0 ug/mL    Comment: Performed at [facility]    Physical Findings: AIMS: Facial and Oral Movements Muscles of Facial Expression: None, normal Lips and Perioral Area: None, normal Jaw: None, normal Tongue: None, normal,Extremity Movements Upper (arms, wrists, hands, fingers): None, normal Lower (legs, knees, ankles, toes): None, normal, Trunk Movements Neck, shoulders, hips: None, normal, Overall Severity Severity of abnormal movements (highest score from questions above): None, normal Incapacitation due to abnormal movements: None, normal Patient's awareness of abnormal movements (rate only patient's report): No Awareness,    CIWA:    COWS:     Treatment Plan Summary: Daily contact with patient to assess and evaluate symptoms and progress in treatment, Medication management and Plan continue current med regimen as is (tegretol, prozac, trazodone), since effective and tolerating. Tegretol level is therapeutic (10). Pt requesting to be discharged soon.   Medical Decision Making:  Established Problem, Stable/Improving (1), Review of Psycho-Social Stressors (1), Review or order clinical lab tests (1) and Review of Medication Regimen & Side Effects (2)     Siarra Gilkerson 10/01/2014, 3:12 PM

## 2014-10-01 NOTE — Plan of Care (Signed)
Problem: Alteration in mood & ability to function due to Goal: STG-Patient will comply with prescribed medication regimen (Patient will comply with prescribed medication regimen) Outcome: Progressing Pt has been compliant with medications this shift.

## 2014-10-02 MED ORDER — LISINOPRIL 20 MG PO TABS
20.0000 mg | ORAL_TABLET | Freq: Once | ORAL | Status: AC
  Administered 2014-10-02: 20 mg via ORAL
  Filled 2014-10-02 (×2): qty 1

## 2014-10-02 MED ORDER — LISINOPRIL 10 MG PO TABS
10.0000 mg | ORAL_TABLET | Freq: Every day | ORAL | Status: DC
  Administered 2014-10-03: 10 mg via ORAL
  Filled 2014-10-02: qty 1
  Filled 2014-10-02: qty 14
  Filled 2014-10-02: qty 1
  Filled 2014-10-02: qty 2

## 2014-10-02 NOTE — Progress Notes (Signed)
D   Pt is pleasant on approach and cooperative   She talked about her pets and how her dog is tearing up her house because she isnt home  And how she really needs to be discharged   She takes her medications without any problem and attends groups  Pt spent most of the shift in bed and did not take her sleep medication A   Verbal support given   Medications administered and effectiveness monitored   Q 15 min checks R   Pt safe at present

## 2014-10-02 NOTE — Plan of Care (Signed)
Problem: Ineffective individual coping Goal: STG: Patient will remain free from self harm Outcome: Progressing Pt remains safe on Q 15 minutes checks as ordered without gestures or event of self injurious behavior to report at this time.

## 2014-10-02 NOTE — Progress Notes (Signed)
[facility] MD Progress Note  10/02/2014 9:42 PM Kim [name]  MRN:  [id number] Subjective:  Kim [name] states that she is feeling much better. She feels ready to be D/C soon. She states there is a lot of cleaning to be done in the yard. States she does not plan to have any arguments with the neighbors. She states she is not going to allow any more of the homeless people in her house. States she did not expect them to leave the needles in her sun porch. She wants to continue to help but will do it trough agencies. Principal Problem: Bipolar affective disorder, current episode manic with psychotic symptoms Diagnosis:   Patient Active Problem List   Diagnosis Date Noted  . Bipolar affective disorder, current episode manic with psychotic symptoms [F31.2] 09/30/2014  . Bipolar I disorder, most recent episode (or current) manic, moderate [F31.12] 11/09/2011  . Cocaine abuse [F14.10] 11/09/2011   Total Time spent with patient: 30 minutes   Past Medical History:  Past Medical History  Diagnosis Date  . Headache(784.0)   . ADHD (attention deficit hyperactivity disorder)   . 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 Knee surgery      right   Family History: History reviewed. No pertinent family history. Social History:  History  Alcohol Use No    Comment: "every 6 mos"     History  Drug Use  . Yes  . Special: Cocaine    History   Social History  . Marital Status: Single    Spouse Name: N/A  . Number of Children: N/A  . Years of Education: N/A   Social History Main Topics  . Smoking status: Never Smoker   . Smokeless tobacco: Not on file  . Alcohol Use: No     Comment: "every 6 mos"  . Drug Use: Yes    Special: Cocaine  . Sexual Activity: No   Other Topics Concern  . None   Social History Narrative   Additional History:    Sleep: Fair  Appetite:  Fair   Assessment:   Musculoskeletal: Strength & Muscle Tone:  within normal limits Gait & Station: normal Patient leans: normal   Psychiatric Specialty Exam: Physical Exam  Review of Systems  Constitutional: Negative.   HENT: Negative.   Eyes: Negative.   Respiratory: Negative.   Cardiovascular: Negative.   Gastrointestinal: Negative.   Genitourinary: Negative.   Musculoskeletal: Negative.   Skin: Negative.   Neurological: Negative.   Endo/Heme/Allergies: Negative.   Psychiatric/Behavioral: The patient is nervous/anxious.     Blood pressure 136/81, pulse 110, temperature 97.9 F (36.6 C), temperature source Oral, resp. rate 17, height 5\' 2"  (1.575 m), weight 62.143 kg (137 lb).Body mass index is 25.05 kg/(m^2).  General Appearance: Fairly Groomed  Engineer, water::  Fair  Speech:  Clear and Coherent  Volume:  Normal  Mood:  Euthymic  Affect:  Appropriate  Thought Process:  Coherent and Goal Directed  Orientation:  Full (Time, Place, and Person)  Thought Content:  plans as she anticipates being D/C soon  Suicidal Thoughts:  No  Homicidal Thoughts:  No  Memory:  Immediate;   Fair Recent;   Fair Remote;   Fair  Judgement:  Fair  Insight:  Present  Psychomotor Activity:  Normal  Concentration:  Fair  Recall:  [employer] of Knowledge:Fair  Language: Fair  Akathisia:  No  Handed:  Right  AIMS (if indicated):     Assets:  Desire for Improvement  ADL's:  Intact  Cognition: WNL  Sleep:  Number of Hours: 6.5     Current Medications: Current Facility-Administered Medications  Medication Dose Route Frequency Provider Last Rate Last Dose  . acetaminophen (TYLENOL) tablet 650 mg  650 mg Oral Q6H PRN Patrecia Pour, NP      . alum & mag hydroxide-simeth (MAALOX/MYLANTA) 200-200-20 MG/5ML suspension 30 mL  30 mL Oral Q4H PRN Patrecia Pour, NP   30 mL at 10/02/14 1125  . carbamazepine (TEGRETOL) tablet 200 mg  200 mg Oral BID PC Patrecia Pour, NP   200 mg at 10/02/14 1719  . FLUoxetine (PROZAC) capsule 60 mg  60 mg Oral Daily Patrecia Pour, NP   60 mg at 10/02/14 0805  . hydrOXYzine (ATARAX/VISTARIL) tablet 50 mg  50 mg Oral TID PRN Patrecia Pour, NP   50 mg at 10/02/14 1723  . ibuprofen (ADVIL,MOTRIN) tablet 600 mg  600 mg Oral Q6H PRN Patrecia Pour, NP      . Derrill Memo ON 10/03/2014] lisinopril (PRINIVIL,ZESTRIL) tablet 10 mg  10 mg Oral Daily Encarnacion Slates, NP      . LORazepam (ATIVAN) tablet 1 mg  1 mg Oral Q8H PRN Patrecia Pour, NP   1 mg at 10/01/14 2156  . magnesium hydroxide (MILK OF MAGNESIA) suspension 30 mL  30 mL Oral Daily PRN Patrecia Pour, NP      . traZODone (DESYREL) tablet 50 mg  50 mg Oral QHS Patrecia Pour, NP   50 mg at 10/01/14 2156    Lab Results:  Results for orders placed or performed during the hospital encounter of 09/30/14 (from the past 48 hour(s))  Carbamazepine level, total     Status: None   Collection Time: 10/01/14  6:20 AM  Result Value Ref Range   Carbamazepine Lvl 10.0 4.0 - 12.0 ug/mL    Comment: Performed at [facility]    Physical Findings: AIMS: Facial and Oral Movements Muscles of Facial Expression: None, normal Lips and Perioral Area: None, normal Jaw: None, normal Tongue: None, normal,Extremity Movements Upper (arms, wrists, hands, fingers): None, normal Lower (legs, knees, ankles, toes): None, normal, Trunk Movements Neck, shoulders, hips: None, normal, Overall Severity Severity of abnormal movements (highest score from questions above): None, normal Incapacitation due to abnormal movements: None, normal Patient's awareness of abnormal movements (rate only patient's report): No Awareness,    CIWA:    COWS:     Treatment Plan Summary: Daily contact with patient to assess and evaluate symptoms and progress in treatment and Medication management Supportive approach/coping skills Bipolar Disorder; continue the Tegretol (blood level is 10) Depression; continue the Prozac Substance abuse continue to work a relapse prevention plan CBT/mindfulness Reassess for  possible D/C in the AM  Medical Decision Making:  Review of Psycho-Social Stressors (1), Review or order clinical lab tests (1) and Review of Medication Regimen & Side Effects (2)     Kim [name] A 10/02/2014, 9:42 PM

## 2014-10-02 NOTE — [facility] Suicide Risk Assessment (Signed)
[facility] INPATIENT:  Family/Significant Other Suicide Prevention Education  Suicide Prevention Education:  Education Completed; No one has been identified by the patient as the family member/significant other with whom the patient will be residing, and identified as the person(s) who will aid the patient in the event of a mental health crisis (suicidal ideations/suicide attempt).  With written consent from the patient, the family member/significant other has been provided the following suicide prevention education, prior to the and/or following the discharge of the patient.  The suicide prevention education provided includes the following:  Suicide risk factors  Suicide prevention and interventions  National Suicide Hotline telephone number  [facility] assessment telephone number  [facility] Emergency Assistance [place] and/or Residential Mobile Crisis Unit telephone number  Request made of family/significant other to:  Remove weapons (e.g., guns, rifles, knives), all items previously/currently identified as safety concern.    Remove drugs/medications (over-the-counter, prescriptions, illicit drugs), all items previously/currently identified as a safety concern.  The family member/significant other verbalizes understanding of the suicide prevention education information provided.  The family member/significant other agrees to remove the items of safety concern listed above. The patient did not endorse SI at the time of admission, nor did the patient c/o SI during the stay here.  SPE not required.   Kim [name] 10/02/2014, 1:09 PM

## 2014-10-02 NOTE — Progress Notes (Signed)
The focus of this group is to help patients review their daily goal of treatment and discuss progress on daily workbooks. Pt did not attend the evening group. 

## 2014-10-02 NOTE — BHH Group Notes (Addendum)
[place] Group Notes:  (Nursing/MHT/Case Management/Adjunct)  Date:  10/02/2014  Time:  0930  Type of Therapy:  Psychoeducational Skills  Participation Level:  Active  Participation Quality:  Appropriate, Attentive, Sharing and Supportive  Affect:  Appropriate  Cognitive:  Alert, Appropriate and Oriented  Insight:  Appropriate and Good  Engagement in Group:  Engaged and Supportive  Modes of Intervention:  Discussion, Education, Orientation, Rapport Building and Support  Summary of Progress/Problems: Pt was engaged, verbally active during discussion and stayed on topic.   Meryle Ready, Nicoletta Dress 10/02/2014, 0930

## 2014-10-02 NOTE — Progress Notes (Addendum)
D: Pt visible in milieu at intervals during shift. Attended scheduled groups. Presents with flat affect and anxious mood. Denies SI, HI, AVH and pain when assessed. Cooperative with ward routines.  A: 1:1 contact made with pt to conduct assessments and addressed needs. All medications administered as per MD's orders. Pt encouraged to voice concerns. Support and availability offered. Assigned NP-Aggie informed of elevated BP and HR; new order received for Lisinopril 20 mg PO X 1 NOW and Lisinopril 10 mg PO daily to start in AM on 10-03-14. Safety maintained on Q 15 minutes checks as ordered without behavioral issues or self injurious activities to note at this time.  R: Pt compliant with current treatment regimen. Denies adverse drug reactions when assessed. Pt denies headaches and blurred vision when assessed for elevated BP and HR. Pt informed about update on new medication (Lisionopril) and is in agreement. Remains safe on and off unit. Plan of Care continues.

## 2014-10-02 NOTE — [facility] Group Notes (Signed)
[place] LCSW Group Therapy  10/02/2014 1:15 pm  Type of Therapy: Process Group Therapy  Participation Level:  Active  Participation Quality:  Appropriate  Affect:  Flat  Cognitive:  Oriented  Insight:  Improving  Engagement in Group:  Limited  Engagement in Therapy:  Limited  Modes of Intervention:  Activity, Clarification, Education, Problem-solving and Support  Summary of Progress/Problems: Today's group addressed the issue of overcoming obstacles.  Patients were asked to identify their biggest obstacle post d/c that stands in the way of their on-going success, and then problem solve as to how to manage this. Swara stayed for the entire time.  Did not contribute spontaneously, but responded when called upon.  Talked about the importance of her dog to help relieve stress, and how she treats herself for doing well by getting a special drink at [facility].  Not really able to identify any obstacles.  "I have everything I could possibly need.  I just have these new neighbors that need to keep their noses out of my business."  Trish Mage 10/02/2014   12:53 PM

## 2014-10-03 ENCOUNTER — Encounter (HOSPITAL_COMMUNITY): Payer: Self-pay | Admitting: Registered Nurse

## 2014-10-03 MED ORDER — CARBAMAZEPINE 200 MG PO TABS: 200.0000 mg | ORAL_TABLET | Freq: Two times a day (BID) | ORAL | Status: DC

## 2014-10-03 MED ORDER — FLUOXETINE HCL 20 MG PO CAPS: 60.0000 mg | ORAL_CAPSULE | Freq: Every day | ORAL | Status: DC

## 2014-10-03 MED ORDER — HYDROXYZINE HCL 50 MG PO TABS: 50.0000 mg | ORAL_TABLET | Freq: Three times a day (TID) | ORAL | Status: DC | PRN

## 2014-10-03 MED ORDER — CALCIUM CARBONATE ANTACID 500 MG PO CHEW: 2.0000 | CHEWABLE_TABLET | Freq: Every day | ORAL | Status: DC | PRN

## 2014-10-03 MED ORDER — TRAZODONE HCL 50 MG PO TABS: 50.0000 mg | ORAL_TABLET | Freq: Every evening | ORAL | Status: DC | PRN

## 2014-10-03 MED ORDER — LISINOPRIL 10 MG PO TABS: 10.0000 mg | ORAL_TABLET | Freq: Every day | ORAL | Status: DC

## 2014-10-03 NOTE — Progress Notes (Addendum)
Patient ID: Kim [name], female   DOB: 1960-08-13, 54 y.o.   MRN: [id number] Pt discharged from [facility].  RN went over AVS with pt.  Pt expressed understanding.  Pt stated pt did not have any questions concerning discharge.  Possessions returned to pt and belonging form signed.  Pt escorted to lobby.  Sample meds and scripts given to pt.

## 2014-10-03 NOTE — Discharge Summary (Signed)
Physician Discharge Summary Note  Patient:  Kim [name] is an 54 y.o., female MRN:  [id number] DOB:  June 21, 1960 Patient phone:  [phone] (home)  Patient address:   [address]  [place] [address],  Total Time spent with patient: 45 minutes  Date of Admission:  09/30/2014 Date of Discharge: 10/03/2014  Reason for Admission:  Per H&P Note:  Kim [name] is a 54 year old female with a history of ADD, Bipolar Disorder who was brought to the [facility] by [employer] under IVC papers. She is reported to have been terrorizing her neighbors with threatening messages and provokes them on a daily basis. The [employer] have reported that they have dealt with patient on a daily basis and that she has been charged criminally 102 times since 2009-06-07. Kim [name] appears very manic during assessment today with rapid and pressured speech. She appears to have no insight into how her behaviors are perceived by others. Patient states "The neighbors can't accept my artistic abilities. I am an eccentric person and they can't understand that. They accuse me of using drugs like heroine and cocaine. I took some people in my house who I met at [employer] and tried to help them but then they left dirty needles all over my house. I was yelling because I needed someone to call 911. I have pretty sculptures in my yard. The neighbors do not like that either. I just need to express myself and I do it in my own yard. They are the ones that need to be here. Not me! 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 Her power of attorney was called while patient was at Kim [name] for collateral information. He reported that the patient inherited 1.5 million after her mother's death in 06/07/2008 but has depleted the money on drugs. The patient is insistent that all the problems are related to her neighbors. This appears to be  delusional according to the collateral information. The patient describes a bizarre sculpture in her yard that consists of "decorations with dog collars and a bicycle."   Principal Problem: Bipolar affective disorder, current episode manic with psychotic symptoms Discharge Diagnoses: Patient Active Problem List   Diagnosis Date Noted  . Bipolar affective disorder, current episode manic with psychotic symptoms [F31.2] 09/30/2014  . Bipolar I disorder, most recent episode (or current) manic, moderate [F31.12] 11/09/2011  . Cocaine abuse [F14.10] 11/09/2011    Musculoskeletal: Strength & Muscle Tone: within normal limits Gait & Station: normal Patient leans: N/A  Psychiatric Specialty Exam:  See Suicide Risk Assessment Physical Exam  Nursing note and vitals reviewed. Constitutional: She is oriented to person, place, and time.  Neck: Normal range of motion.  Respiratory: Effort normal.  Musculoskeletal: Normal range of motion.  Neurological: She is alert and oriented to person, place, and time.    Review of Systems  Cardiovascular:       HTN  Gastrointestinal: Heartburn: History.  Psychiatric/Behavioral: Negative for hallucinations. Depression: Stable. Nervous/anxious: Stable. Insomnia: Stable.   All other systems reviewed and are negative.   Blood pressure 115/96, pulse 105, temperature 98.4 F (36.9 C), temperature source Oral, resp. rate 12, height '5\' 2"'  (1.575 m), weight 62.143 kg (137 lb).Body mass index is 25.05 kg/(m^2).  Have you used any form of tobacco in the last 30 days? (Cigarettes, Smokeless Tobacco, Cigars, and/or Pipes): No  Has this patient used any  form of tobacco in the last 30 days? (Cigarettes, Smokeless Tobacco, Cigars, and/or Pipes) No  Past Medical History:  Past Medical History  Diagnosis Date  . Headache(784.0)   . ADHD (attention deficit hyperactivity disorder)   . 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 Knee surgery      right   Family History: History reviewed. No pertinent family history. Social History:  History  Alcohol Use No    Comment: "every 6 mos"     History  Drug Use  . Yes  . Special: Cocaine    History   Social History  . Marital Status: Single    Spouse Name: N/A  . Number of Children: N/A  . Years of Education: N/A   Social History Main Topics  . Smoking status: Never Smoker   . Smokeless tobacco: Not on file  . Alcohol Use: No     Comment: "every 6 mos"  . Drug Use: Yes    Special: Cocaine  . Sexual Activity: No   Other Topics Concern  . None   Social History Narrative  Risk to Self: Is patient at risk for suicide?: No What has been your use of drugs/alcohol within the last 12 months?: Reports occasional cocaine use Risk to Others:   Prior Inpatient Therapy:   Prior Outpatient Therapy:    Level of Care:  OP  [name] Course:  Kim [name] was admitted for Bipolar affective disorder, current episode manic with psychotic symptoms and crisis management.  He was treated discharged with the medications listed below under Medication List.  Medical problems were identified and treated as needed.  Home medications were restarted as appropriate.  Improvement was monitored by observation and Kim [name] daily report of symptom reduction.  Emotional and mental status was monitored by daily self-inventory reports completed by Kim [name] and clinical staff.         Kim [name] was evaluated by the treatment team for stability and plans for continued recovery upon discharge.  Kim [name] motivation was an integral factor for scheduling further treatment.  Employment, transportation, bed availability, health status, family support, and any pending legal issues were also considered during his [name] stay.  He was offered further treatment options upon discharge including but not limited to Residential, Intensive Outpatient, and  Outpatient treatment.  Kim [name] will follow up with the services as listed below under Follow Up Information.     Upon completion of this admission the patient was both mentally and medically stable for discharge denying suicidal/homicidal ideation, auditory/visual/tactile hallucinations, delusional thoughts and paranoia.       Consults:  psychiatry  Significant Diagnostic Studies:  labs: Carbamazepine level, UDS, CMET, ETOH, CBC  Discharge Vitals:   Blood pressure 115/96, pulse 105, temperature 98.4 F (36.9 C), temperature source Oral, resp. rate 12, height '5\' 2"'  (1.575 m), weight 62.143 kg (137 lb). Body mass index is 25.05 kg/(m^2). Lab Results:   Results for orders placed or performed during the [name] encounter of 09/30/14 (from the past 72 hour(s))  Carbamazepine level, total     Status: None   Collection Time: 10/01/14  6:20 AM  Result Value Ref Range   Carbamazepine Lvl 10.0 4.0 - 12.0 ug/mL    Comment: Performed at [name]    Physical Findings: AIMS: Facial and Oral Movements Muscles of Facial Expression: None, normal Lips and Perioral Area: None, normal Jaw:  None, normal Tongue: None, normal,Extremity Movements Upper (arms, wrists, hands, fingers): None, normal Lower (legs, knees, ankles, toes): None, normal, Trunk Movements Neck, shoulders, hips: None, normal, Overall Severity Severity of abnormal movements (highest score from questions above): None, normal Incapacitation due to abnormal movements: None, normal Patient's awareness of abnormal movements (rate only patient's report): No Awareness, Dental Status Current problems with teeth and/or dentures?: No Does patient usually wear dentures?: No  CIWA:    COWS:      See Psychiatric Specialty Exam and Suicide Risk Assessment completed by Attending Physician prior to discharge.  Discharge destination:  Home  Is patient on multiple antipsychotic therapies at discharge:  No   Has Patient had  three or more failed trials of antipsychotic monotherapy by history:  No    Recommended Plan for Multiple Antipsychotic Therapies: NA      Discharge Instructions    Diet - low sodium heart healthy    Complete by:  As directed      Increase activity slowly    Complete by:  As directed             Medication List    STOP taking these medications        carbamazepine 200 MG 12 hr tablet  Commonly known as:  TEGRETOL XR  Replaced by:  carbamazepine 200 MG tablet     doxepin 25 MG capsule  Commonly known as:  SINEQUAN     EXCEDRIN MIGRAINE PO      TAKE these medications      Indication   calcium carbonate 500 MG chewable tablet  Commonly known as:  TUMS - dosed in mg elemental calcium  Chew 2-3 tablets (400-600 mg of elemental calcium total) by mouth daily as needed for heartburn.   Indication:  Heartburn     carbamazepine 200 MG tablet  Commonly known as:  TEGRETOL  Take 1 tablet (200 mg total) by mouth 2 (two) times daily after a meal.   Indication:  Manic-Depression     FLUoxetine 20 MG capsule  Commonly known as:  PROZAC  Take 3 capsules (60 mg total) by mouth daily.   Indication:  Depressive Phase of Manic-Depression, Major Depressive Disorder     hydrOXYzine 50 MG tablet  Commonly known as:  ATARAX/VISTARIL  Take 1 tablet (50 mg total) by mouth 3 (three) times daily as needed for anxiety.   Indication:  Anxiety     ibuprofen 200 MG tablet  Commonly known as:  ADVIL,MOTRIN  Take 400 mg by mouth every 6 (six) hours as needed for moderate pain.      lisinopril 10 MG tablet  Commonly known as:  PRINIVIL,ZESTRIL  Take 1 tablet (10 mg total) by mouth daily.   Indication:  High Blood Pressure     traZODone 50 MG tablet  Commonly known as:  DESYREL  Take 1 tablet (50 mg total) by mouth at bedtime as needed for sleep.   Indication:  Trouble Sleeping     ZANTAC PO  Take 1 tablet by mouth daily as needed (heartburn).        Follow-up Information     Follow up with [facility] [place].   Why:  Go to the walk-in clinic for your [name] follow up appointment.  They are available to you M-F between 9 and 11 and 1 and 3   Contact information:   [address], [place] [address] Phone: [phone] Fax: [phone]  Follow-up recommendations:  Activity:  As tolerated Diet:  Low sodium  Comments:   Patient has been instructed to take medications as prescribed; and report adverse effects to outpatient provider.  Follow up with primary doctor for any medical issues and If symptoms recur report to nearest emergency or crisis hot line.    Total Discharge Time: 45 minutes  Signed: Earleen Newport, FNP-BC 10/03/2014, 4:40 PM  I personally assessed the patient and formulated the plan Geralyn Flash A. [name] Heck, M.D.

## 2014-10-03 NOTE — Progress Notes (Signed)
  [facility] Adult Case Management Discharge Plan :  Will you be returning to the same living situation after discharge:  Yes,  home At discharge, do you have transportation home?: Yes,  bus pass Do you have the ability to pay for your medications: Yes,  mental health  Release of information consent forms completed and in the chart;  Patient's signature needed at discharge.  Patient to Follow up at: Follow-up Information    Follow up with [facility] [place].   Why:  Go to the walk-in clinic for your hospital follow up appointment.  They are available to you M-F between 9 and 11 and 1 and 3   Contact information:   [address], [place] [address] Phone: [phone] Fax: [phone]      Patient denies SI/HI: Yes,  yes    Safety Planning and Suicide Prevention discussed: Yes,  yes  Have you used any form of tobacco in the last 30 days? (Cigarettes, Smokeless Tobacco, Cigars, and/or Pipes): No  Has patient been referred to the Quitline?: N/A patient is not a smoker  [place], Barbaraann Rondo B 10/03/2014, 1:48 PM

## 2014-10-03 NOTE — [facility]HH Suicide Risk Assessment (Signed)
[facility] Discharge Suicide Risk Assessment   Demographic Factors:  Caucasian  Total Time spent with patient: 30 minutes  Musculoskeletal: Strength & Muscle Tone: within normal limits Gait & Station: normal Patient leans: normal  Psychiatric Specialty Exam: Physical Exam  Review of Systems  Constitutional: Negative.   HENT: Negative.   Eyes: Negative.   Respiratory: Negative.   Cardiovascular: Negative.   Gastrointestinal: Negative.   Genitourinary: Negative.   Musculoskeletal: Negative.   Skin: Negative.   Neurological: Negative.   Endo/Heme/Allergies: Negative.   Psychiatric/Behavioral: Negative.     Blood pressure 115/96, pulse 105, temperature 98.4 F (36.9 C), temperature source Oral, resp. rate 12, height 5\' 2"  (1.575 m), weight 62.143 kg (137 lb).Body mass index is 25.05 kg/(m^2).  General Appearance: Fairly Groomed  Engineer, water::  Fair  Speech:  Clear and [id number]  Volume:  Normal  Mood:  Euthymic  Affect:  Appropriate  Thought Process:  Coherent and Goal Directed  Orientation:  Full (Time, Place, and Person)  Thought Content:  plans as she moves on. Relapse prevention plan  Suicidal Thoughts:  No  Homicidal Thoughts:  No  Memory:  Immediate;   Fair Recent;   Fair Remote;   Fair  Judgement:  Fair  Insight:  Present and Shallow  Psychomotor Activity:  Normal  Concentration:  Fair  Recall:  [employer] of Knowledge:Fair  Language: Fair  Akathisia:  No  Handed:  Right  AIMS (if indicated):     Assets:  Desire for Improvement Housing  Sleep:  Number of Hours: 6.75  Cognition: WNL  ADL's:  Intact   Have you used any form of tobacco in the last 30 days? (Cigarettes, Smokeless Tobacco, Cigars, and/or Pipes): No  Has this patient used any form of tobacco in the last 30 days? (Cigarettes, Smokeless Tobacco, Cigars, and/or Pipes) No  Mental Status Per Nursing Assessment::   On Admission:  NA  Current Mental Status by Physician: In full contact with  reality. There are no active SI plans or intent. She is taking her medications. Her mood is more euthymic.  Her affect is appropriate. She states she is ready to go back home. States she plans to abstain from using any kind of drugs or alcohol. States she has no intention of antagonizing her neighbors but affirms her right to be creative.    Loss Factors: NA  Historical Factors: NA  Risk Reduction Factors:   Positive social support and Positive therapeutic relationship  Continued Clinical Symptoms: Bipolar Disorder   Cognitive Features That Contribute To Risk:  Closed-mindedness, Polarized thinking and Thought constriction (tunnel vision)    Suicide Risk:  Minimal: No identifiable suicidal ideation.  Patients presenting with no risk factors but with morbid ruminations; may be classified as minimal risk based on the severity of the depressive symptoms  Principal Problem: Bipolar affective disorder, current episode manic with psychotic symptoms Discharge Diagnoses:  Patient Active Problem List   Diagnosis Date Noted  . Bipolar affective disorder, current episode manic with psychotic symptoms [F31.2] 09/30/2014  . Bipolar I disorder, most recent episode (or current) manic, moderate [F31.12] 11/09/2011  . Cocaine abuse [F14.10] 11/09/2011    Follow-up Information    Follow up with [facility] [place].   Why:  Go to the walk-in clinic for your hospital follow up appointment.  They are available to you M-F between 9 and 11 and 1 and 3   Contact information:   [address], [place] [address]  Phone: [phone] Fax: [phone]      Plan Of Care/Follow-up recommendations:  Activity:  as tolerated Diet:  regular Follow up [facility] as above Is patient on multiple antipsychotic therapies at discharge:  No   Has Patient had three or more failed trials of antipsychotic monotherapy by history:  No  Recommended Plan for Multiple Antipsychotic  Therapies: NA    Jamie Hafford A 10/03/2014, 10:49 AM

## 2015-07-16 ENCOUNTER — Other Ambulatory Visit: Payer: Self-pay | Admitting: Obstetrics and Gynecology

## 2015-07-16 DIAGNOSIS — Z1231 Encounter for screening mammogram for malignant neoplasm of breast: Secondary | ICD-10-CM

## 2015-08-01 ENCOUNTER — Encounter (HOSPITAL_COMMUNITY): Payer: Self-pay

## 2015-08-01 ENCOUNTER — Ambulatory Visit (HOSPITAL_COMMUNITY)
Admission: RE | Admit: 2015-08-01 | Discharge: 2015-08-01 | Disposition: A | Discharge status: Home / Self Care | Payer: Self-pay | Source: Ambulatory Visit | Attending: Obstetrics and Gynecology | Admitting: Obstetrics and Gynecology

## 2015-08-01 ENCOUNTER — Ambulatory Visit
Admission: RE | Admit: 2015-08-01 | Discharge: 2015-08-01 | Disposition: A | Discharge status: Home / Self Care | Payer: No Typology Code available for payment source | Source: Ambulatory Visit | Attending: Obstetrics and Gynecology | Admitting: Obstetrics and Gynecology

## 2015-08-01 VITALS — BP 114/68 | Temp 97.9°F | Ht 62.0 in | Wt 142.2 lb

## 2015-08-01 DIAGNOSIS — Z1231 Encounter for screening mammogram for malignant neoplasm of breast: Secondary | ICD-10-CM

## 2015-08-01 DIAGNOSIS — Z1239 Encounter for other screening for malignant neoplasm of breast: Secondary | ICD-10-CM

## 2015-08-01 NOTE — Progress Notes (Signed)
No complaints today.   Pap Smear:  Pap smear not completed today. Last Pap smear was in March 2017 at [facility] and normal per patient. Per patient has no history of an abnormal Pap smear.Last Pap smear result is not in EPIC. Previous Pap smear result from 06/28/2008 is in EPIC.  Physical exam: Breasts Breasts symmetrical. No skin abnormalities bilateral breasts. No nipple retraction bilateral breasts. No nipple discharge bilateral breasts. No lymphadenopathy. No lumps palpated bilateral breasts. No complaints of pain or tenderness on exam. Referred patient to the [facility] for a screening mammogram. Appointment scheduled for Thursday, Aug 01, 2015 at 1510.  Pelvic/Bimanual No Pap smear completed today since last Pap smear was in March 2017. Pap smear not indicated per BCCCP guidelines.   Smoking History: Patient has never smoked.  Patient Navigation: Patient education provided. Access to services provided for patient through [facility] program.   Colorectal Cancer Screening: Patient had a colonoscopy completed in 2010. No complaints today.

## 2015-08-01 NOTE — Patient Instructions (Signed)
Educational materials on self breast awareness given. Explained to [name] that she did not need a Pap smear today due to last Pap smear was in March 2017 per patient. Let her know BCCCP will cover Pap smears every 3 years unless has a history of abnormal Pap smears. Referred patient to the [facility] for a screening mammogram. Appointment scheduled for Thursday, Aug 01, 2015 at 1510. Kim [name] verbalized understanding.  Claudell Rhody, Arvil Chaco, RN 3:21 PM

## 2015-08-16 ENCOUNTER — Encounter (HOSPITAL_COMMUNITY): Payer: Self-pay | Admitting: *Deleted

## 2016-02-28 ENCOUNTER — Encounter (HOSPITAL_COMMUNITY): Payer: Self-pay

## 2016-02-28 ENCOUNTER — Emergency Department (HOSPITAL_COMMUNITY)
Admission: EM | Admit: 2016-02-28 | Discharge: 2016-02-28 | Disposition: A | Discharge status: Home / Self Care | Payer: Self-pay | Attending: Emergency Medicine | Admitting: Emergency Medicine

## 2016-02-28 ENCOUNTER — Emergency Department (HOSPITAL_COMMUNITY): Payer: Self-pay

## 2016-02-28 DIAGNOSIS — F909 Attention-deficit hyperactivity disorder, unspecified type: Secondary | ICD-10-CM | POA: Insufficient documentation

## 2016-02-28 DIAGNOSIS — D4989 Neoplasm of unspecified behavior of other specified sites: Secondary | ICD-10-CM

## 2016-02-28 DIAGNOSIS — R1900 Intra-abdominal and pelvic swelling, mass and lump, unspecified site: Secondary | ICD-10-CM | POA: Insufficient documentation

## 2016-02-28 LAB — COMPREHENSIVE METABOLIC PANEL
ALBUMIN: 3.8 g/dL (ref 3.5–5.0)
ALT: 15 U/L (ref 14–54)
AST: 22 U/L (ref 15–41)
Alkaline Phosphatase: 92 U/L (ref 38–126)
Anion gap: 11 (ref 5–15)
BUN: 7 mg/dL (ref 6–20)
CHLORIDE: 101 mmol/L (ref 101–111)
CO2: 22 mmol/L (ref 22–32)
CREATININE: 0.88 mg/dL (ref 0.44–1.00)
Calcium: 9 mg/dL (ref 8.9–10.3)
GFR calc Af Amer: >60 mL/min (ref >60)
GFR calc non Af Amer: >60 mL/min (ref >60)
Glucose, Bld: 105 mg/dL — ABNORMAL HIGH (ref 65–99)
Potassium: 4.2 mmol/L (ref 3.5–5.1)
SODIUM: 134 mmol/L — AB (ref 135–145)
Total Bilirubin: 0.6 mg/dL (ref 0.3–1.2)
Total Protein: 7.3 g/dL (ref 6.5–8.1)

## 2016-02-28 LAB — CBC
HCT: 38 % (ref 36.0–46.0)
Hemoglobin: 12.5 g/dL (ref 12.0–15.0)
MCH: 30 pg (ref 26.0–34.0)
MCHC: 32.9 g/dL (ref 30.0–36.0)
MCV: 91.3 fL (ref 78.0–100.0)
PLATELETS: 418 10*3/uL — AB (ref 150–400)
RBC: 4.16 MIL/uL (ref 3.87–5.11)
RDW: 12.8 % (ref 11.5–15.5)
WBC: 7.7 10*3/uL (ref 4.0–10.5)

## 2016-02-28 LAB — URINALYSIS, ROUTINE W REFLEX MICROSCOPIC
Bilirubin Urine: NEGATIVE
GLUCOSE, UA: NEGATIVE mg/dL
HGB URINE DIPSTICK: NEGATIVE
KETONES UR: 20 mg/dL — AB
Leukocytes, UA: NEGATIVE
Nitrite: NEGATIVE
PH: 5 (ref 5.0–8.0)
PROTEIN: NEGATIVE mg/dL
Specific Gravity, Urine: 1.026 (ref 1.005–1.030)

## 2016-02-28 LAB — I-STAT BETA HCG BLOOD, ED (MC, WL, AP ONLY): I-stat hCG, quantitative: <5 m[IU]/mL (ref <5)

## 2016-02-28 LAB — LIPASE, BLOOD: Lipase: 36 U/L (ref 11–51)

## 2016-02-28 MED ORDER — OXYCODONE-ACETAMINOPHEN 5-325 MG PO TABS: 1.0000 | ORAL_TABLET | ORAL | 0 refills | Status: DC | PRN

## 2016-02-28 MED ORDER — SODIUM CHLORIDE 0.9 % IV BOLUS (SEPSIS)
1000.0000 mL | Freq: Once | INTRAVENOUS | Status: AC
  Administered 2016-02-28: 1000 mL via INTRAVENOUS

## 2016-02-28 MED ORDER — SENNOSIDES-DOCUSATE SODIUM 8.6-50 MG PO TABS: 2.0000 | ORAL_TABLET | Freq: Every day | ORAL | 0 refills | Status: DC

## 2016-02-28 MED ORDER — ONDANSETRON HCL 4 MG/2ML IJ SOLN
4.0000 mg | Freq: Once | INTRAMUSCULAR | Status: AC
  Administered 2016-02-28: 4 mg via INTRAVENOUS
  Filled 2016-02-28: qty 2

## 2016-02-28 MED ORDER — IOPAMIDOL (ISOVUE-300) INJECTION 61%
INTRAVENOUS | Status: AC
  Administered 2016-02-28: 100 mL
  Filled 2016-02-28: qty 100

## 2016-02-28 MED ORDER — MORPHINE SULFATE (PF) 4 MG/ML IV SOLN
4.0000 mg | Freq: Once | INTRAVENOUS | Status: AC
Start: 2016-02-28 — End: 2016-02-28
  Administered 2016-02-28: 4 mg via INTRAVENOUS
  Filled 2016-02-28: qty 1

## 2016-02-28 MED ORDER — ONDANSETRON 4 MG PO TBDP: ORAL_TABLET | ORAL | 0 refills | Status: DC

## 2016-02-28 NOTE — ED Provider Notes (Signed)
[facility] DEPT  Provider Note   CSN: [id number] Arrival date & time: 02/28/16  1307   History   Chief Complaint Chief Complaint  Patient presents with  . Abdominal Pain    HPI Kim [name] is a 55 y.o. female.  Patient is history of Bipolar Disorder presenting with one month history of abdominal distention and right sided abdominal pain. Patient states she has chronic issues with constipation and contributed her current symptoms to it, however has treated her constipation with magnesium citrate at home with minimal relief of symptoms. Last BM was 2 days ago; she denies melena or BRBPR. She has a good appetite but is unable to eat much due to discomfort with her distention and bloating and has associated nausea but no vomiting. She has consistent right sided abdominal pain that she cannot qualify, with sharp, stabbing pain with movement. She states that she feels some relief when lying on her left side. She denies fevers or chills, weakness, headaches, vision or hearing changes, sweats, rashes, skin changes, LE swelling, chest pain, shortness of breath, hematuria, dysuria or changes in urinary habits.   She had uterine ablation and denies vaginal bleeding or spotting. She denies prior personal or family history of abdominal disease. She denies prior cholecystectomy or appendectomy; only prior abdominal surgery was a tummy tuck ~49yrs ago.   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 She was seen by PCP a week ago who did blood work including HIV and Hep C; per patient these were normal and nonreactive (no records in care everywhere). She had a KUB which showed mild general hyperdensity over central abdomen which could be sequale of free fluid.      Past Medical History:  Diagnosis Date  . ADHD (attention deficit hyperactivity disorder)   . Headache(784.0)   . 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 Bipolar affective disorder, current episode manic with psychotic symptoms ([place]) 09/30/2014  . Bipolar I disorder, most recent episode (or current) manic, moderate ([place]) 11/09/2011  . Cocaine abuse 11/09/2011    Past Surgical History:  Procedure Laterality Date  . KNEE SURGERY     right    OB History    Gravida Para Term Preterm AB Living   0 0 0 0 0 0   SAB TAB Ectopic Multiple Live Births   0 0 0 0         Home Medications    Prior to Admission medications   Medication Sig Start Date End Date Taking? Authorizing Provider  buPROPion (WELLBUTRIN SR) 150 MG 12 hr tablet Take 150 mg by mouth daily.    Historical Provider, MD  calcium carbonate (TUMS - DOSED IN MG ELEMENTAL CALCIUM) 500 MG chewable tablet Chew 2-3 tablets (400-600 mg of elemental calcium total) by mouth daily as needed for heartburn. Patient not taking: Reported on 08/01/2015 10/03/14   Shuvon B Rankin, NP  carbamazepine (TEGRETOL) 200 MG tablet Take 1 tablet (200 mg total) by mouth 2 (two) times daily after a meal. Patient not taking: Reported on 08/01/2015 10/03/14   Shuvon B Rankin, NP  FLUoxetine (PROZAC) 20 MG capsule Take 3 capsules (60 mg total) by mouth daily. Patient not taking: Reported on 08/01/2015 10/03/14   Shuvon B Rankin, NP  hydrOXYzine (ATARAX/VISTARIL) 50 MG tablet Take 1 tablet (50 mg total) by mouth 3 (three) times daily as needed for anxiety.  Patient not taking: Reported on 08/01/2015 10/03/14   Shuvon B Rankin, NP  ibuprofen (ADVIL,MOTRIN) 200 MG tablet Take 400 mg by mouth every 6 (six) hours as needed for moderate pain. Reported on 08/01/2015    Historical Provider, MD  lisinopril (PRINIVIL,ZESTRIL) 10 MG tablet Take 1 tablet (10 mg total) by mouth daily. Patient not taking: Reported on 08/01/2015 10/03/14   Shuvon B Rankin, NP  ondansetron (ZOFRAN ODT) 4 MG disintegrating tablet Take 1 to 2 tabs every 8 hours as needed for nausea. 02/28/16   Alphonzo Grieve, MD  oxyCODONE-acetaminophen (ROXICET)  5-325 MG tablet Take 1 tablet by mouth every 4 (four) hours as needed for severe pain. 02/28/16   Alphonzo Grieve, MD  RaNITidine HCl (ZANTAC PO) Take 1 tablet by mouth daily as needed (heartburn). Reported on 08/01/2015    Historical Provider, MD  senna-docusate (SENOKOT-S) 8.6-50 MG tablet Take 2 tablets by mouth at bedtime. For constipation 02/28/16   Alphonzo Grieve, MD  traZODone (DESYREL) 50 MG tablet Take 1 tablet (50 mg total) by mouth at bedtime as needed for sleep. Patient not taking: Reported on 08/01/2015 10/03/14   Consuello Closs, NP    Family History Family History  Problem Relation Age of Onset  . Cancer Mother     pancreatic    Social History Social History  Substance Use Topics  . Smoking status: Never Smoker  . Smokeless tobacco: Never Used  . Alcohol use No     Comment: "every 6 mos"     Allergies   Penicillins   Review of Systems Review of Systems  Constitutional: Negative for activity change, appetite change, chills, diaphoresis and fever.  HENT: Negative.   Eyes: Negative for visual disturbance.  Respiratory: Negative for cough, chest tightness, shortness of breath and wheezing.        Discomfort with deep breaths due to abdominal distention  Cardiovascular: Negative for chest pain, palpitations and leg swelling.  Gastrointestinal: Positive for abdominal distention, abdominal pain, constipation and nausea. Negative for anal bleeding, blood in stool, diarrhea, rectal pain and vomiting.  Endocrine: Negative for polyuria.  Genitourinary: Negative for decreased urine volume, difficulty urinating, dysuria, flank pain, frequency, hematuria, pelvic pain and urgency.  Musculoskeletal: Negative for arthralgias and myalgias.  Skin: Negative for rash and wound.  Neurological: Negative for dizziness, weakness, light-headedness, numbness and headaches.  Hematological: Negative for adenopathy.  Psychiatric/Behavioral: Negative for confusion, decreased concentration and  dysphoric mood. The patient is not nervous/anxious and is not hyperactive.      Physical Exam Updated Vital Signs BP 129/78   Pulse 94   Temp 98.7 F (37.1 C) (Oral)   Resp 16   Ht 5' 1.5" (1.562 m)   Wt 63.5 kg   LMP  (LMP Unknown)   SpO2 94%   BMI 26.02 kg/m   Physical Exam  Constitutional: She is oriented to person, place, and time. She appears well-developed and well-nourished. No distress.  HENT:  Head: Normocephalic and atraumatic.  Eyes: Conjunctivae and EOM are normal.  Neck: Normal range of motion. No thyromegaly present.  Cardiovascular: Normal rate and regular rhythm.  Exam reveals no gallop and no friction rub.   No murmur heard. Pulmonary/Chest: Effort normal. No respiratory distress. She has no wheezes. She has no rales. She exhibits no tenderness.  Abdominal:  +BS; epigastrium soft rest of abdomen rigid and distended; hard to assess for hepatosplenomegaly; no appreciable shifting dullness; more tenderness on right abdomen without rebound  Musculoskeletal: Normal range of motion.  She exhibits no edema, tenderness or deformity.  Neurological: She is alert and oriented to person, place, and time. No cranial nerve deficit or sensory deficit.  Skin: Skin is warm and dry. Capillary refill takes less than 2 seconds. No rash noted. She is not diaphoretic. No erythema.  Psychiatric: She has a normal mood and affect. Her behavior is normal. Judgment and thought content normal.     ED Treatments / Results  Labs (all labs ordered are listed, but only abnormal results are displayed) Labs Reviewed  COMPREHENSIVE METABOLIC PANEL - Abnormal; Notable for the following:       Result Value   Sodium 134 (*)    Glucose, Bld 105 (*)    All other components within normal limits  CBC - Abnormal; Notable for the following:    Platelets 418 (*)    All other components within normal limits  URINALYSIS, ROUTINE W REFLEX MICROSCOPIC - Abnormal; Notable for the following:     APPearance HAZY (*)    Ketones, ur 20 (*)    All other components within normal limits  LIPASE, BLOOD  I-STAT BETA HCG BLOOD, ED ([facility], [facility], [facility] ONLY)    EKG  EKG Interpretation None       Radiology Ct Abdomen Pelvis W Contrast  Result Date: 02/28/2016 CLINICAL DATA:  Right lower quadrant pain for 1 month, unbearable for 2 days. Distended abdomen with constipation. EXAM: CT ABDOMEN AND PELVIS WITH CONTRAST TECHNIQUE: Multidetector CT imaging of the abdomen and pelvis was performed using the standard protocol following bolus administration of intravenous contrast. CONTRAST:  145mL ISOVUE-300 IOPAMIDOL (ISOVUE-300) INJECTION 61% COMPARISON:  None. FINDINGS: Lower chest: Mild atelectasis at both lung bases. There is no significant pleural or pericardial effusion. There is a moderate size hiatal hernia. Hepatobiliary: The liver is normal in density without focal abnormality. No evidence of gallstones, gallbladder wall thickening or biliary dilatation. Pancreas: Unremarkable. No pancreatic ductal dilatation or surrounding inflammatory changes. Spleen: Normal in size without focal abnormality. Adrenals/Urinary Tract: Both adrenal glands appear normal. The kidneys appear normal without evidence of urinary tract calculus, suspicious lesion or hydronephrosis. No bladder abnormalities are seen. Stomach/Bowel: No evidence of bowel wall thickening, distention or surrounding inflammatory change. There is peripheral displacement of the bowel by a large cystic pelvic mass, further described below. The appendix is not well visualized, although is in questionable contact with the large cystic pelvic mass. Vascular/Lymphatic: There are no enlarged abdominal or pelvic lymph nodes. No acute vascular findings are seen. The IVC is mildly compressed at the level of the pelvic brim by the large pelvic mass. Reproductive: The uterus is posteriorly displaced. No definite normal ovarian tissue is seen. There is a very large  complex cystic pelvic mass, extending superiorly above the umbilicus. This measures up to 26.0 x 25.7 x 13.7 cm. There are solid components along the periphery of this lesion, especially posteriorly. There are also thickened septations. Other: There is a small amount of pelvic ascites. There is some soft tissue stranding in the omentum which could be secondary to vascular congestion, although is worrisome for peritoneal carcinomatosis. Musculoskeletal: No acute or significant osseous findings. Multiple superior endplate Schmorl's nodes are noted (at T12, L2 and L3). IMPRESSION: 1. 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 No normal ovarian tissue identified. 2. The mass contacts the appendix. 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 3. Small amount of ascites with soft tissue stranding in the omentum worrisome  for carcinomatosis. 4. No adenopathy, solid visceral organ metastasis or hydronephrosis. 5. Moderate size hiatal hernia. Electronically Signed   By: Richardean Sale M.D.   On: 02/28/2016 18:40    Procedures Procedures (including critical care time)  Medications Ordered in ED Medications  iopamidol (ISOVUE-300) 61 % injection (100 mLs  Contrast Given 02/28/16 1741)  sodium chloride 0.9 % bolus 1,000 mL (0 mLs Intravenous Stopped 02/28/16 2036)  sodium chloride 0.9 % bolus 1,000 mL (0 mLs Intravenous Stopped 02/28/16 2036)  ondansetron (ZOFRAN) injection 4 mg (4 mg Intravenous Given 02/28/16 1914)  morphine 4 MG/ML injection 4 mg (4 mg Intravenous Given 02/28/16 1914)     Initial Impression / Assessment and Plan / ED Course  I have reviewed the triage vital signs and the nursing notes.  Pertinent labs & imaging results that were available during my care of the patient were reviewed by me and considered in my medical decision making (see chart for details).  Clinical Course    Ms. Comacho presents with one month  history of abdominal distention and right sided abdominal pain. On evaluation, patient was afebrile, normotensive with borderline tachycardia, and was saturating adequately on room air. She appears generally comfortable and not in distress. Exam is positive for significant abdominal distention and rigid abdomen concerning for ascites vs solid mass, as well as mid-lower right abdominal tenderness. Differential includes liver pathology, intestinal perforation, obstruction, and uterine/ovarian pathology. CBC and CMP were unremarkable. 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 Case was discussed with Gynecology who will forward case to Dr. Denman George (gyn onc) who would contact patient early next week for appointment. They agreed that patient could be discharged if pain and symptoms are under control or if patient required inpatient care they would consult and evaluate her while hospitalized. There was no need for emergent intervention that would otherwise warrant hospitalization.   While in the ED, patient's vital signs remained stable; she received IVF, zofran and morphine for symptom control which was adequate.   Findings were discussed with patient who wished to be discharged and would follow up outpatient with Gyn-Onc. Patient was comfortable with discharge with pain and nausea management. We discussed strict return precautions including uncontrolled pain with current regimen, PO intolerance, fever, chest pain, urinary symptoms. Patient stated understanding of the above.   Final Clinical Impressions(s) / ED Diagnoses   Final diagnoses:  Intra-abdominal tumor   New Prescriptions Discharge Medication List as of  02/28/2016  7:34 PM    START taking these medications   Details  ondansetron (ZOFRAN ODT) 4 MG disintegrating tablet Take 1 to 2 tabs every 8 hours as needed for nausea., Print    oxyCODONE-acetaminophen (ROXICET) 5-325 MG tablet Take 1 tablet by mouth every 4 (four) hours as needed for severe pain., Starting Fri 02/28/2016, Print    senna-docusate (SENOKOT-S) 8.6-50 MG tablet Take 2 tablets by mouth at bedtime. For constipation, Starting Fri 02/28/2016, Print         Alphonzo Grieve, MD 03/01/16 1400    Duffy Bruce, MD 03/01/16 [phone]

## 2016-02-28 NOTE — ED Notes (Signed)
Pt transported to CT ?

## 2016-02-28 NOTE — Discharge Instructions (Signed)
You were evaluated today for abdominal bloating and pain. Your CT showed a mass in your abdomen that is causing your symptoms. We talked to Gynecology at [facility]; you will be contacted by the oncologists office on Monday about an appointment. At the time of the appointment, the next steps of the workup with be initiated and discussed as far as further imaging, and treatment options.   For right now, we will control your pain and other symptoms.  If your pain is uncontrolled, you develop fever, are unable to keep down food and fluids please return to the ED for re-evaluation.

## 2016-02-28 NOTE — ED Notes (Signed)
Pt ambulated to restroom.  Attempted to get urine sample again and pt was unable to go.  Will try later.

## 2016-02-28 NOTE — ED Triage Notes (Signed)
Pt. Has rt. Lower quadrant pain. Started 2 weeks ago.  She also is feeling bloated.  The pain was intermittent and now it is constant and more severe.  She denies any n/v/d.  She seen her PCP and had blood work done over 1 week ago and everything was fine.  Skin is warm , pink and dry.  Pt. Is having issues with constipation.   She did take Magnesium Citrate and helped with the constipation.

## 2016-03-04 ENCOUNTER — Ambulatory Visit: Payer: Self-pay | Attending: Gynecologic Oncology | Admitting: Gynecologic Oncology

## 2016-03-04 ENCOUNTER — Encounter: Payer: Self-pay | Admitting: Gynecologic Oncology

## 2016-03-04 ENCOUNTER — Other Ambulatory Visit: Payer: Self-pay | Admitting: Gynecologic Oncology

## 2016-03-04 ENCOUNTER — Other Ambulatory Visit (HOSPITAL_BASED_OUTPATIENT_CLINIC_OR_DEPARTMENT_OTHER): Payer: No Typology Code available for payment source

## 2016-03-04 VITALS — BP 144/84 | HR 111 | Temp 98.4°F | Resp 18 | Ht 61.5 in | Wt 152.6 lb

## 2016-03-04 DIAGNOSIS — Z79899 Other long term (current) drug therapy: Secondary | ICD-10-CM | POA: Insufficient documentation

## 2016-03-04 DIAGNOSIS — R188 Other ascites: Secondary | ICD-10-CM | POA: Insufficient documentation

## 2016-03-04 DIAGNOSIS — Z880 Allergy status to penicillin: Secondary | ICD-10-CM | POA: Insufficient documentation

## 2016-03-04 DIAGNOSIS — Z800 Family history of malignant neoplasm of digestive organs: Secondary | ICD-10-CM | POA: Insufficient documentation

## 2016-03-04 DIAGNOSIS — R1909 Other intra-abdominal and pelvic swelling, mass and lump: Secondary | ICD-10-CM

## 2016-03-04 DIAGNOSIS — R1900 Intra-abdominal and pelvic swelling, mass and lump, unspecified site: Secondary | ICD-10-CM | POA: Insufficient documentation

## 2016-03-04 DIAGNOSIS — F909 Attention-deficit hyperactivity disorder, unspecified type: Secondary | ICD-10-CM | POA: Insufficient documentation

## 2016-03-04 LAB — CEA (IN HOUSE-CHCC): CEA (CHCC-In House): 12.58 ng/mL — ABNORMAL HIGH (ref 0.00–5.00)

## 2016-03-04 MED ORDER — OXYCODONE-ACETAMINOPHEN 5-325 MG PO TABS: 1.0000 | ORAL_TABLET | ORAL | 0 refills | Status: DC | PRN

## 2016-03-04 NOTE — Addendum Note (Signed)
Addended by: Joylene John D on: 03/04/2016 11:38 AM   Modules accepted: Orders

## 2016-03-04 NOTE — Patient Instructions (Signed)
Preparing for your Surgery  Plan for surgery on March 12, 2016 with Dr. Everitt Amber at [place] will be scheduled for an exploratory laparotomy (open incision), total abdominal hysterectomy, bilateral salpingo-oophorectomy, removal of the mass, possible staging if cancer identified.  Pre-operative Testing -You will receive a phone call from presurgical testing at [facility] to arrange for a pre-operative testing appointment before your surgery.  This appointment normally occurs one to two weeks before your scheduled surgery.   -Bring your insurance card, copy of an advanced directive if applicable, medication list  -At that visit, you will be asked to sign a consent for a possible blood transfusion in case a transfusion becomes necessary during surgery.  The need for a blood transfusion is rare but having consent is a necessary part of your care.     -You should not be taking blood thinners or aspirin at least ten days prior to surgery unless instructed by your surgeon.  Day Before Surgery at [facility] will be asked to take in a light diet the day before surgery.  Avoid carbonated beverages.  You will be advised to have nothing to eat or drink after midnight the evening before.     Eat a light diet the day before surgery.  Examples including soups, broths, toast, yogurt, mashed potatoes.  Things to avoid include carbonated beverages (fizzy beverages), raw fruits and raw vegetables, or beans.    If your bowels are filled with gas, your surgeon will have difficulty visualizing your pelvic organs which increases your surgical risks.  Your role in recovery Your role is to become active as soon as directed by your doctor, while still giving yourself time to heal.  Rest when you feel tired. You will be asked to do the following in order to speed your recovery:  - Cough and breathe deeply. This helps toclear and expand your lungs and can prevent pneumonia.  You may be given a spirometer to practice deep breathing. A staff member will show you how to use the spirometer. - Do mild physical activity. Walking or moving your legs help your circulation and body functions return to normal. A staff member will help you when you try to walk and will provide you with simple exercises. Do not try to get up or walk alone the first time. - Actively manage your pain. Managing your pain lets you move in comfort. We will ask you to rate your pain on a scale of zero to 10. It is your responsibility to tell your doctor or nurse where and how much you hurt so your pain can be treated.  Special Considerations -If you are diabetic, you may be placed on insulin after surgery to have closer control over your blood sugars to promote healing and recovery.  This does not mean that you will be discharged on insulin.  If applicable, your oral antidiabetics will be resumed when you are tolerating a solid diet.  -Your final pathology results from surgery should be available by the Friday after surgery and the results will be relayed to you when available.   Blood Transfusion Information WHAT IS A BLOOD TRANSFUSION? A transfusion is the replacement of blood or some of its parts. Blood is made up of multiple cells which provide different functions.  Red blood cells carry oxygen and are used for blood loss replacement.  White blood cells fight against infection.  Platelets control bleeding.  Plasma helps clot blood.  Other blood products  are available for specialized needs, such as hemophilia or other clotting disorders. BEFORE THE TRANSFUSION  Who gives blood for transfusions?   You may be able to donate blood to be used at a later date on yourself (autologous donation).  Relatives can be asked to donate blood. This is generally not any safer than if you have received blood from a stranger. The same precautions are taken to ensure safety when a relative's blood is  donated.  Healthy volunteers who are fully evaluated to make sure their blood is safe. This is blood bank blood. Transfusion therapy is the safest it has ever been in the practice of medicine. Before blood is taken from a donor, a complete history is taken to make sure that person has no history of diseases nor engages in risky social behavior (examples are intravenous drug use or sexual activity with multiple partners). The donor's travel history is screened to minimize risk of transmitting infections, such as malaria. The donated blood is tested for signs of infectious diseases, such as HIV and hepatitis. The blood is then tested to be sure it is compatible with you in order to minimize the chance of a transfusion reaction. If you or a relative donates blood, this is often done in anticipation of surgery and is not appropriate for emergency situations. It takes many days to process the donated blood. RISKS AND COMPLICATIONS Although transfusion therapy is very safe and saves many lives, the main dangers of transfusion include:   Getting an infectious disease.  Developing a transfusion reaction. This is an allergic reaction to something in the blood you were given. Every precaution is taken to prevent this. The decision to have a blood transfusion has been considered carefully by your caregiver before blood is given. Blood is not given unless the benefits outweigh the risks.

## 2016-03-04 NOTE — Progress Notes (Signed)
GYN oncology New Patient Consultation.  Kim [name] 55 y.o. female  CC:  Chief Complaint  Patient presents with  . abdominal mass    HPI: Patient is seen today in consultation at the request of Dr. Duffy Bruce in the emergency room. Primary physician Dr. Elbert Ewings.  Patient is a 54 year old gravida 1 para 0 aborta 1 who is not sure when she was last menopausal. She underwent an endometrial ablation in 2005 and his had no bleeding since that time. She occasionally still has some hot flashes but states that her estrogen levels are "okay".  Approximately 2 months ago she felt that she was starting to gain weight was having increasing constipation and indigestion. She also started noticing abdominal swelling, bloating, increased flatulence and developed over time early satiety. She states she looked online for number of different diagnoses and she Putting this off. She really didn't have much pain with occasional twinges with the exception of one Saturday 2 weeks ago which time she woke up with severe abdominal pain. This past Friday she saw her primary care physician who recommended that she go to the emergency room. In the emergency room she was noted to have a distended abdomen. CT scan was performed that revealed:  FINDINGS: Lower chest: Mild atelectasis at both lung bases. There is no significant pleural or pericardial effusion. There is a moderate size hiatal hernia.  Hepatobiliary: The liver is normal in density without focal abnormality. No evidence of gallstones, gallbladder wall thickening or biliary dilatation.  Pancreas: Unremarkable. No pancreatic ductal dilatation or surrounding inflammatory changes.  Spleen: Normal in size without focal abnormality.  Adrenals/Urinary Tract: Both adrenal glands appear normal. The kidneys appear normal without evidence of urinary tract calculus, suspicious lesion or hydronephrosis. No bladder abnormalities are seen.  Stomach/Bowel:  No evidence of bowel wall thickening, distention or surrounding inflammatory change. There is peripheral displacement of the bowel by a large cystic pelvic mass, further described below. The appendix is not well visualized, although is in questionable contact with the large cystic pelvic mass.  Vascular/Lymphatic: There are no enlarged abdominal or pelvic lymph nodes. No acute vascular findings are seen. The IVC is mildly compressed at the level of the pelvic brim by the large pelvic mass.  Reproductive: The uterus is posteriorly displaced. No definite normal ovarian tissue is seen. There is a very large complex cystic pelvic mass, extending superiorly above the umbilicus. This measures up to 26.0 x 25.7 x 13.7 cm. There are solid components along the periphery of this lesion, especially posteriorly. There are also thickened septations.  Other: There is a small amount of pelvic ascites. There is some soft tissue stranding in the omentum which could be secondary to vascular congestion, although is worrisome for peritoneal carcinomatosis.  IMPRESSION: 1. 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 No normal ovarian tissue identified. 2. The mass contacts the appendix. 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 3. Small amount of ascites with soft tissue stranding in the omentum worrisome for carcinomatosis. 4. No adenopathy, solid visceral organ metastasis or hydronephrosis. 5. Moderate size hiatal hernia.  She has not had any tumor markers drawn and comes in today for follow-up. She is up-to-date on her mammograms having had one in April of this year. Her colonoscopies are up-to-date having had one in 2010, with 10 year follow-up recommended. She had a Pap smear earlier this year that was normal. She does have a prior history of  cocaine use the last time she used was 2 years ago. She's Hep C negative. She's never used any IV  drugs. She does not use any alcohol.  Review of Systems  Constitutional:   Denies fever. Skin: No rash, sores, jaundice, itching, or dryness.  Cardiovascular: No chest pain, shortness of breath, or edema  Pulmonary: No cough or wheeze.  Gastro Intestinal: Reporting intermittent lower abdominal soreness.  Occ nausea, -vomiting, +constipation, - diarrhea reported.  Genitourinary: No frequency, urgency, or dysuria.  Denies vaginal bleeding and discharge.  Musculoskeletal: No joint swelling or pain.  Neurologic: No weakness Psychology: Long history of psychiatric disease, prior history of suicidal ideation but none in 2 years since she stopped illicit drug use.   Current Meds:  Outpatient Encounter Prescriptions as of 03/04/2016  Medication Sig  . atorvastatin (LIPITOR) 20 MG tablet Take 20 mg by mouth at bedtime.  Marland Kitchen buPROPion (WELLBUTRIN XL) 300 MG 24 hr tablet Take 300 mg by mouth every morning.  . desvenlafaxine (PRISTIQ) 50 MG 24 hr tablet Take 50 mg by mouth every morning.  . lamoTRIgine (LAMICTAL) 100 MG tablet Take 100 mg by mouth at bedtime.  Marland Kitchen oxyCODONE-acetaminophen (ROXICET) 5-325 MG tablet Take 1 tablet by mouth every 4 (four) hours as needed for severe pain.  . RaNITidine HCl (ZANTAC PO) Take 1 tablet by mouth daily as needed (heartburn). Reported on 08/01/2015  . senna-docusate (SENOKOT-S) 8.6-50 MG tablet Take 2 tablets by mouth at bedtime. For constipation  . buPROPion (WELLBUTRIN SR) 150 MG 12 hr tablet Take 150 mg by mouth daily.  . calcium carbonate (TUMS - DOSED IN MG ELEMENTAL CALCIUM) 500 MG chewable tablet Chew 2-3 tablets (400-600 mg of elemental calcium total) by mouth daily as needed for heartburn. (Patient not taking: Reported on 03/04/2016)  . carbamazepine (TEGRETOL) 200 MG tablet Take 1 tablet (200 mg total) by mouth 2 (two) times daily after a meal. (Patient not taking: Reported on 03/04/2016)  . FLUoxetine (PROZAC) 20 MG capsule Take 3 capsules (60 mg total) by  mouth daily. (Patient not taking: Reported on 03/04/2016)  . hydrOXYzine (ATARAX/VISTARIL) 50 MG tablet Take 1 tablet (50 mg total) by mouth 3 (three) times daily as needed for anxiety. (Patient not taking: Reported on 03/04/2016)  . ibuprofen (ADVIL,MOTRIN) 200 MG tablet Take 400 mg by mouth every 6 (six) hours as needed for moderate pain. Reported on 08/01/2015  . lamoTRIgine (LAMICTAL) 200 MG tablet Take 200 mg by mouth at bedtime.  Marland Kitchen lisinopril (PRINIVIL,ZESTRIL) 10 MG tablet Take 1 tablet (10 mg total) by mouth daily. (Patient not taking: Reported on 03/04/2016)  . ondansetron (ZOFRAN ODT) 4 MG disintegrating tablet Take 1 to 2 tabs every 8 hours as needed for nausea. (Patient not taking: Reported on 03/04/2016)  . traZODone (DESYREL) 50 MG tablet Take 1 tablet (50 mg total) by mouth at bedtime as needed for sleep. (Patient not taking: Reported on 03/04/2016)   No facility-administered encounter medications on file as of 03/04/2016.     Allergy:  Allergies  Allergen Reactions  . Penicillins     Unknown reaction.    Social Hx:   Social History   Social History  . Marital status: Single    Spouse name: N/A  . Number of children: N/A  . Years of education: N/A   Occupational History  . Not on file.   Social History Main Topics  . Smoking status: Never Smoker  . Smokeless tobacco: Never Used  . Alcohol use No  Comment: "every 6 mos"  . Drug use: No  . Sexual activity: No   Other Topics Concern  . Not on file   Social History Narrative  . No narrative on file    Past Surgical Hx:  Past Surgical History:  Procedure Laterality Date  . KNEE SURGERY     right    Past Medical Hx:  Past Medical History:  Diagnosis Date  . ADHD (attention deficit hyperactivity disorder)   . Headache(784.0)   . 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    Family Hx:  Family History  Problem Relation Age of Onset  . Cancer Mother      pancreatic    Vitals:  Blood pressure (!) 144/84, pulse (!) 111, temperature 98.4 F (36.9 C), resp. rate 18, height 5' 1.5" (1.562 m), weight 152 lb 9.6 oz (69.2 kg), SpO2 98 %.  Physical Exam:  Well-nourished well-developed female in no acute distress.  Neck: Supple, no lymphadenopathy, no thyromegaly.  Lungs: Clear to auscultation bilaterally.  Cardiac: Regular rate and rhythm.  Abdomen: Well-healed abdominal plasty incision across the lower abdomen. Large abdominal pelvic mass measuring approximately 25 cm. There is no appreciable fluid wave but exam is somewhat limited secondary to the large mass. There is no paraspinal megaly. There is a small reducible umbilical hernia. There is no rebound or guarding.  Groins: No lymphadenopathy.  Extremities: No edema.  Pelvic: External genitalia within normal limits. Vagina is markedly atrophic. The cervix is visualized. It is nulliparous. Bimanual examination the cervix is palpably normal. I cannot separate the mass from the uterus. The masses out of the pelvis. It is mobile and abdominal exam but I cannot appreciated on pelvic exam. Rectovaginal exam confirms no nodularity.  Assessment/Plan: 55 year old with large abdominal pelvic mass measuring 26 x 25.7 x 13.7 cm with both cystic and solid components. There also thickened septations. Of note there is a statement that the appendix is not well visualized though it is a questionable contact with a large cystic pelvic mass. There is no obvious lymphadenopathy. There is a small amount of ascites and some soft tissue stranding in the omentum which could be secondary to vascular congestion although also worrisome for peritoneal carcinomatosis.  I met with the patient and her support system today. Tumor markers have not been drawn and we will check a CA-125 and a CEA.  She is currently scheduled for surgery on December 28 with Dr. Denman George. She will undergo exploratory laparotomy via a vertical  midline incision. We discussed that we would remove the large ovarian mass and he would be sent for frozen section. While we're awaiting frozen section we would perform a hysterectomy and remove the contralateral ovary. They know that additional surgery will be based on the results of the frozen section and it could include completion of the procedure for staging. We discussed that staging would involve omentectomy and pelvic lymph node assessment as well as removal of any abnormal tissues. We discussed that may require resection of the appendix or other areas of the bowel.  Risks of surgery including but not limited to bleeding, infection, injury to surrounding organs were discussed with the patient. She understands that she will be in the hospital anywhere from 3-5 days.  She does not have any living family members. She will attempt to appoint one of her friends as her power of attorney for healthcare needs prior to next Thursday.  Her questions as well as those of her support system with her elicited in answer to their satisfaction. They have our card and knows that we will call them with the results of the CA-125 and CEA tomorrow and make they can call us should they have any questions before next week.  We appreciate the opportunity to partner in the care of this very pleasant patient.  Kamdon Reisig A., MD 03/04/2016, 10:37 AM

## 2016-03-05 ENCOUNTER — Encounter: Payer: Self-pay | Admitting: *Deleted

## 2016-03-05 LAB — CA 125: Cancer Antigen (CA) 125: 29.8 U/mL (ref 0.0–38.1)

## 2016-03-05 NOTE — Progress Notes (Signed)
Pt is being scheduled for preop appt; please place surgical orders in epic. Thanks.  

## 2016-03-06 ENCOUNTER — Telehealth: Payer: Self-pay | Admitting: Gynecologic Oncology

## 2016-03-06 ENCOUNTER — Encounter (HOSPITAL_COMMUNITY): Payer: Self-pay

## 2016-03-06 ENCOUNTER — Encounter (HOSPITAL_COMMUNITY)
Admission: RE | Admit: 2016-03-06 | Discharge: 2016-03-06 | Disposition: A | Discharge status: Home / Self Care | Payer: Self-pay | Source: Ambulatory Visit | Attending: Gynecologic Oncology | Admitting: Gynecologic Oncology

## 2016-03-06 DIAGNOSIS — Z01812 Encounter for preprocedural laboratory examination: Secondary | ICD-10-CM | POA: Insufficient documentation

## 2016-03-06 DIAGNOSIS — Z0183 Encounter for blood typing: Secondary | ICD-10-CM | POA: Insufficient documentation

## 2016-03-06 DIAGNOSIS — R1900 Intra-abdominal and pelvic swelling, mass and lump, unspecified site: Secondary | ICD-10-CM | POA: Insufficient documentation

## 2016-03-06 HISTORY — DX: Depression, unspecified: F32.A

## 2016-03-06 HISTORY — DX: Gastro-esophageal reflux disease without esophagitis: K21.9

## 2016-03-06 HISTORY — DX: Bipolar disorder, unspecified: F31.9

## 2016-03-06 HISTORY — DX: Major depressive disorder, single episode, unspecified: F32.9

## 2016-03-06 LAB — CBC WITH DIFFERENTIAL/PLATELET
BASOS PCT: 1 %
Basophils Absolute: 0 10*3/uL (ref 0.0–0.1)
Eosinophils Absolute: 0.2 10*3/uL (ref 0.0–0.7)
Eosinophils Relative: 3 %
HEMATOCRIT: 37.1 % (ref 36.0–46.0)
HEMOGLOBIN: 11.9 g/dL — AB (ref 12.0–15.0)
LYMPHS ABS: 2.5 10*3/uL (ref 0.7–4.0)
Lymphocytes Relative: 33 %
MCH: 29 pg (ref 26.0–34.0)
MCHC: 32.1 g/dL (ref 30.0–36.0)
MCV: 90.5 fL (ref 78.0–100.0)
MONOS PCT: 11 %
Monocytes Absolute: 0.8 10*3/uL (ref 0.1–1.0)
NEUTROS ABS: 4.1 10*3/uL (ref 1.7–7.7)
NEUTROS PCT: 52 %
Platelets: 458 10*3/uL — ABNORMAL HIGH (ref 150–400)
RBC: 4.1 MIL/uL (ref 3.87–5.11)
RDW: 12.9 % (ref 11.5–15.5)
WBC: 7.7 10*3/uL (ref 4.0–10.5)

## 2016-03-06 LAB — COMPREHENSIVE METABOLIC PANEL
ALBUMIN: 3.6 g/dL (ref 3.5–5.0)
ALK PHOS: 87 U/L (ref 38–126)
ALT: 14 U/L (ref 14–54)
ANION GAP: 9 (ref 5–15)
AST: 19 U/L (ref 15–41)
BILIRUBIN TOTAL: 0.5 mg/dL (ref 0.3–1.2)
BUN: 12 mg/dL (ref 6–20)
CALCIUM: 8.8 mg/dL — AB (ref 8.9–10.3)
CO2: 26 mmol/L (ref 22–32)
CREATININE: 0.74 mg/dL (ref 0.44–1.00)
Chloride: 102 mmol/L (ref 101–111)
GFR calc Af Amer: >60 mL/min (ref >60)
GFR calc non Af Amer: >60 mL/min (ref >60)
GLUCOSE: 92 mg/dL (ref 65–99)
Potassium: 4 mmol/L (ref 3.5–5.1)
SODIUM: 137 mmol/L (ref 135–145)
TOTAL PROTEIN: 7.6 g/dL (ref 6.5–8.1)

## 2016-03-06 LAB — URINALYSIS, ROUTINE W REFLEX MICROSCOPIC
GLUCOSE, UA: NEGATIVE mg/dL
HGB URINE DIPSTICK: NEGATIVE
KETONES UR: 5 mg/dL — AB
LEUKOCYTES UA: NEGATIVE
NITRITE: NEGATIVE
PROTEIN: 30 mg/dL — AB
Specific Gravity, Urine: 1.029 (ref 1.005–1.030)
pH: 8 (ref 5.0–8.0)

## 2016-03-06 LAB — ABO/RH: ABO/RH(D): O POS

## 2016-03-06 NOTE — Telephone Encounter (Signed)
Informed patient of CA 125 and CEA results.  All questions answered.  Advised to call for any questions or concerns.    She states she has not heard from the Education officer, museum.  Left a message with the social worker about following up with the patient about her advanced directive before surgery.

## 2016-03-06 NOTE — Patient Instructions (Addendum)
Kim [name]  03/06/2016   Your procedure is scheduled on: 03/12/16  Report to [facility] Main  Entrance take [facility]  elevators to 3rd floor to  [place] at  1000 AM.  Call this number if you have problems the morning of surgery [phone] BEGIN LIGHT DIET--SEE BELOW_ THE DAY BEFORE SURGERY  Remember: ONLY 1 PERSON MAY GO WITH YOU TO SHORT STAY TO GET  READY MORNING OF YOUR SURGERY.  Do not eat food or drink liquids :After Midnight.     Take these medicines the morning of surgery with A SIP OF WATER: Wellbutrin, Prestiq May take Oxycodone if needed                                You may not have any metal on your body including hair pins and              piercings  Do not wear jewelry, make-up, lotions, powders or perfumes, deodorant             Do not wear nail polish.  Do not shave  48 hours prior to surgery.              Men may shave face and neck.   Do not bring valuables to the hospital. [place].  Contacts, dentures or bridgework may not be worn into surgery.  Leave suitcase in the car. After surgery it may be brought to your room.                Please read over the following fact sheets you were given: _____________________________________________________________________             [facility] - Preparing for Surgery Before surgery, you can play an important role.  Because skin is not sterile, your skin needs to be as free of germs as possible.  You can reduce the number of germs on your skin by washing with CHG (chlorahexidine gluconate) soap before surgery.  CHG is an antiseptic cleaner which kills germs and bonds with the skin to continue killing germs even after washing. Please DO NOT use if you have an allergy to CHG or antibacterial soaps.  If your skin becomes reddened/irritated stop using the CHG and inform your nurse when you arrive at Short Stay. Do not shave (including legs  and underarms) for at least 48 hours prior to the first CHG shower.  You may shave your face/neck. Please follow these instructions carefully:  1.  Shower with CHG Soap the night before surgery and the  morning of Surgery.  2.  If you choose to wash your hair, wash your hair first as usual with your  normal  shampoo.  3.  After you shampoo, rinse your hair and body thoroughly to remove the  shampoo.                           4.  Use CHG as you would any other liquid soap.  You can apply chg directly  to the skin and wash                       Gently with a  scrungie or clean washcloth.  5.  Apply the CHG Soap to your body ONLY FROM THE NECK DOWN.   Do not use on face/ open                           Wound or open sores. Avoid contact with eyes, ears mouth and genitals (private parts).                       Wash face,  Genitals (private parts) with your normal soap.             6.  Wash thoroughly, paying special attention to the area where your surgery  will be performed.  7.  Thoroughly rinse your body with warm water from the neck down.  8.  DO NOT shower/wash with your normal soap after using and rinsing off  the CHG Soap.                9.  Pat yourself dry with a clean towel.            10.  Wear clean pajamas.            11.  Place clean sheets on your bed the night of your first shower and do not  sleep with pets. Day of Surgery : Do not apply any lotions/deodorants the morning of surgery.  Please wear clean clothes to the hospital/surgery center.  FAILURE TO FOLLOW THESE INSTRUCTIONS MAY RESULT IN THE CANCELLATION OF YOUR SURGERY PATIENT SIGNATURE_________________________________  NURSE SIGNATURE__________________________________  ________________________________________________________________________   Adam Phenix  An incentive spirometer is a tool that can help keep your lungs clear and active. This tool measures how well you are filling your lungs with each breath.  Taking long deep breaths may help reverse or decrease the chance of developing breathing (pulmonary) problems (especially infection) following:  A long period of time when you are unable to move or be active. BEFORE THE PROCEDURE   If the spirometer includes an indicator to show your best effort, your nurse or respiratory therapist will set it to a desired goal.  If possible, sit up straight or lean slightly forward. Try not to slouch.  Hold the incentive spirometer in an upright position. INSTRUCTIONS FOR USE  1. Sit on the edge of your bed if possible, or sit up as far as you can in bed or on a chair. 2. Hold the incentive spirometer in an upright position. 3. Breathe out normally. 4. Place the mouthpiece in your mouth and seal your lips tightly around it. 5. Breathe in slowly and as deeply as possible, raising the piston or the ball toward the top of the column. 6. Hold your breath for 3-5 seconds or for as long as possible. Allow the piston or ball to fall to the bottom of the column. 7. Remove the mouthpiece from your mouth and breathe out normally. 8. Rest for a few seconds and repeat Steps 1 through 7 at least 10 times every 1-2 hours when you are awake. Take your time and take a few normal breaths between deep breaths. 9. The spirometer may include an indicator to show your best effort. Use the indicator as a goal to work toward during each repetition. 10. After each set of 10 deep breaths, practice coughing to be sure your lungs are clear. If you have an incision (the cut made at the time of surgery), support your incision  when coughing by placing a pillow or rolled up towels firmly against it. Once you are able to get out of bed, walk around indoors and cough well. You may stop using the incentive spirometer when instructed by your caregiver.  RISKS AND COMPLICATIONS  Take your time so you do not get dizzy or light-headed.  If you are in pain, you may need to take or ask for pain  medication before doing incentive spirometry. It is harder to take a deep breath if you are having pain. AFTER USE  Rest and breathe slowly and easily.  It can be helpful to keep track of a log of your progress. Your caregiver can provide you with a simple table to help with this. If you are using the spirometer at home, follow these instructions: [place] IF:   You are having difficultly using the spirometer.  You have trouble using the spirometer as often as instructed.  Your pain medication is not giving enough relief while using the spirometer.  You develop fever of 100.5 F (38.1 C) or higher. SEEK IMMEDIATE MEDICAL CARE IF:   You cough up bloody sputum that had not been present before.  You develop fever of 102 F (38.9 C) or greater.  You develop worsening pain at or near the incision site. MAKE SURE YOU:   Understand these instructions.  Will watch your condition.  Will get help right away if you are not doing well or get worse. Document Released: 07/13/2006 Document Revised: 05/25/2011 Document Reviewed: 09/13/2006 ExitCare Patient Information 2014 ExitCare, [place].   ________________________________________________________________________  WHAT IS A BLOOD TRANSFUSION? Blood Transfusion Information  A transfusion is the replacement of blood or some of its parts. Blood is made up of multiple cells which provide different functions.  Red blood cells carry oxygen and are used for blood loss replacement.  White blood cells fight against infection.  Platelets control bleeding.  Plasma helps clot blood.  Other blood products are available for specialized needs, such as hemophilia or other clotting disorders. BEFORE THE TRANSFUSION  Who gives blood for transfusions?   Healthy volunteers who are fully evaluated to make sure their blood is safe. This is blood bank blood. Transfusion therapy is the safest it has ever been in the practice of medicine.  Before blood is taken from a donor, a complete history is taken to make sure that person has no history of diseases nor engages in risky social behavior (examples are intravenous drug use or sexual activity with multiple partners). The donor's travel history is screened to minimize risk of transmitting infections, such as malaria. The donated blood is tested for signs of infectious diseases, such as HIV and hepatitis. The blood is then tested to be sure it is compatible with you in order to minimize the chance of a transfusion reaction. If you or a relative donates blood, this is often done in anticipation of surgery and is not appropriate for emergency situations. It takes many days to process the donated blood. RISKS AND COMPLICATIONS Although transfusion therapy is very safe and saves many lives, the main dangers of transfusion include:   Getting an infectious disease.  Developing a transfusion reaction. This is an allergic reaction to something in the blood you were given. Every precaution is taken to prevent this. The decision to have a blood transfusion has been considered carefully by your caregiver before blood is given. Blood is not given unless the benefits outweigh the risks. AFTER THE TRANSFUSION  Right after  receiving a blood transfusion, you will usually feel much better and more energetic. This is especially true if your red blood cells have gotten low (anemic). The transfusion raises the level of the red blood cells which carry oxygen, and this usually causes an energy increase.  The nurse administering the transfusion will monitor you carefully for complications. HOME CARE INSTRUCTIONS  No special instructions are needed after a transfusion. You may find your energy is better. Speak with your caregiver about any limitations on activity for underlying diseases you may have. SEEK MEDICAL CARE IF:   Your condition is not improving after your transfusion.  You develop redness or  irritation at the intravenous (IV) site. SEEK IMMEDIATE MEDICAL CARE IF:  Any of the following symptoms occur over the next 12 hours:  Shaking chills.  You have a temperature by mouth above 102 F (38.9 C), not controlled by medicine.  Chest, back, or muscle pain.  People around you feel you are not acting correctly or are confused.  Shortness of breath or difficulty breathing.  Dizziness and fainting.  You get a rash or develop hives.  You have a decrease in urine output.  Your urine turns a dark color or changes to pink, red, or brown. Any of the following symptoms occur over the next 10 days:  You have a temperature by mouth above 102 F (38.9 C), not controlled by medicine.  Shortness of breath.  Weakness after normal activity.  The white part of the eye turns yellow (jaundice).  You have a decrease in the amount of urine or are urinating less often.  Your urine turns a dark color or changes to pink, red, or brown. Document Released: 02/28/2000 Document Revised: 05/25/2011 Document Reviewed: 10/17/2007 ExitCare Patient Information 2014 ExitCare, [place].  _______________________________________________________________________Eat a light diet the day before surgery.  Examples including soups, broths, toast, yogurt, mashed potatoes.  Things to avoid include carbonated beverages (fizzy beverages), raw fruits and raw vegetables, or beans.   If your bowels are filled with gas, your surgeon will have difficulty visualizing your pelvic organs which increases your surgical risks.

## 2016-03-06 NOTE — Progress Notes (Signed)
Per Dr Conrad Mission, anesthesia do not need urine drug screen am of OR unless symptomatic

## 2016-03-10 ENCOUNTER — Other Ambulatory Visit (HOSPITAL_COMMUNITY): Payer: Self-pay | Admitting: *Deleted

## 2016-03-10 ENCOUNTER — Encounter: Payer: Self-pay | Admitting: *Deleted

## 2016-03-10 NOTE — Progress Notes (Signed)
[place] Social Work  Clinical Social Work was referred by GYN Oncology to review and complete healthcare advance directives.   Clinical Social Worker met with patient and patient's friend, Saralyn Pilar, in [place] office.  Ms. Howk shared she is having surgery later this week and would like to complete advance directives prior to surgery.  The patient designated friend Windell Moment as their primary healthcare agent and neighbor Denton Ar as their secondary agent.  Patient also completed healthcare living will.  Ms. Tague does not desire life prolonging measures in scenarios identified in the living will.     Clinical Social Worker notarized documents and made copies for patient/family. Clinical Social Worker will send documents to medical records to be scanned into patient's chart. Clinical Social Worker encouraged patient/family to contact with any additional questions or concerns.  Polo Riley, MSW, [place] Worker [facility] [phone]

## 2016-03-12 ENCOUNTER — Ambulatory Visit (HOSPITAL_COMMUNITY): Payer: Self-pay | Admitting: Certified Registered Nurse Anesthetist

## 2016-03-12 ENCOUNTER — Encounter (HOSPITAL_COMMUNITY): Payer: Self-pay | Admitting: *Deleted

## 2016-03-12 ENCOUNTER — Inpatient Hospital Stay (HOSPITAL_COMMUNITY)
Admission: RE | Admit: 2016-03-12 | Discharge: 2016-03-15 | DRG: 982 | Disposition: A | Discharge status: Home / Self Care | Payer: Self-pay | Source: Ambulatory Visit | Attending: Gynecologic Oncology | Admitting: Gynecologic Oncology

## 2016-03-12 ENCOUNTER — Encounter (HOSPITAL_COMMUNITY)
Admission: RE | Disposition: A | Discharge status: Home / Self Care | Payer: Self-pay | Source: Ambulatory Visit | Attending: Gynecologic Oncology

## 2016-03-12 DIAGNOSIS — R1909 Other intra-abdominal and pelvic swelling, mass and lump: Secondary | ICD-10-CM

## 2016-03-12 DIAGNOSIS — B009 Herpesviral infection, unspecified: Secondary | ICD-10-CM | POA: Diagnosis present

## 2016-03-12 DIAGNOSIS — Z809 Family history of malignant neoplasm, unspecified: Secondary | ICD-10-CM

## 2016-03-12 DIAGNOSIS — K449 Diaphragmatic hernia without obstruction or gangrene: Secondary | ICD-10-CM | POA: Diagnosis present

## 2016-03-12 DIAGNOSIS — Z79899 Other long term (current) drug therapy: Secondary | ICD-10-CM

## 2016-03-12 DIAGNOSIS — R970 Elevated carcinoembryonic antigen [CEA]: Secondary | ICD-10-CM

## 2016-03-12 DIAGNOSIS — F909 Attention-deficit hyperactivity disorder, unspecified type: Secondary | ICD-10-CM | POA: Diagnosis present

## 2016-03-12 DIAGNOSIS — D4959 Neoplasm of unspecified behavior of other genitourinary organ: Principal | ICD-10-CM | POA: Diagnosis present

## 2016-03-12 DIAGNOSIS — R188 Other ascites: Secondary | ICD-10-CM | POA: Diagnosis present

## 2016-03-12 DIAGNOSIS — R1900 Intra-abdominal and pelvic swelling, mass and lump, unspecified site: Secondary | ICD-10-CM

## 2016-03-12 DIAGNOSIS — Z880 Allergy status to penicillin: Secondary | ICD-10-CM

## 2016-03-12 HISTORY — PX: LAPAROTOMY: SHX154

## 2016-03-12 HISTORY — PX: SALPINGOOPHORECTOMY: SHX82

## 2016-03-12 HISTORY — PX: ABDOMINAL HYSTERECTOMY: SHX81

## 2016-03-12 HISTORY — PX: APPENDECTOMY: SHX54

## 2016-03-12 LAB — TYPE AND SCREEN
ABO/RH(D): O POS
Antibody Screen: NEGATIVE

## 2016-03-12 SURGERY — HYSTERECTOMY, ABDOMINAL
Anesthesia: General

## 2016-03-12 MED ORDER — ROCURONIUM BROMIDE 50 MG/5ML IV SOSY
PREFILLED_SYRINGE | INTRAVENOUS | Status: AC
  Filled 2016-03-12: qty 5

## 2016-03-12 MED ORDER — SODIUM CHLORIDE 0.9 % IJ SOLN
INTRAMUSCULAR | Status: AC
  Filled 2016-03-12: qty 50

## 2016-03-12 MED ORDER — 0.9 % SODIUM CHLORIDE (POUR BTL) OPTIME
TOPICAL | Status: DC | PRN
  Administered 2016-03-12: 2000 mL

## 2016-03-12 MED ORDER — ALBUMIN HUMAN 5 % IV SOLN
INTRAVENOUS | Status: AC
  Filled 2016-03-12: qty 250

## 2016-03-12 MED ORDER — KETOROLAC TROMETHAMINE 30 MG/ML IJ SOLN: 15.0000 mg | Freq: Four times a day (QID) | INTRAMUSCULAR | Status: AC

## 2016-03-12 MED ORDER — FENTANYL CITRATE (PF) 100 MCG/2ML IJ SOLN: 25.0000 ug | INTRAMUSCULAR | Status: DC | PRN

## 2016-03-12 MED ORDER — GABAPENTIN 300 MG PO CAPS
600.0000 mg | ORAL_CAPSULE | Freq: Every day | ORAL | Status: AC
  Administered 2016-03-12 – 2016-03-13 (×2): 600 mg via ORAL
  Filled 2016-03-12 (×2): qty 2

## 2016-03-12 MED ORDER — ONDANSETRON HCL 4 MG/2ML IJ SOLN
4.0000 mg | Freq: Once | INTRAMUSCULAR | Status: AC | PRN
  Administered 2016-03-12: 4 mg via INTRAVENOUS

## 2016-03-12 MED ORDER — MIDAZOLAM HCL 2 MG/2ML IJ SOLN
INTRAMUSCULAR | Status: DC | PRN
  Administered 2016-03-12 (×2): 1 mg via INTRAVENOUS

## 2016-03-12 MED ORDER — LACTATED RINGERS IV SOLN
INTRAVENOUS | Status: DC | PRN
  Administered 2016-03-12 (×3): via INTRAVENOUS

## 2016-03-12 MED ORDER — SUGAMMADEX SODIUM 200 MG/2ML IV SOLN
INTRAVENOUS | Status: AC
  Filled 2016-03-12: qty 2

## 2016-03-12 MED ORDER — ONDANSETRON HCL 4 MG PO TABS: 4.0000 mg | ORAL_TABLET | Freq: Four times a day (QID) | ORAL | Status: DC | PRN

## 2016-03-12 MED ORDER — BUPIVACAINE HCL 0.25 % IJ SOLN
INTRAMUSCULAR | Status: DC | PRN
  Administered 2016-03-12: 30 mL

## 2016-03-12 MED ORDER — ATORVASTATIN CALCIUM 20 MG PO TABS
20.0000 mg | ORAL_TABLET | Freq: Every day | ORAL | Status: DC
  Administered 2016-03-12 – 2016-03-14 (×3): 20 mg via ORAL
  Filled 2016-03-12 (×3): qty 1

## 2016-03-12 MED ORDER — BUPROPION HCL ER (XL) 300 MG PO TB24
300.0000 mg | ORAL_TABLET | Freq: Every morning | ORAL | Status: DC
  Administered 2016-03-13 – 2016-03-15 (×4): 300 mg via ORAL
  Filled 2016-03-12 (×4): qty 1

## 2016-03-12 MED ORDER — DEXAMETHASONE SODIUM PHOSPHATE 10 MG/ML IJ SOLN
INTRAMUSCULAR | Status: AC
  Filled 2016-03-12: qty 1

## 2016-03-12 MED ORDER — CLINDAMYCIN PHOSPHATE 900 MG/50ML IV SOLN
INTRAVENOUS | Status: AC
  Filled 2016-03-12: qty 50

## 2016-03-12 MED ORDER — ACETAMINOPHEN 10 MG/ML IV SOLN
1000.0000 mg | Freq: Once | INTRAVENOUS | Status: AC
  Administered 2016-03-12: 1000 mg via INTRAVENOUS

## 2016-03-12 MED ORDER — SENNOSIDES-DOCUSATE SODIUM 8.6-50 MG PO TABS
2.0000 | ORAL_TABLET | Freq: Every day | ORAL | Status: DC
  Administered 2016-03-12 – 2016-03-14 (×3): 2 via ORAL
  Filled 2016-03-12 (×3): qty 2

## 2016-03-12 MED ORDER — KETOROLAC TROMETHAMINE 30 MG/ML IJ SOLN
15.0000 mg | Freq: Four times a day (QID) | INTRAMUSCULAR | Status: AC
  Administered 2016-03-12 – 2016-03-13 (×4): 15 mg via INTRAVENOUS
  Filled 2016-03-12 (×4): qty 1

## 2016-03-12 MED ORDER — ONDANSETRON HCL 4 MG/2ML IJ SOLN
4.0000 mg | Freq: Four times a day (QID) | INTRAMUSCULAR | Status: DC | PRN
  Administered 2016-03-13 – 2016-03-15 (×2): 4 mg via INTRAVENOUS
  Filled 2016-03-12 (×2): qty 2

## 2016-03-12 MED ORDER — CIPROFLOXACIN IN D5W 400 MG/200ML IV SOLN
400.0000 mg | INTRAVENOUS | Status: AC
  Administered 2016-03-12: 400 mg via INTRAVENOUS

## 2016-03-12 MED ORDER — KCL IN DEXTROSE-NACL 20-5-0.45 MEQ/L-%-% IV SOLN
INTRAVENOUS | Status: DC
  Administered 2016-03-12: 20:00:00 via INTRAVENOUS
  Filled 2016-03-12: qty 1000

## 2016-03-12 MED ORDER — FENTANYL CITRATE (PF) 100 MCG/2ML IJ SOLN
INTRAMUSCULAR | Status: AC
  Filled 2016-03-12: qty 2

## 2016-03-12 MED ORDER — HYDROMORPHONE HCL 1 MG/ML IJ SOLN
INTRAMUSCULAR | Status: AC
  Filled 2016-03-12: qty 1

## 2016-03-12 MED ORDER — LIDOCAINE 2% (20 MG/ML) 5 ML SYRINGE
INTRAMUSCULAR | Status: AC
  Filled 2016-03-12: qty 5

## 2016-03-12 MED ORDER — BUPIVACAINE HCL (PF) 0.25 % IJ SOLN
INTRAMUSCULAR | Status: AC
  Filled 2016-03-12: qty 30

## 2016-03-12 MED ORDER — ONDANSETRON HCL 4 MG/2ML IJ SOLN
INTRAMUSCULAR | Status: DC | PRN
  Administered 2016-03-12: 4 mg via INTRAVENOUS

## 2016-03-12 MED ORDER — ACETAMINOPHEN 10 MG/ML IV SOLN
INTRAVENOUS | Status: AC
  Filled 2016-03-12: qty 100

## 2016-03-12 MED ORDER — OXYCODONE HCL 5 MG PO TABS
10.0000 mg | ORAL_TABLET | ORAL | Status: DC | PRN
  Administered 2016-03-12 – 2016-03-15 (×9): 10 mg via ORAL
  Filled 2016-03-12 (×9): qty 2

## 2016-03-12 MED ORDER — ACETAMINOPHEN 500 MG PO TABS
1000.0000 mg | ORAL_TABLET | Freq: Four times a day (QID) | ORAL | Status: DC
  Administered 2016-03-12 – 2016-03-15 (×10): 1000 mg via ORAL
  Filled 2016-03-12 (×11): qty 2

## 2016-03-12 MED ORDER — VENLAFAXINE HCL ER 75 MG PO CP24
75.0000 mg | ORAL_CAPSULE | Freq: Every day | ORAL | Status: DC
  Filled 2016-03-12 (×2): qty 1

## 2016-03-12 MED ORDER — ONDANSETRON HCL 4 MG/2ML IJ SOLN
INTRAMUSCULAR | Status: AC
  Filled 2016-03-12: qty 2

## 2016-03-12 MED ORDER — SUGAMMADEX SODIUM 200 MG/2ML IV SOLN
INTRAVENOUS | Status: DC | PRN
  Administered 2016-03-12 (×2): 100 mg via INTRAVENOUS

## 2016-03-12 MED ORDER — IBUPROFEN 800 MG PO TABS
800.0000 mg | ORAL_TABLET | Freq: Four times a day (QID) | ORAL | Status: DC
  Administered 2016-03-13 – 2016-03-15 (×8): 800 mg via ORAL
  Filled 2016-03-12 (×8): qty 1

## 2016-03-12 MED ORDER — SODIUM CHLORIDE 0.9 % IJ SOLN
INTRAMUSCULAR | Status: AC
  Filled 2016-03-12: qty 20

## 2016-03-12 MED ORDER — CIPROFLOXACIN IN D5W 400 MG/200ML IV SOLN
INTRAVENOUS | Status: AC
  Filled 2016-03-12: qty 200

## 2016-03-12 MED ORDER — LAMOTRIGINE 100 MG PO TABS
200.0000 mg | ORAL_TABLET | Freq: Every day | ORAL | Status: DC
  Administered 2016-03-12 – 2016-03-14 (×3): 200 mg via ORAL
  Filled 2016-03-12 (×3): qty 2

## 2016-03-12 MED ORDER — HYDROMORPHONE HCL 2 MG/ML IJ SOLN
INTRAMUSCULAR | Status: AC
  Filled 2016-03-12: qty 1

## 2016-03-12 MED ORDER — HYDROMORPHONE HCL 1 MG/ML IJ SOLN
0.2500 mg | INTRAMUSCULAR | Status: DC | PRN
  Administered 2016-03-12 (×2): 0.5 mg via INTRAVENOUS

## 2016-03-12 MED ORDER — HYDROMORPHONE HCL 1 MG/ML IJ SOLN
INTRAMUSCULAR | Status: DC | PRN
  Administered 2016-03-12 (×2): 0.5 mg via INTRAVENOUS

## 2016-03-12 MED ORDER — PROPOFOL 10 MG/ML IV BOLUS
INTRAVENOUS | Status: AC
  Filled 2016-03-12: qty 20

## 2016-03-12 MED ORDER — ENOXAPARIN SODIUM 40 MG/0.4ML ~~LOC~~ SOLN
40.0000 mg | SUBCUTANEOUS | Status: DC
  Administered 2016-03-13 – 2016-03-15 (×3): 40 mg via SUBCUTANEOUS
  Filled 2016-03-12 (×3): qty 0.4

## 2016-03-12 MED ORDER — PROPOFOL 10 MG/ML IV BOLUS
INTRAVENOUS | Status: DC | PRN
  Administered 2016-03-12: 120 mg via INTRAVENOUS

## 2016-03-12 MED ORDER — SODIUM CHLORIDE 0.9 % IJ SOLN
INTRAMUSCULAR | Status: AC
  Filled 2016-03-12: qty 10

## 2016-03-12 MED ORDER — BUPIVACAINE LIPOSOME 1.3 % IJ SUSP
20.0000 mL | Freq: Once | INTRAMUSCULAR | Status: AC
  Administered 2016-03-12: 20 mL
  Filled 2016-03-12: qty 20

## 2016-03-12 MED ORDER — METRONIDAZOLE IN NACL 5-0.79 MG/ML-% IV SOLN
500.0000 mg | Freq: Once | INTRAVENOUS | Status: AC
  Administered 2016-03-12: 500 mg via INTRAVENOUS

## 2016-03-12 MED ORDER — ALBUMIN HUMAN 5 % IV SOLN
INTRAVENOUS | Status: DC | PRN
  Administered 2016-03-12: 15:00:00 via INTRAVENOUS

## 2016-03-12 MED ORDER — HYDROMORPHONE HCL 2 MG/ML IJ SOLN
0.5000 mg | INTRAMUSCULAR | Status: AC | PRN
  Administered 2016-03-12 – 2016-03-13 (×2): 0.5 mg via INTRAVENOUS
  Filled 2016-03-12 (×2): qty 1

## 2016-03-12 MED ORDER — ROCURONIUM BROMIDE 10 MG/ML (PF) SYRINGE
PREFILLED_SYRINGE | INTRAVENOUS | Status: DC | PRN
  Administered 2016-03-12: 50 mg via INTRAVENOUS
  Administered 2016-03-12 (×3): 10 mg via INTRAVENOUS

## 2016-03-12 MED ORDER — ENSURE ENLIVE PO LIQD
237.0000 mL | Freq: Two times a day (BID) | ORAL | Status: DC
  Administered 2016-03-13 – 2016-03-15 (×4): 237 mL via ORAL

## 2016-03-12 MED ORDER — METRONIDAZOLE IN NACL 5-0.79 MG/ML-% IV SOLN
INTRAVENOUS | Status: AC
  Filled 2016-03-12: qty 100

## 2016-03-12 MED ORDER — CLINDAMYCIN PHOSPHATE 900 MG/50ML IV SOLN
900.0000 mg | INTRAVENOUS | Status: AC
  Administered 2016-03-12: 900 mg via INTRAVENOUS

## 2016-03-12 MED ORDER — FENTANYL CITRATE (PF) 100 MCG/2ML IJ SOLN
INTRAMUSCULAR | Status: DC | PRN
Start: 2016-03-12 — End: 2016-03-12
  Administered 2016-03-12 (×3): 50 ug via INTRAVENOUS
  Administered 2016-03-12: 150 ug via INTRAVENOUS

## 2016-03-12 MED ORDER — ENOXAPARIN SODIUM 40 MG/0.4ML ~~LOC~~ SOLN
40.0000 mg | SUBCUTANEOUS | Status: AC
  Administered 2016-03-12: 40 mg via SUBCUTANEOUS
  Filled 2016-03-12: qty 0.4

## 2016-03-12 MED ORDER — MIDAZOLAM HCL 2 MG/2ML IJ SOLN
INTRAMUSCULAR | Status: AC
  Filled 2016-03-12: qty 2

## 2016-03-12 MED ORDER — LIDOCAINE 2% (20 MG/ML) 5 ML SYRINGE
INTRAMUSCULAR | Status: DC | PRN
  Administered 2016-03-12: 100 mg via INTRAVENOUS

## 2016-03-12 MED ORDER — DEXAMETHASONE SODIUM PHOSPHATE 10 MG/ML IJ SOLN
INTRAMUSCULAR | Status: DC | PRN
  Administered 2016-03-12: 10 mg via INTRAVENOUS

## 2016-03-12 MED ORDER — KETAMINE HCL 10 MG/ML IJ SOLN
INTRAMUSCULAR | Status: DC | PRN
  Administered 2016-03-12 (×2): 10 mg via INTRAVENOUS
  Administered 2016-03-12: 30 mg via INTRAVENOUS
  Administered 2016-03-12: 10 mg via INTRAVENOUS

## 2016-03-12 MED ORDER — KETAMINE HCL 10 MG/ML IJ SOLN
INTRAMUSCULAR | Status: AC
  Filled 2016-03-12: qty 1

## 2016-03-12 MED ORDER — SODIUM CHLORIDE 0.9 % IJ SOLN
INTRAMUSCULAR | Status: DC | PRN
  Administered 2016-03-12: 20 mL

## 2016-03-12 SURGICAL SUPPLY — 69 items
ADH SKN CLS APL DERMABOND .7 (GAUZE/BANDAGES/DRESSINGS)
AGENT HMST MTR 8 SURGIFLO (HEMOSTASIS)
ATTRACTOMAT 16X20 MAGNETIC DRP (DRAPES) ×4 IMPLANT
BLADE EXTENDED COATED 6.5IN (ELECTRODE) ×4 IMPLANT
CELLS DAT CNTRL 66122 CELL SVR (MISCELLANEOUS) IMPLANT
CHLORAPREP W/TINT 26ML (MISCELLANEOUS) ×4 IMPLANT
CLIP TI LARGE 6 (CLIP) ×4 IMPLANT
CLIP TI MEDIUM 6 (CLIP) ×4 IMPLANT
CLIP TI MEDIUM LARGE 6 (CLIP) ×4 IMPLANT
CONT SPEC 4OZ CLIKSEAL STRL BL (MISCELLANEOUS) ×4 IMPLANT
COVER SURGICAL LIGHT HANDLE (MISCELLANEOUS) ×4 IMPLANT
DERMABOND ADVANCED (GAUZE/BANDAGES/DRESSINGS)
DERMABOND ADVANCED .7 DNX12 (GAUZE/BANDAGES/DRESSINGS) IMPLANT
DRAPE INCISE IOBAN 66X45 STRL (DRAPES) ×4 IMPLANT
DRAPE WARM FLUID 44X44 (DRAPE) ×4 IMPLANT
DRSG OPSITE POSTOP 4X10 (GAUZE/BANDAGES/DRESSINGS) IMPLANT
DRSG OPSITE POSTOP 4X12 (GAUZE/BANDAGES/DRESSINGS) ×1 IMPLANT
DRSG OPSITE POSTOP 4X8 (GAUZE/BANDAGES/DRESSINGS) IMPLANT
ELECT REM PT RETURN 9FT ADLT (ELECTROSURGICAL) ×4
ELECTRODE REM PT RTRN 9FT ADLT (ELECTROSURGICAL) ×3 IMPLANT
GAUZE SPONGE 4X4 12PLY STRL (GAUZE/BANDAGES/DRESSINGS) ×3 IMPLANT
GAUZE SPONGE 4X4 16PLY XRAY LF (GAUZE/BANDAGES/DRESSINGS) IMPLANT
GLOVE BIO SURGEON STRL SZ 6 (GLOVE) ×9 IMPLANT
GLOVE BIO SURGEON STRL SZ 6.5 (GLOVE) ×8 IMPLANT
GOWN STRL REUS W/ TWL LRG LVL3 (GOWN DISPOSABLE) ×6 IMPLANT
GOWN STRL REUS W/TWL LRG LVL3 (GOWN DISPOSABLE) ×8
HEMOSTAT ARISTA ABSORB 3G PWDR (MISCELLANEOUS) IMPLANT
KIT BASIN OR (CUSTOM PROCEDURE TRAY) ×4 IMPLANT
LIGASURE IMPACT 36 18CM CVD LR (INSTRUMENTS) ×1 IMPLANT
LOOP VESSEL MAXI BLUE (MISCELLANEOUS) ×1 IMPLANT
NEEDLE HYPO 22GX1.5 SAFETY (NEEDLE) ×8 IMPLANT
NS IRRIG 1000ML POUR BTL (IV SOLUTION) ×12 IMPLANT
PACK GENERAL/GYN (CUSTOM PROCEDURE TRAY) ×4 IMPLANT
RELOAD PROXIMATE 75MM BLUE (ENDOMECHANICALS) IMPLANT
RELOAD PROXIMATE TA60MM BLUE (ENDOMECHANICALS) IMPLANT
RELOAD STAPLE 60 BLU REG PROX (ENDOMECHANICALS) IMPLANT
RELOAD STAPLE 75 3.8 BLU REG (ENDOMECHANICALS) IMPLANT
RETRACTOR WND ALEXIS 18 MED (MISCELLANEOUS) IMPLANT
RETRACTOR WND ALEXIS 25 LRG (MISCELLANEOUS) IMPLANT
RTRCTR WOUND ALEXIS 18CM MED (MISCELLANEOUS)
RTRCTR WOUND ALEXIS 25CM LRG (MISCELLANEOUS)
SEALER TISSUE X1 CVD JAW (INSTRUMENTS) IMPLANT
SHEET LAVH (DRAPES) ×4 IMPLANT
SPOGE SURGIFLO 8M (HEMOSTASIS)
SPONGE LAP 18X18 X RAY DECT (DISPOSABLE) ×2 IMPLANT
SPONGE SURGIFLO 8M (HEMOSTASIS) IMPLANT
STAPLER GUN LINEAR PROX 60 (STAPLE) IMPLANT
STAPLER PROXIMATE 75MM BLUE (STAPLE) ×2 IMPLANT
STAPLER VISISTAT 35W (STAPLE) IMPLANT
SUT MNCRL AB 4-0 PS2 18 (SUTURE) ×2 IMPLANT
SUT PDS AB 1 TP1 96 (SUTURE) ×8 IMPLANT
SUT PROLENE 5 0 CC 1 (SUTURE) IMPLANT
SUT SILK 3 0 SH CR/8 (SUTURE) ×4 IMPLANT
SUT VIC AB 0 CT1 36 (SUTURE) ×16 IMPLANT
SUT VIC AB 2-0 CT1 36 (SUTURE) ×8 IMPLANT
SUT VIC AB 2-0 CT2 27 (SUTURE) ×24 IMPLANT
SUT VIC AB 2-0 SH 27 (SUTURE)
SUT VIC AB 2-0 SH 27X BRD (SUTURE) ×6 IMPLANT
SUT VIC AB 3-0 CTX 36 (SUTURE) IMPLANT
SUT VIC AB 3-0 SH 18 (SUTURE) ×3 IMPLANT
SUT VIC AB 3-0 SH 27 (SUTURE) ×16
SUT VIC AB 3-0 SH 27X BRD (SUTURE) ×3 IMPLANT
SUT VICRYL 2 0 18  UND BR (SUTURE) ×1
SUT VICRYL 2 0 18 UND BR (SUTURE) ×3 IMPLANT
SYR 30ML LL (SYRINGE) ×8 IMPLANT
TOWEL OR 17X26 10 PK STRL BLUE (TOWEL DISPOSABLE) ×7 IMPLANT
TOWEL OR NON WOVEN STRL DISP B (DISPOSABLE) ×4 IMPLANT
TRAY FOLEY W/METER SILVER 16FR (SET/KITS/TRAYS/PACK) ×4 IMPLANT
UNDERPAD 30X30 INCONTINENT (UNDERPADS AND DIAPERS) ×4 IMPLANT

## 2016-03-12 NOTE — Transfer of Care (Signed)
Immediate Anesthesia Transfer of Care Note  Patient: Kim [name]  Procedure(s) Performed: Procedure(s): HYSTERECTOMY ABDOMINAL TOTAL , OMENTECTOMY, RADICAL TUMOR DEBULKING (N/A) BILATERAL SALPINGO OOPHORECTOMY (Bilateral) EXPLORATORY LAPAROTOMY (N/A) APPENDECTOMY  Patient Location: PACU  Anesthesia Type:General  Level of Consciousness: awake, alert , oriented and patient cooperative  Airway & Oxygen Therapy: Patient Spontanous Breathing and Patient connected to face mask oxygen  Post-op Assessment: Report given to RN and Post -op Vital signs reviewed and stable  Post vital signs: Reviewed and stable  Last Vitals:  Vitals:   03/12/16 1006  BP: (!) 108/92  Pulse: (!) 105  Resp: 16  Temp: 36.7 C    Last Pain:  Vitals:   03/12/16 1006  TempSrc: Oral      Patients Stated Pain Goal: 3 ([id number])  Complications: No apparent anesthesia complications

## 2016-03-12 NOTE — Op Note (Signed)
PATIENT: Kim [name] DATE: 03/12/16   Preop Diagnosis: pelvic/abdominal mass, elevated CEA  Postoperative Diagnosis: same  Surgery: Total abdominal hysterectomy, bilateral salpingo-oophorectomy, appendectomy, omentectomy, radical tumor debulking surgery.  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 Nodular thickened appendix with mucin erupting from the tip. Otherwise normal small and large intestine. Normal appearing uterus and right ovary. 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 No residual tumor visible or palpable at the completion of the surgery. 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  Procedure: The patient was identified in the preoperative holding area. Informed consent was signed on the chart. Patient was seen history was reviewed and exam was performed.   The patient was then taken to the operating room and placed in the supine position with SCD hose on. General anesthesia was then induced without difficulty. She was then placed in the dorsolithotomy position. The abdomen was prepped with chlor prep sponges per protocol. Perineum was prepped with Betadine. The vagina was prepped with Betadine a Foley catheter was inserted into the bladder under sterile conditions.  The patient was then draped after the prep was dried. Timeout was performed the patient, procedure, antibiotic, allergy, and length of procedure. A  vertical midline infraumbilical incision was and carried down to the underlying fascia using Bovie cautery. The fascia was scored in the fascial incision was extended superiorly and inferiorly using Bovie cautery. The rectus bellies were dissected off the overlying fascia. The peritoneum was tented and entered. The peritoneal incision was extended superiorly and inferiorly with visualization of the underlying peritoneal cavity.   Large volume of mucinous fluid and debris was evacuated from peritoneal cavity. The 30cm left ovarian mass was delivered through the abdominal incision. Due to the large size of the mass which was under tension, the mass spontaneously ruptured during delivery. It contained thick mucinous fluid.  The IP ligament on the left and utero-ovarian ligament was cross clamped and transected and the mass was sent for frozen section. The appendix was inspected and the tip was noted to be thickened and nodular. A window was made in the base of the mesoappendix. The ligasure was used to transect the mesoappendix. The GI stapler was used to transect the appendix at its base with the cecum. 3-0 vicryl suture was used to imbricate the staple line.  Frozen section returned with a result unable to determine if maligancy was present (only mucinous tissue was seen microscopically, no malignant tissue).  The Buchwalter self-retaining retractor was then placed. At the initial placement as well as at several points during the case the lateral blades were checked to ensure no significant pressure on the psoas bellies.  The small and large bowel were packed out of the way of the surgical field with moist laparotomy sponges and malleable retractors were attached to the [place]. Abdominal pelvic washings were obtained. The round ligament on the patient's right side was transected with monopolar cautery the anterior posterior leaves the broad ligament were opened. A window was made between the  infundibulopelvic vessels  and the ureter. The vessels were clamped x2 transected and suture ligated. The bladder flap was created at this point and the uterine vessels on the right side were skeletonized. The uterine vessels were clamped with curved Masterson clamps transected and suture ligated. Our attention was turned to the left side were similar procedure was performed in that the round ligament was transected and the anterior and posterior leaves the broad ligament were opened. The left ureter was skeletonized to free it from the left ovarian fossa in order to allow for resection of the left pelvic fossa nodularity with the uterine specimen. A window was made between the vessels and the ureter on the left and the vessels were clamped x2 transected and suture ligated. The uterine vessels were skeletonized clamped x2 transected and suture ligated. We continued down the cardinal ligaments with straight Masterson clamps the cervicovaginal junction was reached. We came across the vagina just under the cervix with curved Masterson clamps. The cervix was amputated from the vagina. The angle pedicles as well as vaginal cuff were closed using figure-of-eight sutures of 0 Vicryl.  The omentectomy was peformed by opening the peritoneal attachments to the transverse colon then creating windows in the infracolic omentum and using the ligasure to seal and transect pedicles.  The pedicles were noted to be hemostatic. The abdomen pelvis were copiously irrigated. The retractor and laparotomy sponges were removed. The fascia was closed using running mass closure of #1 PDS. The subcutaneous tissues were irrigated and made hemostatic. 20 mL of Exparel within 20 mL of normal saline and 40cc of quarter percent marcaine was injected for postoperative pain control. Surgical team changed their gloves. The skin was closed using staples.  All instrument, suture, laparotomy, Ray-Tec, and needle counts were correct x2. The patient  tolerated the procedure well and was taken recovery room in stable condition. This is Everitt Amber dictating an operative note on Kim [name].

## 2016-03-12 NOTE — H&P (View-Only) (Signed)
GYN oncology New Patient Consultation.  Shauniece [name] 55 y.o. female  CC:  Chief Complaint  Patient presents with  . abdominal mass    HPI: Patient is seen today in consultation at the request of Dr. Duffy Bruce in the emergency room. Primary physician Dr. Elbert Ewings.  Patient is a 55 year old gravida 1 para 0 aborta 1 who is not sure when she was last menopausal. She underwent an endometrial ablation in 2005 and his had no bleeding since that time. She occasionally still has some hot flashes but states that her estrogen levels are "okay".  Approximately 2 months ago she felt that she was starting to gain weight was having increasing constipation and indigestion. She also started noticing abdominal swelling, bloating, increased flatulence and developed over time early satiety. She states she looked online for number of different diagnoses and she Putting this off. She really didn't have much pain with occasional twinges with the exception of one Saturday 2 weeks ago which time she woke up with severe abdominal pain. This past Friday she saw her primary care physician who recommended that she go to the emergency room. In the emergency room she was noted to have a distended abdomen. CT scan was performed that revealed:  FINDINGS: Lower chest: Mild atelectasis at both lung bases. There is no significant pleural or pericardial effusion. There is a moderate size hiatal hernia.  Hepatobiliary: The liver is normal in density without focal abnormality. No evidence of gallstones, gallbladder wall thickening or biliary dilatation.  Pancreas: Unremarkable. No pancreatic ductal dilatation or surrounding inflammatory changes.  Spleen: Normal in size without focal abnormality.  Adrenals/Urinary Tract: Both adrenal glands appear normal. The kidneys appear normal without evidence of urinary tract calculus, suspicious lesion or hydronephrosis. No bladder abnormalities are seen.  Stomach/Bowel:  No evidence of bowel wall thickening, distention or surrounding inflammatory change. There is peripheral displacement of the bowel by a large cystic pelvic mass, further described below. The appendix is not well visualized, although is in questionable contact with the large cystic pelvic mass.  Vascular/Lymphatic: There are no enlarged abdominal or pelvic lymph nodes. No acute vascular findings are seen. The IVC is mildly compressed at the level of the pelvic brim by the large pelvic mass.  Reproductive: The uterus is posteriorly displaced. No definite normal ovarian tissue is seen. There is a very large complex cystic pelvic mass, extending superiorly above the umbilicus. This measures up to 26.0 x 25.7 x 13.7 cm. There are solid components along the periphery of this lesion, especially posteriorly. There are also thickened septations.  Other: There is a small amount of pelvic ascites. There is some soft tissue stranding in the omentum which could be secondary to vascular congestion, although is worrisome for peritoneal carcinomatosis.  IMPRESSION: 1. 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 No normal ovarian tissue identified. 2. The mass contacts the appendix. 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 3. Small amount of ascites with soft tissue stranding in the omentum worrisome for carcinomatosis. 4. No adenopathy, solid visceral organ metastasis or hydronephrosis. 5. Moderate size hiatal hernia.  She has not had any tumor markers drawn and comes in today for follow-up. She is up-to-date on her mammograms having had one in April of this year. Her colonoscopies are up-to-date having had one in 2010, with 10 year follow-up recommended. She had a Pap smear earlier this year that was normal. She does have a prior history of  cocaine use the last time she used was 2 years ago. She's Hep C negative. She's never used any IV  drugs. She does not use any alcohol.  Review of Systems  Constitutional:   Denies fever. Skin: No rash, sores, jaundice, itching, or dryness.  Cardiovascular: No chest pain, shortness of breath, or edema  Pulmonary: No cough or wheeze.  Gastro Intestinal: Reporting intermittent lower abdominal soreness.  Occ nausea, -vomiting, +constipation, - diarrhea reported.  Genitourinary: No frequency, urgency, or dysuria.  Denies vaginal bleeding and discharge.  Musculoskeletal: No joint swelling or pain.  Neurologic: No weakness Psychology: Long history of psychiatric disease, prior history of suicidal ideation but none in 2 years since she stopped illicit drug use.   Current Meds:  Outpatient Encounter Prescriptions as of 03/04/2016  Medication Sig  . atorvastatin (LIPITOR) 20 MG tablet Take 20 mg by mouth at bedtime.  Marland Kitchen buPROPion (WELLBUTRIN XL) 300 MG 24 hr tablet Take 300 mg by mouth every morning.  . desvenlafaxine (PRISTIQ) 50 MG 24 hr tablet Take 50 mg by mouth every morning.  . lamoTRIgine (LAMICTAL) 100 MG tablet Take 100 mg by mouth at bedtime.  Marland Kitchen oxyCODONE-acetaminophen (ROXICET) 5-325 MG tablet Take 1 tablet by mouth every 4 (four) hours as needed for severe pain.  . RaNITidine HCl (ZANTAC PO) Take 1 tablet by mouth daily as needed (heartburn). Reported on 08/01/2015  . senna-docusate (SENOKOT-S) 8.6-50 MG tablet Take 2 tablets by mouth at bedtime. For constipation  . buPROPion (WELLBUTRIN SR) 150 MG 12 hr tablet Take 150 mg by mouth daily.  . calcium carbonate (TUMS - DOSED IN MG ELEMENTAL CALCIUM) 500 MG chewable tablet Chew 2-3 tablets (400-600 mg of elemental calcium total) by mouth daily as needed for heartburn. (Patient not taking: Reported on 03/04/2016)  . carbamazepine (TEGRETOL) 200 MG tablet Take 1 tablet (200 mg total) by mouth 2 (two) times daily after a meal. (Patient not taking: Reported on 03/04/2016)  . FLUoxetine (PROZAC) 20 MG capsule Take 3 capsules (60 mg total) by  mouth daily. (Patient not taking: Reported on 03/04/2016)  . hydrOXYzine (ATARAX/VISTARIL) 50 MG tablet Take 1 tablet (50 mg total) by mouth 3 (three) times daily as needed for anxiety. (Patient not taking: Reported on 03/04/2016)  . ibuprofen (ADVIL,MOTRIN) 200 MG tablet Take 400 mg by mouth every 6 (six) hours as needed for moderate pain. Reported on 08/01/2015  . lamoTRIgine (LAMICTAL) 200 MG tablet Take 200 mg by mouth at bedtime.  Marland Kitchen lisinopril (PRINIVIL,ZESTRIL) 10 MG tablet Take 1 tablet (10 mg total) by mouth daily. (Patient not taking: Reported on 03/04/2016)  . ondansetron (ZOFRAN ODT) 4 MG disintegrating tablet Take 1 to 2 tabs every 8 hours as needed for nausea. (Patient not taking: Reported on 03/04/2016)  . traZODone (DESYREL) 50 MG tablet Take 1 tablet (50 mg total) by mouth at bedtime as needed for sleep. (Patient not taking: Reported on 03/04/2016)   No facility-administered encounter medications on file as of 03/04/2016.     Allergy:  Allergies  Allergen Reactions  . Penicillins     Unknown reaction.    Social Hx:   Social History   Social History  . Marital status: Single    Spouse name: N/A  . Number of children: N/A  . Years of education: N/A   Occupational History  . Not on file.   Social History Main Topics  . Smoking status: Never Smoker  . Smokeless tobacco: Never Used  . Alcohol use No  Comment: "every 6 mos"  . Drug use: No  . Sexual activity: No   Other Topics Concern  . Not on file   Social History Narrative  . No narrative on file    Past Surgical Hx:  Past Surgical History:  Procedure Laterality Date  . KNEE SURGERY     right    Past Medical Hx:  Past Medical History:  Diagnosis Date  . ADHD (attention deficit hyperactivity disorder)   . Headache(784.0)   . 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    Family Hx:  Family History  Problem Relation Age of Onset  . Cancer Mother      pancreatic    Vitals:  Blood pressure (!) 144/84, pulse (!) 111, temperature 98.4 F (36.9 C), resp. rate 18, height 5' 1.5" (1.562 m), weight 152 lb 9.6 oz (69.2 kg), SpO2 98 %.  Physical Exam:  Well-nourished well-developed female in no acute distress.  Neck: Supple, no lymphadenopathy, no thyromegaly.  Lungs: Clear to auscultation bilaterally.  Cardiac: Regular rate and rhythm.  Abdomen: Well-healed abdominal plasty incision across the lower abdomen. Large abdominal pelvic mass measuring approximately 25 cm. There is no appreciable fluid wave but exam is somewhat limited secondary to the large mass. There is no paraspinal megaly. There is a small reducible umbilical hernia. There is no rebound or guarding.  Groins: No lymphadenopathy.  Extremities: No edema.  Pelvic: External genitalia within normal limits. Vagina is markedly atrophic. The cervix is visualized. It is nulliparous. Bimanual examination the cervix is palpably normal. I cannot separate the mass from the uterus. The masses out of the pelvis. It is mobile and abdominal exam but I cannot appreciated on pelvic exam. Rectovaginal exam confirms no nodularity.  Assessment/Plan: 55 year old with large abdominal pelvic mass measuring 26 x 25.7 x 13.7 cm with both cystic and solid components. There also thickened septations. Of note there is a statement that the appendix is not well visualized though it is a questionable contact with a large cystic pelvic mass. There is no obvious lymphadenopathy. There is a small amount of ascites and some soft tissue stranding in the omentum which could be secondary to vascular congestion although also worrisome for peritoneal carcinomatosis.  I met with the patient and her support system today. Tumor markers have not been drawn and we will check a CA-125 and a CEA.  She is currently scheduled for surgery on December 28 with Dr. Denman George. She will undergo exploratory laparotomy via a vertical  midline incision. We discussed that we would remove the large ovarian mass and he would be sent for frozen section. While we're awaiting frozen section we would perform a hysterectomy and remove the contralateral ovary. They know that additional surgery will be based on the results of the frozen section and it could include completion of the procedure for staging. We discussed that staging would involve omentectomy and pelvic lymph node assessment as well as removal of any abnormal tissues. We discussed that may require resection of the appendix or other areas of the bowel.  Risks of surgery including but not limited to bleeding, infection, injury to surrounding organs were discussed with the patient. She understands that she will be in the hospital anywhere from 3-5 days.  She does not have any living family members. She will attempt to appoint one of her friends as her power of attorney for healthcare needs prior to next Thursday.  Her questions as well as those of her support system with her elicited in answer to their satisfaction. They have our card and knows that we will call them with the results of the CA-125 and CEA tomorrow and make they can call us should they have any questions before next week.  We appreciate the opportunity to partner in the care of this very pleasant patient.  Lovina Zuver A., MD 03/04/2016, 10:37 AM

## 2016-03-12 NOTE — Anesthesia Procedure Notes (Signed)
Procedure Name: Intubation Date/Time: 03/12/2016 1:55 PM Performed by: Danley Danker L Patient Re-evaluated:Patient Re-evaluated prior to inductionOxygen Delivery Method: Circle system utilized Preoxygenation: Pre-oxygenation with 100% oxygen Intubation Type: IV induction Ventilation: Mask ventilation without difficulty and Oral airway inserted - appropriate to patient size Laryngoscope Size: [name] Heck and 2 Grade View: Grade I Tube type: Oral Tube size: 7.5 mm Number of attempts: 1 Airway Equipment and Method: Stylet Placement Confirmation: ETT inserted through vocal cords under direct vision,  positive ETCO2 and breath sounds checked- equal and bilateral Secured at: 21 cm Tube secured with: Tape Dental Injury: Teeth and Oropharynx as per pre-operative assessment

## 2016-03-12 NOTE — Anesthesia Preprocedure Evaluation (Signed)

## 2016-03-12 NOTE — Anesthesia Postprocedure Evaluation (Signed)
Anesthesia Post Note  Patient: Kim [name]  Procedure(s) Performed: Procedure(s) (LRB): HYSTERECTOMY ABDOMINAL TOTAL , OMENTECTOMY, RADICAL TUMOR DEBULKING (N/A) BILATERAL SALPINGO OOPHORECTOMY (Bilateral) EXPLORATORY LAPAROTOMY (N/A) APPENDECTOMY  Patient location during evaluation: PACU Anesthesia Type: General Level of consciousness: sedated Pain management: satisfactory to patient Vital Signs Assessment: post-procedure vital signs reviewed and stable Respiratory status: spontaneous breathing Cardiovascular status: stable Anesthetic complications: no       Last Vitals:  Vitals:   03/12/16 1751 03/12/16 1805  BP: 120/84 (!) 134/91  Pulse: 91 93  Resp: 12 12  Temp: 36.8 C 36.9 C    Last Pain:  Vitals:   03/12/16 1751  TempSrc:   PainSc: 0-No pain                 [name] EDWARD

## 2016-03-12 NOTE — Interval H&P Note (Signed)
History and Physical Interval Note:  03/12/2016 1:30 PM  Kim [name]  has presented today for surgery, with the diagnosis of abdominal mass  The various methods of treatment have been discussed with the patient and family. After consideration of risks, benefits and other options for treatment, the patient has consented to  Procedure(s): HYSTERECTOMY ABDOMINAL TOTAL WITH POSSIBLE STAGING (N/A) BILATERAL SALPINGO OOPHORECTOMY (Bilateral) EXPLORATORY LAPAROTOMY (N/A) as a surgical intervention .  The patient's history has been reviewed, patient examined, no change in status, stable for surgery.  I have reviewed the patient's chart and labs.  Questions were answered to the patient's satisfaction.     Donaciano Eva

## 2016-03-13 ENCOUNTER — Encounter (HOSPITAL_COMMUNITY): Payer: Self-pay | Admitting: Gynecologic Oncology

## 2016-03-13 LAB — BASIC METABOLIC PANEL
ANION GAP: 6 (ref 5–15)
BUN: 8 mg/dL (ref 6–20)
CALCIUM: 8.7 mg/dL — AB (ref 8.9–10.3)
CO2: 26 mmol/L (ref 22–32)
CREATININE: 0.67 mg/dL (ref 0.44–1.00)
Chloride: 108 mmol/L (ref 101–111)
GFR calc Af Amer: >60 mL/min (ref >60)
GLUCOSE: 158 mg/dL — AB (ref 65–99)
Potassium: 4.5 mmol/L (ref 3.5–5.1)
Sodium: 140 mmol/L (ref 135–145)

## 2016-03-13 LAB — CBC
HEMATOCRIT: 31.5 % — AB (ref 36.0–46.0)
Hemoglobin: 10.5 g/dL — ABNORMAL LOW (ref 12.0–15.0)
MCH: 29.4 pg (ref 26.0–34.0)
MCHC: 33.3 g/dL (ref 30.0–36.0)
MCV: 88.2 fL (ref 78.0–100.0)
PLATELETS: 417 10*3/uL — AB (ref 150–400)
RBC: 3.57 MIL/uL — ABNORMAL LOW (ref 3.87–5.11)
RDW: 12.8 % (ref 11.5–15.5)
WBC: 11.3 10*3/uL — AB (ref 4.0–10.5)

## 2016-03-13 MED ORDER — LIDOCAINE 5 % EX PTCH
1.0000 | MEDICATED_PATCH | CUTANEOUS | Status: DC
  Administered 2016-03-13 – 2016-03-14 (×2): 1 via TRANSDERMAL
  Filled 2016-03-13 (×2): qty 1

## 2016-03-13 MED ORDER — ENOXAPARIN (LOVENOX) PATIENT EDUCATION KIT
PACK | Freq: Once | Status: AC
  Administered 2016-03-13: 11:00:00
  Filled 2016-03-13: qty 1

## 2016-03-13 MED ORDER — HYDROMORPHONE HCL 2 MG/ML IJ SOLN
0.5000 mg | Freq: Once | INTRAMUSCULAR | Status: AC
  Administered 2016-03-13: 0.5 mg via INTRAVENOUS
  Filled 2016-03-13: qty 1

## 2016-03-13 NOTE — Progress Notes (Signed)
1 Day Post-Op Procedure(s) (LRB): HYSTERECTOMY ABDOMINAL TOTAL , OMENTECTOMY, RADICAL TUMOR DEBULKING (N/A) BILATERAL SALPINGO OOPHORECTOMY (Bilateral) EXPLORATORY LAPAROTOMY (N/A) APPENDECTOMY  Subjective: Patient reports feeling well this am.  Tolerated breakfast eating an omelette with no nausea or emesis reported.  Taking in adequate PO intake.  Pain minimal and controlled with PRN medications.  Has ambulated once in the halls with no difficulty.  Due to void.  Asking about getting her IV unhooked or out.  Denies chest pain, dyspnea, passing flatus, or having a bowel movement.  Friend at the bedside.  No concerns voiced.  Objective: Vital signs in last 24 hours: Temp:  [98 F (36.7 C)-99.6 F (37.6 C)] 99.6 F (37.6 C) (12/29 0511) Pulse Rate:  [87-105] 87 (12/29 0511) Resp:  [10-18] 14 (12/29 0511) BP: (108-139)/(75-92) 117/78 (12/29 0511) SpO2:  [97 %-100 %] 97 % (12/29 0511) Weight:  [136 lb 11.2 oz (62 kg)-147 lb (66.7 kg)] 136 lb 11.2 oz (62 kg) (12/29 0536) Last BM Date: 03/12/16  Intake/Output from previous day: 12/28 0701 - 12/29 0700 In: 4068.3 [P.O.:720; I.V.:3098.3; IV Piggyback:250] Out: 4200 [Urine:4075; Blood:125]  Physical Examination: General: alert, cooperative and no distress Resp: clear to auscultation bilaterally Cardio: regular rate and rhythm, S1, S2 normal, no murmur, click, rub or gallop GI: soft, non-tender; bowel sounds normal; no masses,  no organomegaly and incision: midline incision with honeycomb dressing in place with no active drainage or erythema noted Extremities: extremities normal, atraumatic, no cyanosis or edema  Labs: WBC/Hgb/Hct/Plts:  11.3/10.5/31.5/417 (12/29 0445) BUN/Cr/glu/ALT/AST/amyl/lip:  8/0.67/--/--/--/--/-- (12/29 0445)  Assessment: 55 y.o. s/p Procedure(s): HYSTERECTOMY ABDOMINAL TOTAL , OMENTECTOMY, RADICAL TUMOR DEBULKING BILATERAL SALPINGO OOPHORECTOMY EXPLORATORY LAPAROTOMY APPENDECTOMY: stable Pain:  Pain is  well-controlled on PRN medications.  Heme: Hgb 10.5 and Hct 31.5 this am.  Stable post-op.  CV: BP and HR stable post-operatively.  Continue to monitor with ordered vital sign assessments.  GI:  Tolerating po: Yes.  GU: Due to void since foley removal.    FEN: Stable post-operatively.  Prophylaxis: pharmacologic prophylaxis (with any of the following: enoxaparin (Lovenox) 40mg  SQ 2 hours prior to surgery then every day) and intermittent pneumatic compression boots.  Plan: Saline lock IV Diet as tolerated Encourage ambulation, IS use, deep breathing, and coughing Continue post-operative plan of care per Kim [name] When ready for discharge, the patient is to be discharged to home.   LOS: 1 day    Kim [name] DEAL 03/13/2016, 8:49 AM

## 2016-03-14 MED ORDER — FAMOTIDINE 20 MG PO TABS
20.0000 mg | ORAL_TABLET | Freq: Every day | ORAL | Status: DC | PRN
  Administered 2016-03-14: 20 mg via ORAL
  Filled 2016-03-14: qty 1

## 2016-03-14 NOTE — Progress Notes (Signed)
Patient with complaints of indigestion. RN notified on call MD and prn order obtained and administered.  Of not pt not passing gas post op, RN encouraged and educated pt on importance of walking to help return bowel function post-op.  Pt expressed understanding and stated that she will ambulate later in the day, feels tired and wants to rest right now.

## 2016-03-14 NOTE — Progress Notes (Signed)
2 Days Post-Op Procedure(s) (LRB): HYSTERECTOMY ABDOMINAL TOTAL , OMENTECTOMY, RADICAL TUMOR DEBULKING (N/A) BILATERAL SALPINGO OOPHORECTOMY (Bilateral) EXPLORATORY LAPAROTOMY (N/A) APPENDECTOMY  Subjective: Patient reports feeling well this am. Tolerating po/voiding without difficulty.  No flatus/BM. Objective: Vital signs in last 24 hours: Temp:  [98.3 F (36.8 C)-99 F (37.2 C)] 98.7 F (37.1 C) (12/30 1028) Pulse Rate:  [84-97] 89 (12/30 1028) Resp:  [16-18] 16 (12/30 1028) BP: (111-120)/(64-77) 112/68 (12/30 1028) SpO2:  [96 %-98 %] 97 % (12/30 1028) Last BM Date: 03/12/16  Intake/Output from previous day: 12/29 0701 - 12/30 0700 In: 730 [P.O.:480; I.V.:250] Out: 1850 [Urine:1550]  Physical Examination: General: alert, cooperative and no distress Resp: clear to auscultation bilaterally Cardio: regular rate and rhythm, S1, S2 normal, no murmur, click, rub or gallop GI: soft, non-tender; bowel sounds normal; no masses,  no organomegaly and incision: midline incision with honeycomb dressing in place with no active drainage or erythema noted Extremities: extremities normal, atraumatic, no cyanosis or edema  Labs:      Assessment: 55 y.o. s/p Procedure(s): HYSTERECTOMY ABDOMINAL TOTAL , OMENTECTOMY, RADICAL TUMOR DEBULKING BILATERAL SALPINGO OOPHORECTOMY EXPLORATORY LAPAROTOMY APPENDECTOMY: stable Pain:  Pain is well-controlled on PRN medications.  CV: BP stable.   GI:  Tolerating po: Yes.  GU: Voiding without difficulty  FEN: Stable post-operatively.  Prophylaxis: pharmacologic prophylaxis (with any of the following: enoxaparin (Lovenox) 40mg  SQ 2 hours prior to surgery then every day) and intermittent pneumatic compression boots.  Plan:  Diet as tolerated Encourage ambulation, chewing gum, IS use, deep breathing, and coughing    LOS: 2 days    JACKSON-MOORE,Coti Burd A 03/14/2016, 12:01 PM

## 2016-03-15 MED ORDER — ENOXAPARIN (LOVENOX) PATIENT EDUCATION KIT
PACK | Freq: Once | Status: AC
  Administered 2016-03-15: 12:00:00
  Filled 2016-03-15: qty 1

## 2016-03-15 MED ORDER — OXYCODONE-ACETAMINOPHEN 5-325 MG PO TABS: 1.0000 | ORAL_TABLET | ORAL | 0 refills | Status: DC | PRN

## 2016-03-15 MED ORDER — ENOXAPARIN SODIUM 40 MG/0.4ML ~~LOC~~ SOLN: 40.0000 mg | SUBCUTANEOUS | 0 refills | Status: DC

## 2016-03-15 NOTE — Progress Notes (Signed)
Patient discharged to home, all discharge mediations and instructions reviewed and questions answered.  Patient provided with lovenox take home kit.  RN reviewed process for injections administration.  Pt states she feels she can do this herself and has neighbor who is familiar with giving shots that can help her.

## 2016-03-15 NOTE — Discharge Summary (Signed)
Physician Discharge Summary  Patient ID: Tyran Nix MRN: [id number] DOB/AGE: [birth year] 57 y.o.  Admit date: 03/12/2016 Discharge date: 03/15/2016  Admission Diagnoses: Pelvic mass  Discharge Diagnoses:  Principal Problem:   Pelvic mass Active Problems:   Pelvic mass in female   Discharged Condition: good  Hospital Course: On 03/12/2016, the patient underwent the following: Procedure(s): HYSTERECTOMY ABDOMINAL TOTAL , OMENTECTOMY, RADICAL TUMOR DEBULKING BILATERAL SALPINGO OOPHORECTOMY EXPLORATORY LAPAROTOMY APPENDECTOMY.   The postoperative course was uneventful.  She was discharged to home on postoperative day 3 tolerating a regular diet.  Consults: None  Significant Diagnostic Studies: None  Treatments: surgery: see above  Discharge Exam: Blood pressure 126/81, pulse 98, temperature 98.4 F (36.9 C), temperature source Oral, resp. rate 18, height 5' 1.75" (1.568 m), weight 136 lb 11.2 oz (62 kg), SpO2 98 %. General appearance: alert Resp: clear to auscultation bilaterally Cardio: regular rate and rhythm, S1, S2 normal, no murmur, click, rub or gallop GI: soft, non-tender; bowel sounds normal; no masses,  no organomegaly Extremities: extremities normal, atraumatic, no cyanosis or edema Incision/Wound: C/D/I  Disposition: 01-Home or Self Care  Discharge Instructions    Activity as tolerated - No restrictions    Complete by:  As directed    Call MD for:  extreme fatigue    Complete by:  As directed    Call MD for:  persistant dizziness or light-headedness    Complete by:  As directed    Call MD for:  persistant nausea and vomiting    Complete by:  As directed    Call MD for:  redness, tenderness, or signs of infection (pain, swelling, redness, odor or green/yellow discharge around incision site)    Complete by:  As directed    Call MD for:  severe uncontrolled pain    Complete by:  As directed    Call MD for:  temperature >100.4    Complete by:  As  directed    Diet - low sodium heart healthy    Complete by:  As directed    Diet Carb Modified    Complete by:  As directed    Diet general    Complete by:  As directed    Discharge instructions    Complete by:  As directed    Return to work: 6 weeks  Activity: 1. Be up and out of the bed during the day.  Take a nap if needed.  You may walk up steps but be careful and use the hand rail.  Stair climbing will tire you more than you think, you may need to stop part way and rest.   2. No lifting or straining for 6 weeks.  3. No driving for 1-2 weeks.  Do Not drive if you are taking narcotic pain medicine.  4. Shower daily.  Use soap and water on your incision and pat dry; don't rub.   5. No sexual activity and nothing in the vagina for 4 weeks.  Diet: 1. Low sodium Heart Healthy Diet is recommended.  2. It is safe to use a laxative if you have difficulty moving your bowels.   Wound Care: 1. Keep clean and dry.  Shower daily.  Reasons to call the Doctor:  Fever - Oral temperature greater than 100.4 degrees Fahrenheit Foul-smelling vaginal discharge Difficulty urinating Nausea and vomiting Increased pain at the site of the incision that is unrelieved with pain medicine. Difficulty breathing with or without chest pain New calf pain especially if only on one  side Sudden, continuing increased vaginal bleeding with or without clots.   Follow-up: 1. See Everitt Amber in 4 weeks.  Contacts: For questions or concerns you should contact:  Dr. Everitt Amber at [phone]  or at [facility]anning for Recovery and [facility] [place] for You After Surgery In addition to the nursing staff on the unit, the gynecological surgery team will care for you. This team is led by your surgeon and includes a resident in his last year of training, as well as other residents, medical students and a physician  assistant or nurse practitioner. There will be a physician in the hospital 24 hours a day to tend to your needs. The residents and students report directly to your surgeon, who is the one overseeing all of your care.  Pain Relief After Surgery Your pain will be assessed regularly on a scale from 0 to 10. Pain assessment is  necessary to guide your pain relief. It is essential that you are able to take deep breaths, cough and move. Prevention or early treatment of pain is far more effective than trying to treat severe pain. Therefore, we have devised a specialized regimen to stay ahead of your pain and use almost no narcotics, which can slow down your recovery process. If you have an epidural catheter, you will receive a  constant infusion of pain medication through your epidural. If you need additional pain relief, you will be able to push a button to increase the medication in your epidural. You will also be given acetaminophen and an ibuprofen-like medication to keep your pain under control.  You can always ask for additional pain pills if you are not comfortable. In most cases an anesthesiologist with expertise in pain management will visit you every day and help design your pain management plan.  One Day After Surgery Focus on drinking and walking. You will start drinking clear liquids after surgery. The intravenous fluids will be stopped, and the catheter may be removed  from your bladder. We expect you to get out of bed, with the nurses' or assistants' help, sit in a chair for six hours and start to move about in the hallways. You will also meet with a case manager to assess your discharge needs, including home nursing. Your physician may order home care to assist with your transition home.  Home nursing visits, which are intermittent, help you get readjusted to home by teaching treatments, monitoring medications, and performing clinical assessment and reporting back to your physician.  Other services may include therapy and medical equipment; private duty services are also available. If you are going "home" to a different address upon discharge, please alert [place]. A Home Care Coordinator can visit with you while in the hospital to discuss your options. If you have questions please speak with your case manager. If you need rehabilitation at a facility, a social worker will assist with this. If you need rehabilitation at a facility, a social worker will assist with this. If your procedure was performed in a minimally invasive fashion, you will be discharged to home if your pain is well controlled and you are tolerating a regular diet.     Two Days After Surgery You will start eating a soft diet and change to a more solid diet as you feel up to it. The catheter from your bladder will be removed, if not already done so. If there is a dressing  on your wound, it will be removed. The tubing will be disconnected from your IV. We expect you to be out of bed for the majority of the day and walking at least three times in the hallway, with assistance as needed.  You may be discharged at this point if it is felt you are ready.   Three Days After Surgery You continue to eat your low residue diet. You may be ready to go home if you are drinking enough to keep yourself hydrated, your pain is well controlled, you are not belching or nauseated, you are passing gas and you are able to get around on your own. However, we will not discharge you from the hospital until we are sure you are ready.  Discharge Discharge time is at 10 a.m. You will need to make arrangements for someone to accompany you home. You will not be released without someone present. Please keep in mind that we strive to get patients discharged as quickly as possible, but there may be delays for a variety of reasons. Complications That May Delay Discharge: ? Nausea and vomiting: It is very common to feel sick after your  surgery. We give you medication to reduce this. However, if you do feel sick, you should reduce the amount you are taking by mouth. Small, frequent meals or drinks are best in this  situation. As long as you can drink and keep yourself hydrated, the nausea will likely pass.  Ileus: Following surgery, the bowel can be sluggish, making it difficult for food and gas to pass through the intestines. This is called an ileus. We have designed our care program to do everything possible to reduce the likelihood of an ileus. If you do develop an ileus, it usually only lasts two to three days. However, it may require a small tube down the nose to decompress the stomach. The best way to avoid an ileus is to reduce the amount of narcotic pain medications, get up as much as possible after your surgery, and stimulate the bowel early after surgery with small amounts of food and liquids.  Wound infection: If a wound infection develops, this usually happens three to ten days after surgery.    Urinary retention: This is if you are unable to urinate after the catheter from your bladder is removed. The catheter may need to be reinserted until you are able to urinate on your own. This can be caused by anesthesia, pain medication and decreased activity.    When you are preparing to go home, you will receive:  Detailed discharge instructions, with information about your operation and medications    All prescriptions for medications you need at home; prescriptions can be filled while you are in the hospital if you would like    You may be prescribed Lovenox. Lovenox is used to reduce the risk of developing a blood clot after surgery. An appointment to see your surgeon or provider one to two weeks after you leave the hospital for follow-up   After Discharge Once you are discharged: Call us at any time if you are worried about your recovery or if you should have any questions. During regular office hours,  (8:30 a.m.-4:30 p.m.), and after hours call [phone].  Call us immediately if:  You have a fever higher than 100.4 degrees.   Your wound is red, more painful or has drainage.    You are nauseated, vomiting or can't keep liquids down.    Your pain is worse  and not able to be controlled with the regimen you were sent home with.    If you are bleeding heavily or have a lot of fluid coming from your vagina. If you are on narcotics, the goal is to wean you off of them. If you are running low on supply and need more, call the nurse a few days before you will run out.  It is generally easier to reach someone between 8:30 a.m. - 4:30 p.m., so call early if you think something is not right. A nurse or nurse practitioner is available every day to answer your questions. After hours and on the weekends, the calls go to the resident doctors in the hospital. It may take longer for your phone call to be returned during this time. If you have a true emergency, such as severe abdominal pain, chest pain, shortness of breath or any other acute issues, call 911 and go to the local emergency room. Have them contact our team once you are stable.  Concerns After Discharge Bowel Function Following Your Surgery Your bowels will take several weeks to settle down and may be unpredictable at first. Your bowel movements may become loose, or you may be constipated. For the vast number of patients, this will get back to normal with time. Make sure you eat nutritious meals, drink plenty of fluids and take regular walks during the first two weeks after your operation. Your Guide to Gynecologic Surgery   Abdominal Pain It is not unusual to suffer gripping pains (colic) during the first week following removal of a portion of your bowel. This pain usually lasts for a few minutes but goes away between spasms. If you have severe pain lasting more than one to two hours or have a fever and feel generally unwell,  you should contact us at  the telephone contact numbers listed at the end of this packet. Hysterectomy: You should have pelvic rest for six (6) weeks or as specified by your doctor after surgery. You should have nothing in the vagina (no tampons, douching, intercourse, etc.,) during this time period. If you have some vaginal spotting, this is normal. If you have heavy bleeding or  a lot of fluid from your vagina, this is NOT normal and you should contact your doctor's office or, if after hours, contact the doctor on call.  Diarrhea: Fiber and Imodium (Loperamide) The first step to improving your frequent or loose stools is to bulk up the stool with fiber. Metamucil is the most common type of fiber that is available at any drug store. Start with 1 teaspoon mixed into food, like yogurt or oatmeal, in the morning and evening. Try not to drink any fluid for one hour after you take the fiber. This will allow the fiber to act like a sponge in your intestines, soaking up all the excess water. Continue this for three to five days. You may increase by 1 teaspoon every three to five days until the desired affect, or you are at 1 tablespoon (3 teaspoons) twice a day. If this doesn't work, you may try over-the-counter Loperamide, which is an antidiarrheal medication. You may take one tablet in the morning and evening or 30 minutes before you typically have diarrhea. You may take up to eight of these tablets daily. It is best to discuss this with [place] prior to using this medication. If you have continuous diarrhea and abdominal cramping, call [phone].  Foley Catheter Your surgeon may recommend you be discharged home  with a foley catheter (bladder catheter) for 1 to 2 weeks. Typically this recommendation will be made for patients undergoing surgery to the lower urinary tract. Before you leave the hospital, your nurse should outfit you with a clip on the inner thigh to secure the catheter to prevent  pulling as well as a small bag that can be easily worn on the upper leg under loose fitting pants and skirts. Your nurse will teach you how to exchange the large bag that typically comes with the catheter for the small bag. You may find it convenient to attach the small bag when active during the day and then the large bag when sleeping at night. If there is ever a point when you notice the catheter is not draining urine and youbegin to develop pain behind/above the pubic bone, you should report to the clinic or emergency room immediately as the catheter may be kinked or clogged. Kinking or clogging of the catheter prevents urine from draining from your bladder. Urine will quickly build up in the bladder and can cause severe pain as well as seriously disrupt healing if you have undergone surgery on the lower urinary tract. Additionally, pulling on the catheter can result in displacement of the balloon at the end of the catheter from inside of to outside  of the bladder. This also results in severe pain and can cause bleeding. For this reason, secure the catheter to the clip on your inner thigh at all times as the clip prevents against pulling.  Wound Care For the first few weeks following surgery, your wound may be slightly red and uncomfortable. You may shower and let the soapy water wash over your incision. Avoid soaking in the tub for one month following surgery or until the wound is well healed. It will take the wound several months to "soften." It is common to have bumpy areas in the wound near the belly  button and at the ends of the incision.  If you have staples, these should be removed when you are seen by your surgeon at the follow-up appointment. You may have a glue-like material on your incision. Do not pick at this. It will come off over time. It is the surgical glue used in surgery to close your incision. You also have sutures inside of you that will dissolve over  time  Post-Surgery Diet Attention to good nutrition after surgery is important to your recovery. If you had no dietary restrictions prior to the surgery, you will have no special dietary restrictions after the surgery. However, consuming enough protein, calories, vitamins and minerals is necessary to support healing. Some patients find their appetite is less than normal after surgery. In this case, frequent small meals throughout the day may help. It is not uncommon to lose 10 to 15 pounds after surgery. However, by the fourth to fifth week, your weight loss should stabilize. It is normal that certain foods taste different and certain smells may make you nauseas. Over time, the amount you can comfortably consume will gradually increase. You should try to eat a balanced diet, which includes:  Foods that are soft, moist, and easy to chew and swallow    Canned or soft-cooked fruits and vegetables   Plenty of soft breads, rice, pasta, potatoes and other starchy foods (lowerfiber  varieties may be tolerated better initially)    High-protein foods and beverages, such as meats, eggs, milk, cottage cheese  or a supplemental nutrition drink like Boost or Ensure  Drink plenty of fluids-at least 8 to 10 cups per day. This includes water,  fruit juice, Gatorade, teas/coffee and milk. Drinking plenty is especially important if you have loose stools (diarrhea).   Avoid drinking a lot of caffeine, since this may dehydrate you.    Avoid fried, greasy and highly seasoned or spicy foods.    Avoid carbonated beverages in the first couple weeks.    Avoid raw fruits and vegetables.   Hobbies/Activities Walking is encouraged after your surgery. You should plan to undertake regular exercise several times a day and gradually increase this during the four weeks following your operation until you are back to your normal level of activity. You may climb stairs. Don't do any heavy lifting greater than  10 pounds or contact sports for the first month after your surgery. Generally, you can return to hobbies and activities soon after your surgery. This will help you recover. It can take up to two to three months to fully recover. It is not unusual to be fatigued and require an afternoon nap for up to six to eight weeks following surgery. Your body is using this energy to heal your wounds. Set small goals for yourself and try to do a little more each day.  Work It is normal to return to work three to six weeks following your operation. If your job involves heavy manual work, then you should wait six weeks. However, you should check with your employer regarding rules, which may be relevant to your return to work. If you need a return-to-work form for your employer or disability papers, bring them to your follow-up appointment or fax them to our office at [phone].  Driving You may drive when you are off narcotics and pain-free enough to react quickly with your braking foot. For most patients, this occurs at one to four weeks following surgery.   Write down any questions you may have to ask your care team.  Important Contact Numbers: GYN Oncology Office: [phone]   Discharge wound care:    Complete by:  As directed    Keep clean and dry   Driving Restrictions    Complete by:  As directed    No driving for 1- 2 weeks   Increase activity slowly    Complete by:  As directed    Lifting restrictions    Complete by:  As directed    No lifting > 5 lbs for 6 weeks   May shower / Bathe    Complete by:  As directed    No tub baths for 6 weeks   Sexual Activity Restrictions    Complete by:  As directed    No intercourse for 6 - 8 weeks     Allergies as of 03/15/2016      Reactions   Morphine And Related Nausea And Vomiting   Penicillins    Childhood reaction Has patient had a PCN reaction causing immediate rash, facial/tongue/throat swelling, SOB or lightheadedness with  hypotension:unsure Has patient had a PCN reaction causing severe rash involving mucus membranes or skin necrosis:unsure Has patient had a PCN reaction that required hospitalization:No Has patient had a PCN reaction occurring within the last 10 years: No If all of the above answers are "NO", then may proceed with Cephalosporin use.      Medication List    STOP taking these medications   ondansetron 4 MG disintegrating tablet Commonly known as:  ZOFRAN ODT     TAKE these medications  aspirin-acetaminophen-caffeine [id number] MG tablet Commonly known as:  EXCEDRIN MIGRAINE Take 2 tablets by mouth daily as needed for headache or migraine.   atorvastatin 20 MG tablet Commonly known as:  LIPITOR Take 20 mg by mouth at bedtime.   buPROPion 300 MG 24 hr tablet Commonly known as:  WELLBUTRIN XL Take 300 mg by mouth every morning.   desvenlafaxine 50 MG 24 hr tablet Commonly known as:  PRISTIQ Take 50 mg by mouth every morning.   enoxaparin 40 MG/0.4ML injection Commonly known as:  LOVENOX Inject 0.4 mLs (40 mg total) into the skin daily. Start taking on:  03/16/2016   lamoTRIgine 200 MG tablet Commonly known as:  LAMICTAL Take 200 mg by mouth at bedtime.   oxyCODONE-acetaminophen 5-325 MG tablet Commonly known as:  PERCOCET/ROXICET Take 1-2 tablets by mouth every 4 (four) hours as needed for severe pain. What changed:  how much to take   senna-docusate 8.6-50 MG tablet Commonly known as:  Senokot-S Take 2 tablets by mouth at bedtime. For constipation        Signed: JACKSON-MOORE,Viera Okonski A 03/15/2016, 11:34 AM

## 2016-03-18 ENCOUNTER — Telehealth: Payer: Self-pay | Admitting: Gynecologic Oncology

## 2016-03-18 ENCOUNTER — Encounter: Payer: Self-pay | Admitting: Nurse Practitioner

## 2016-03-18 NOTE — Telephone Encounter (Signed)
Spoke with patient regarding pathology. She's overall doing quite well. She had some constipation and stop taking her narcotic pain medication and drinks some prune juice yesterday with good results. She is aware of her diagnosis and the need to refer to [facility] for further evaluation and treatment. She knows that she will be contacted directly by them that we will contact her with the date and time. She is aware that that is not something that our service treat and manage his.  She has requested a copy of her pathology report. We will put that in the mail to her.  Her questions were elicited in answer to her satisfaction. PG

## 2016-03-20 ENCOUNTER — Encounter: Payer: Self-pay | Admitting: Gynecologic Oncology

## 2016-03-20 ENCOUNTER — Telehealth: Payer: Self-pay | Admitting: *Deleted

## 2016-03-20 NOTE — Telephone Encounter (Signed)
Called and informed pt I was calling as Dr. Epifanio Lesches office had advised they were unable to get in touch with her.  Pt thanked me for the call regarding the appt with Dr. Clovis Riley, pt states " I have already spoke with them this morning. I know all about the appt on 1/10."  No further concerns at this time.

## 2016-03-20 NOTE — Progress Notes (Signed)
Patient has an appt with Dr. Clovis Riley at [facility] on Jan 10 at 9 am.  Spoke with Margreta Journey about this appt

## 2016-04-01 ENCOUNTER — Ambulatory Visit: Payer: Self-pay | Admitting: Gynecologic Oncology

## 2016-04-05 NOTE — Progress Notes (Deleted)
GYN oncology Follow-up.  Kim [name] 56 y.o. female  CC:  No chief complaint on file.   HPI: Patient is seen today in consultation at the request of Dr. Duffy Bruce in the emergency room. Primary physician Dr. Elbert Ewings.  Patient is a 56 year old gravida 1 para 0 aborta 1 who is not sure when she was last menopausal. She underwent an endometrial ablation in 2005 and his had no bleeding since that time. She occasionally still has some hot flashes but states that her estrogen levels are "okay".  Approximately 2 months ago she felt that she was starting to gain weight was having increasing constipation and indigestion. She also started noticing abdominal swelling, bloating, increased flatulence and developed over time early satiety. She states she looked online for number of different diagnoses and she Putting this off. She really didn't have much pain with occasional twinges with the exception of one Saturday 2 weeks ago which time she woke up with severe abdominal pain. This past Friday she saw her primary care physician who recommended that she go to the emergency room. In the emergency room she was noted to have a distended abdomen. CT scan was performed that revealed:  FINDINGS: Lower chest: Mild atelectasis at both lung bases. There is no significant pleural or pericardial effusion. There is a moderate size hiatal hernia.  Hepatobiliary: The liver is normal in density without focal abnormality. No evidence of gallstones, gallbladder wall thickening or biliary dilatation.  Pancreas: Unremarkable. No pancreatic ductal dilatation or surrounding inflammatory changes.  Spleen: Normal in size without focal abnormality.  Adrenals/Urinary Tract: Both adrenal glands appear normal. The kidneys appear normal without evidence of urinary tract calculus, suspicious lesion or hydronephrosis. No bladder abnormalities are seen.  Stomach/Bowel: No evidence of bowel wall thickening,  distention or surrounding inflammatory change. There is peripheral displacement of the bowel by a large cystic pelvic mass, further described below. The appendix is not well visualized, although is in questionable contact with the large cystic pelvic mass.  Vascular/Lymphatic: There are no enlarged abdominal or pelvic lymph nodes. No acute vascular findings are seen. The IVC is mildly compressed at the level of the pelvic brim by the large pelvic mass.  Reproductive: The uterus is posteriorly displaced. No definite normal ovarian tissue is seen. There is a very large complex cystic pelvic mass, extending superiorly above the umbilicus. This measures up to 26.0 x 25.7 x 13.7 cm. There are solid components along the periphery of this lesion, especially posteriorly. There are also thickened septations.  Other: There is a small amount of pelvic ascites. There is some soft tissue stranding in the omentum which could be secondary to vascular congestion, although is worrisome for peritoneal carcinomatosis.  IMPRESSION: 1. 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 No normal ovarian tissue identified. 2. The mass contacts the appendix. 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 3. Small amount of ascites with soft tissue stranding in the omentum worrisome for carcinomatosis. 4. No adenopathy, solid visceral organ metastasis or hydronephrosis. 5. Moderate size hiatal hernia.  She is up-to-date on her mammograms having had one in April of this year. Her colonoscopies are up-to-date having had one in 2010, with 10 year follow-up recommended. She had a Pap smear earlier this year that was normal. She does have a prior history of cocaine use the last time she used was 2 years ago. She's Hep C negative. She's never used any IV drugs. She  does not use any alcohol.  CA 125 was *** on ***. CEA was *** on ***.  On *** she underwent  exploratory laparotomy, TAH, BSO, appendectomy, omentectomy. Intraoperative findings were significant for a large volume of mucinous ascites within the peritoneal cavity. Firm nodule at tip of appendix errupting mucin through to peritoneal cavity. 30cm left ovarian mass filling abdominal and pelvic space. Grossly normal right ovary. 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 At the completion of the case there was no gross macroscopic disease appreciated.   Due to the non-gynecologic primary identified on final pathology, the patient was referred to appendiceal cancer expert, Dr ***.   Review of Systems  Constitutional:   Denies fever. Skin: No rash, sores, jaundice, itching, or dryness.  Cardiovascular: No chest pain, shortness of breath, or edema  Pulmonary: No cough or wheeze.  Gastro Intestinal: Reporting intermittent lower abdominal soreness.  Occ nausea, -vomiting, +constipation, - diarrhea reported.  Genitourinary: No frequency, urgency, or dysuria.  Denies vaginal bleeding and discharge.  Musculoskeletal: No joint swelling or pain.  Neurologic: No weakness Psychology: Long history of psychiatric disease, prior history of suicidal ideation but none in 2 years since she stopped illicit drug use.   Current Meds:  Outpatient Encounter Prescriptions as of 04/06/2016  Medication Sig  . aspirin-acetaminophen-caffeine (EXCEDRIN MIGRAINE) 250-250-65 MG tablet Take 2 tablets by mouth daily as needed for headache or migraine.  Marland Kitchen atorvastatin (LIPITOR) 20 MG tablet Take 20 mg by mouth at bedtime.  Marland Kitchen buPROPion (WELLBUTRIN XL) 300 MG 24 hr tablet Take 300 mg by mouth every morning.  . desvenlafaxine (PRISTIQ) 50 MG 24 hr tablet Take 50 mg by mouth every morning.  . enoxaparin (LOVENOX) 40 MG/0.4ML injection Inject 0.4 mLs (40 mg total) into the skin daily.  Marland Kitchen lamoTRIgine (LAMICTAL) 200 MG tablet Take 200 mg by mouth at bedtime.  Marland Kitchen  oxyCODONE-acetaminophen (PERCOCET/ROXICET) 5-325 MG tablet Take 1-2 tablets by mouth every 4 (four) hours as needed for severe pain.  Marland Kitchen senna-docusate (SENOKOT-S) 8.6-50 MG tablet Take 2 tablets by mouth at bedtime. For constipation   No facility-administered encounter medications on file as of 04/06/2016.     Allergy:  Allergies  Allergen Reactions  . Morphine And Related Nausea And Vomiting  . Penicillins     Childhood reaction Has patient had a PCN reaction causing immediate rash, facial/tongue/throat swelling, SOB or lightheadedness with hypotension:unsure Has patient had a PCN reaction causing severe rash involving mucus membranes or skin necrosis:unsure Has patient had a PCN reaction that required hospitalization:No Has patient had a PCN reaction occurring within the last 10 years: No If all of the above answers are "NO", then may proceed with Cephalosporin use.     Social Hx:   Social History   Social History  . Marital status: Divorced    Spouse name: N/A  . Number of children: N/A  . Years of education: N/A   Occupational History  . Not on file.   Social History Main Topics  . Smoking status: Never Smoker  . Smokeless tobacco: Never Used  . 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 Drug use: Yes    Types: Cocaine  . Sexual activity: No   Other Topics Concern  . Not on file   Social History Narrative  . No narrative on file    Past Surgical Hx:  Past Surgical History:  Procedure Laterality Date  . ABDOMINAL HYSTERECTOMY N/A 03/12/2016   Procedure: HYSTERECTOMY ABDOMINAL TOTAL , OMENTECTOMY, RADICAL TUMOR DEBULKING;  Surgeon: Everitt Amber, MD;  Location: [facility];  Service: Gynecology;  Laterality: N/A;  . APPENDECTOMY  03/12/2016   Procedure: APPENDECTOMY;  Surgeon: Everitt Amber, MD;  Location: [facility];  Service: Gynecology;;  . FOOT SURGERY Right    correction old fracture last toe  . FRACTURE SURGERY Right    patellar  fracture  . KNEE SURGERY     right  . LAPAROTOMY N/A 03/12/2016   Procedure: EXPLORATORY LAPAROTOMY;  Surgeon: Everitt Amber, MD;  Location: [facility];  Service: Gynecology;  Laterality: N/A;  . MOUTH SURGERY    . SALPINGOOPHORECTOMY Bilateral 03/12/2016   Procedure: BILATERAL SALPINGO OOPHORECTOMY;  Surgeon: Everitt Amber, MD;  Location: [facility];  Service: Gynecology;  Laterality: Bilateral;  . tummy tuck    . uterine ablation      Past Medical Hx:  Past Medical History:  Diagnosis Date  . ADHD (attention deficit hyperactivity disorder)   . Bipolar disorder ([place])   . Depression   . GERD (gastroesophageal reflux disease)   . Headache(784.0)    migraines  . Herpes 1986   on patients back side since 1986  . History of hiatal hernia     Oncology Hx:   No history exists.    Family Hx:  Family History  Problem Relation Age of Onset  . Cancer Mother     pancreatic    Vitals:  There were no vitals taken for this visit.  Physical Exam:  Well-nourished well-developed female in no acute distress.  Neck: Supple, no lymphadenopathy, no thyromegaly.  Lungs: Clear to auscultation bilaterally.  Cardiac: Regular rate and rhythm.  Abdomen: Well-healed vertical midline incision There is no rebound or guarding.  Groins: No lymphadenopathy.  Extremities: No edema.  Pelvic: External genitalia within normal limits. Vagina is markedly atrophic. Vaginal cuff ***.  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 She will follow-up with Dr Marland Kitchen for ongoing care. She appears to be healing well from her first surgery with me.  I will not schedule future follow-up with me given the non-gyn nature of this tumor.  Donaciano Eva, MD 04/05/2016, 11:06 PM

## 2016-04-06 ENCOUNTER — Ambulatory Visit: Payer: Self-pay | Admitting: Gynecologic Oncology

## 2016-04-10 ENCOUNTER — Telehealth: Payer: Self-pay | Admitting: Nurse Practitioner

## 2016-04-10 ENCOUNTER — Telehealth: Payer: Self-pay | Admitting: *Deleted

## 2016-04-10 NOTE — Telephone Encounter (Signed)
Pt reports swelling on her right side/ abd and also increase in pain.  She asks if she can be seen today and if she should go ahead and come in? Instructed pt to not come in yet. I need to check to see if anyone can see her in the office today.  Please wait for return call.

## 2016-04-10 NOTE — Telephone Encounter (Signed)
Patient calling to report right lower abd swelling and pain. She reports increasing her activity lately, and wonders if this could be the cause. She is having regular bowel movements, last BM this morning. Pain is RLQ 2/10 on numeric scale, controlled with alternating ibuprofen and tylenol. Informed her to alternate using ice and heat in this area as well. She reports having a f/u apt this week with Dr. Denman George that she missed, next available apt made with Dr. Denman George on 2/21 at 3 pm. There is an opening on 2/5 but patient will be out of town. We will attempt to reach her if any cancellations occur in the meantime per her request. She is encouraged to call with any new or concerning symptoms, such as new or uncontrolled pain, constipation, vaginal bleeding, or signs of infection. She verbalizes understanding.

## 2016-04-10 NOTE — Telephone Encounter (Signed)
Patient left voicemail on clinic phone stating she has lower right side abdominal pain and what she feels is swelling in that area. No answer when RN attempted to reach patient for further information of her symptoms. Left message to call back for her needs.

## 2016-05-04 ENCOUNTER — Telehealth: Payer: Self-pay | Admitting: Gynecologic Oncology

## 2016-05-04 NOTE — Telephone Encounter (Signed)
Patient called and moved f/u from 2/21 to 3/12. Patient has new date/time.

## 2016-05-06 ENCOUNTER — Ambulatory Visit: Payer: Self-pay | Admitting: Gynecologic Oncology

## 2016-05-11 ENCOUNTER — Encounter: Payer: Self-pay | Admitting: General Practice

## 2016-05-11 NOTE — Progress Notes (Signed)
[facility] Spiritual Care Note  Met with Kim [name] for 1-hour appt for spiritual support.  She is looking for resources to nurture her spiritual growth that will complement the layers of support that she already has in place, including a therapist (per pt, appts weekly or every other week), 12-step community and sponsor, Kim [name] community/services, and friends.    In this encounter Kim [name] was upbeat with rapid speech and wandering/disjointed thoughts. She reports recent struggles with depression, compounded by SAD.    In this encounter we focused on building rapport, identifying strengths and supports, and exploring her motivation to continue to work on physical/emotional/spiritual/recovery healing in order to be best poised for her upcoming surgery.  Kim [name] verbalized her conviction that emotional and spiritual wellness/sobriety shape her physical wellbeing.  Provided empathic listening, redirection and focus on present goals, assistance with pastoral reflection, and programming resources.  She is interested in healing garden and healing arts programs as ways to deepen her spirituality and personal reflection.  We plan a f/u appt on Wednesday 3/7 at noon.   [place], [place], [facility] Pager [phone] Voicemail [phone]

## 2016-05-14 ENCOUNTER — Encounter: Payer: Self-pay | Admitting: General Practice

## 2016-05-14 NOTE — Progress Notes (Signed)
[facility] Spiritual Care Note  Rescheduled for Monday 3/12 at noon.   [place], [place], [facility] Pager [phone] Voicemail [phone]

## 2016-05-25 ENCOUNTER — Ambulatory Visit: Payer: Self-pay | Attending: Gynecologic Oncology | Admitting: Gynecologic Oncology

## 2016-05-25 ENCOUNTER — Encounter: Payer: Self-pay | Admitting: Gynecologic Oncology

## 2016-05-25 ENCOUNTER — Encounter: Payer: Self-pay | Admitting: General Practice

## 2016-05-25 VITALS — BP 126/88 | HR 109 | Temp 98.0°F | Resp 18 | Wt 140.2 lb

## 2016-05-25 DIAGNOSIS — Z90710 Acquired absence of both cervix and uterus: Secondary | ICD-10-CM | POA: Insufficient documentation

## 2016-05-25 DIAGNOSIS — Z7982 Long term (current) use of aspirin: Secondary | ICD-10-CM | POA: Insufficient documentation

## 2016-05-25 DIAGNOSIS — Z9889 Other specified postprocedural states: Secondary | ICD-10-CM | POA: Insufficient documentation

## 2016-05-25 DIAGNOSIS — K219 Gastro-esophageal reflux disease without esophagitis: Secondary | ICD-10-CM | POA: Insufficient documentation

## 2016-05-25 DIAGNOSIS — Z90722 Acquired absence of ovaries, bilateral: Secondary | ICD-10-CM | POA: Insufficient documentation

## 2016-05-25 DIAGNOSIS — C181 Malignant neoplasm of appendix: Secondary | ICD-10-CM | POA: Insufficient documentation

## 2016-05-25 DIAGNOSIS — R1900 Intra-abdominal and pelvic swelling, mass and lump, unspecified site: Secondary | ICD-10-CM

## 2016-05-25 DIAGNOSIS — Z8619 Personal history of other infectious and parasitic diseases: Secondary | ICD-10-CM | POA: Insufficient documentation

## 2016-05-25 DIAGNOSIS — G43909 Migraine, unspecified, not intractable, without status migrainosus: Secondary | ICD-10-CM | POA: Insufficient documentation

## 2016-05-25 DIAGNOSIS — Z885 Allergy status to narcotic agent status: Secondary | ICD-10-CM | POA: Insufficient documentation

## 2016-05-25 DIAGNOSIS — F909 Attention-deficit hyperactivity disorder, unspecified type: Secondary | ICD-10-CM | POA: Insufficient documentation

## 2016-05-25 DIAGNOSIS — F319 Bipolar disorder, unspecified: Secondary | ICD-10-CM | POA: Insufficient documentation

## 2016-05-25 DIAGNOSIS — C562 Malignant neoplasm of left ovary: Secondary | ICD-10-CM | POA: Insufficient documentation

## 2016-05-25 DIAGNOSIS — Z880 Allergy status to penicillin: Secondary | ICD-10-CM | POA: Insufficient documentation

## 2016-05-25 DIAGNOSIS — C7962 Secondary malignant neoplasm of left ovary: Secondary | ICD-10-CM

## 2016-05-25 NOTE — Progress Notes (Signed)
GYN oncology Follow-up visit.  Kim [name] 56 y.o. female  CC:  Chief Complaint  Patient presents with  . 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 S/p ex lap, TAH, BSO, omentectomy and radical tumor debulking on 03/12/16.  Healing well from surgery. Discussed operative findings. Discussed plan that has been made with Dr Clovis Riley. Discussed the nature of stage IV disease and its implications.  Plan - ongoing care and interventions as planned with Dr Clovis Riley (plan is for interval debulking surgery in April/May after restaging CT in April). Plan is for HIPEC. HPI: Patient was seen today in consultation at the request of Dr. Duffy Bruce in the emergency room. Primary physician Dr. Elbert Ewings.  Patient is a 56 year old gravida 1 para 0 aborta 1 who is not sure when she was last menopausal. She underwent an endometrial ablation in 2005 and his had no bleeding since that time. She occasionally still has some hot flashes but states that her estrogen levels are "okay".  In October, 2017 she felt that she was starting to gain weight was having increasing constipation and indigestion. She also started noticing abdominal swelling, bloating, increased flatulence and developed over time early satiety. She states she looked online for number of different diagnoses and she Putting this off. She really didn't have much pain with occasional twinges with the exception of one Saturday 2 weeks ago which time she woke up with severe abdominal pain. She saw her primary care physician who recommended that she go to the emergency room. In the emergency room she was noted to have a distended abdomen. CT scan was performed that revealed:  FINDINGS: Lower chest: Mild atelectasis at both lung bases. There is no significant pleural or pericardial effusion. There is a moderate size hiatal hernia.  Hepatobiliary: The liver is normal in density without  focal abnormality. No evidence of gallstones, gallbladder wall thickening or biliary dilatation.  Pancreas: Unremarkable. No pancreatic ductal dilatation or surrounding inflammatory changes.  Spleen: Normal in size without focal abnormality.  Adrenals/Urinary Tract: Both adrenal glands appear normal. The kidneys appear normal without evidence of urinary tract calculus, suspicious lesion or hydronephrosis. No bladder abnormalities are seen.  Stomach/Bowel: No evidence of bowel wall thickening, distention or surrounding inflammatory change. There is peripheral displacement of the bowel by a large cystic pelvic mass, further described below. The appendix is not well visualized, although is in questionable contact with the large cystic pelvic mass.  Vascular/Lymphatic: There are no enlarged abdominal or pelvic lymph nodes. No acute vascular findings are seen. The IVC is mildly compressed at the level of the pelvic brim by the large pelvic mass.  Reproductive: The uterus is posteriorly displaced. No definite normal ovarian tissue is seen. There is a very large complex cystic pelvic mass, extending superiorly above the umbilicus. This measures up to 26.0 x 25.7 x 13.7 cm. There are solid components along the periphery of this lesion, especially posteriorly. There are also thickened septations.  Other: There is a small amount of pelvic ascites. There is some soft tissue stranding in the omentum which could be secondary to vascular congestion, although is worrisome for peritoneal carcinomatosis.  IMPRESSION: 1. 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 No normal ovarian tissue identified. 2. The mass contacts the appendix. 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 3. Small amount of ascites with soft tissue stranding in the omentum worrisome for carcinomatosis. 4.  No adenopathy, solid visceral organ metastasis or  hydronephrosis. 5. Moderate size hiatal hernia.  She is up-to-date on her mammograms having had one in April of this year. Her colonoscopies are up-to-date having had one in 2010, with 10 year follow-up recommended.   Preoperative tumor markers on 03/04/16 were a normal  CA 125 of 30 and an elevated CEA of 13.  Interval Hx:  On 03/12/16 she underwent ex lap, TAH, BSO, omentectomy and radical debulking surgery. Intraoperative findings were significant for a large volume of thick mucinous fluid filling the peritoneal cavity (consistent with pseudomyxoma peritoneii) and a 30cm mucin filled mass that arose from the left ovary( rutpured during extrication). There was a nodular and thickened appendix with mucin erupting from the tip.The small and large intestines were otherwise normal. The uterus and right ovary were normal. There was nodularity in the left ovarian fossa consistent with a drop metastases. This was resected with the uterine specimen. There was gross residual visible or palpable tumor in the peritoneal cavity at the completion of the surgery.   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 There was acellular mucin in the omentum and on the surface of the uterus.   She did well postop with no surgical related complications.  She missed her initial postop check with me.  She was referred to Dr Pecolia Ades at [facility] and is planned to have a CT restaging in April, 2018 followed by cytoreductive surgery and HIPEC. She has had a colonoscopy earlier this year in 2018 which showed no other colonic malignancies.  Review of Systems  Constitutional:   Denies fever. Skin: No rash, sores, jaundice, itching, or dryness.  Cardiovascular: No chest pain, shortness of breath, or edema  Pulmonary: No cough or wheeze.  Gastro Intestinal: Reporting intermittent lower abdominal soreness.  Occ nausea, -vomiting, no constipation, -  diarrhea reported.  Genitourinary: No frequency, urgency, or dysuria.  Denies vaginal bleeding and discharge.  Musculoskeletal: No joint swelling or pain.  Neurologic: No weakness Psychology: Long history of psychiatric disease, prior history of suicidal ideation but none in 2 years since she stopped illicit drug use.   Current Meds:  Outpatient Encounter Prescriptions as of 05/25/2016  Medication Sig  . aspirin-acetaminophen-caffeine (EXCEDRIN MIGRAINE) 250-250-65 MG tablet Take 2 tablets by mouth daily as needed for headache or migraine.  Marland Kitchen atorvastatin (LIPITOR) 20 MG tablet Take 20 mg by mouth at bedtime.  Marland Kitchen buPROPion (WELLBUTRIN XL) 300 MG 24 hr tablet Take 300 mg by mouth every morning.  . desvenlafaxine (PRISTIQ) 50 MG 24 hr tablet Take 50 mg by mouth every morning.  . enoxaparin (LOVENOX) 40 MG/0.4ML injection Inject 0.4 mLs (40 mg total) into the skin daily.  Marland Kitchen lamoTRIgine (LAMICTAL) 200 MG tablet Take 200 mg by mouth at bedtime.  Marland Kitchen oxyCODONE-acetaminophen (PERCOCET/ROXICET) 5-325 MG tablet Take 1-2 tablets by mouth every 4 (four) hours as needed for severe pain.  Marland Kitchen senna-docusate (SENOKOT-S) 8.6-50 MG tablet Take 2 tablets by mouth at bedtime. For constipation   No facility-administered encounter medications on file as of 05/25/2016.     Allergy:  Allergies  Allergen Reactions  . Morphine And Related Nausea And Vomiting  . Penicillins     Childhood reaction Has patient had a PCN reaction causing immediate rash, facial/tongue/throat swelling, SOB or lightheadedness with hypotension:unsure Has patient had a PCN reaction causing severe rash involving mucus membranes or skin necrosis:unsure Has patient had a PCN reaction that  required hospitalization:No Has patient had a PCN reaction occurring within the last 10 years: No If all of the above answers are "NO", then may proceed with Cephalosporin use.     Social Hx:   Social History   Social History  . Marital status:  Divorced    Spouse name: N/A  . Number of children: N/A  . Years of education: N/A   Occupational History  . Not on file.   Social History Main Topics  . Smoking status: Never Smoker  . Smokeless tobacco: Never Used  . 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 Drug use: Yes    Types: Cocaine  . Sexual activity: No   Other Topics Concern  . Not on file   Social History Narrative  . No narrative on file    Past Surgical Hx:  Past Surgical History:  Procedure Laterality Date  . ABDOMINAL HYSTERECTOMY N/A 03/12/2016   Procedure: HYSTERECTOMY ABDOMINAL TOTAL , OMENTECTOMY, RADICAL TUMOR DEBULKING;  Surgeon: Everitt Amber, MD;  Location: [facility];  Service: Gynecology;  Laterality: N/A;  . APPENDECTOMY  03/12/2016   Procedure: APPENDECTOMY;  Surgeon: Everitt Amber, MD;  Location: [facility];  Service: Gynecology;;  . FOOT SURGERY Right    correction old fracture last toe  . FRACTURE SURGERY Right    patellar fracture  . KNEE SURGERY     right  . LAPAROTOMY N/A 03/12/2016   Procedure: EXPLORATORY LAPAROTOMY;  Surgeon: Everitt Amber, MD;  Location: [facility];  Service: Gynecology;  Laterality: N/A;  . MOUTH SURGERY    . SALPINGOOPHORECTOMY Bilateral 03/12/2016   Procedure: BILATERAL SALPINGO OOPHORECTOMY;  Surgeon: Everitt Amber, MD;  Location: [facility];  Service: Gynecology;  Laterality: Bilateral;  . tummy tuck    . uterine ablation      Past Medical Hx:  Past Medical History:  Diagnosis Date  . ADHD (attention deficit hyperactivity disorder)   . Bipolar disorder ([place])   . Depression   . GERD (gastroesophageal reflux disease)   . Headache(784.0)    migraines  . Herpes 1986   on patients back side since 1986  . History of hiatal hernia     Oncology Hx:   No history exists.    Family Hx:  Family History  Problem Relation Age of Onset  . Cancer Mother     pancreatic    Vitals:  Blood pressure 126/88, pulse (!) 109, temperature 98 F (36.7  C), temperature source Oral, resp. rate 18, weight 140 lb 3.2 oz (63.6 kg).  Physical Exam:  Well-nourished well-developed female in no acute distress.  Neck: Supple, no lymphadenopathy, no thyromegaly.  Lungs: Clear to auscultation bilaterally.  Cardiac: Regular rate and rhythm.  Abdomen: Well-healed vertical midline incision, no hernias. There is no rebound or guarding.  Groins: No lymphadenopathy.  Extremities: No edema.  Pelvic: External genitalia within normal limits. Vagina is markedly atrophic. Vaginal cuff in tact and healing normally. No palpable pelvic masses.    20 minutes of direct face to face counseling time was spent with the patient. This included discussion about prognosis, therapy recommendations and postoperative side effects and are beyond the scope of routine postoperative care.   Donaciano Eva, MD 05/25/2016, 11:17 AM

## 2016-05-25 NOTE — Progress Notes (Signed)
[facility] Spiritual Care Note  Met with Kim [name] briefly before her appt with Dr Denman George.  She was relieved to report good results from her colonoscopy on 3/5.  Due to cold sx and snow, pt requests to cancel our 12:00 appt; she plans to call later to reschedule.  [place], [place], [facility] Pager [phone] Voicemail [phone]

## 2016-05-25 NOTE — Patient Instructions (Signed)
Dr. Denman George will send her office note to Dr. Clovis Riley. No further follow up needed with Dr. Denman George.

## 2016-06-17 DIAGNOSIS — C181 Malignant neoplasm of appendix: Secondary | ICD-10-CM | POA: Insufficient documentation

## 2016-08-12 DIAGNOSIS — C786 Secondary malignant neoplasm of retroperitoneum and peritoneum: Secondary | ICD-10-CM | POA: Insufficient documentation

## 2016-10-07 ENCOUNTER — Other Ambulatory Visit: Payer: Self-pay | Admitting: Obstetrics and Gynecology

## 2016-10-07 DIAGNOSIS — Z1231 Encounter for screening mammogram for malignant neoplasm of breast: Secondary | ICD-10-CM

## 2016-10-22 ENCOUNTER — Ambulatory Visit (HOSPITAL_COMMUNITY): Payer: Self-pay

## 2016-12-23 ENCOUNTER — Telehealth (HOSPITAL_COMMUNITY): Payer: Self-pay | Admitting: *Deleted

## 2016-12-23 NOTE — Telephone Encounter (Signed)
Telephoned patient at home number and left message to return call to [facility]. Trying to reschedule missed appointment.

## 2017-01-28 ENCOUNTER — Ambulatory Visit (HOSPITAL_COMMUNITY): Payer: Self-pay

## 2017-04-01 ENCOUNTER — Ambulatory Visit (HOSPITAL_COMMUNITY): Payer: Self-pay

## 2017-07-27 ENCOUNTER — Ambulatory Visit (HOSPITAL_COMMUNITY): Payer: Self-pay

## 2017-10-07 ENCOUNTER — Ambulatory Visit (HOSPITAL_COMMUNITY)
Admission: RE | Admit: 2017-10-07 | Discharge: 2017-10-07 | Disposition: A | Discharge status: Home / Self Care | Payer: Self-pay | Source: Ambulatory Visit | Attending: Obstetrics and Gynecology | Admitting: Obstetrics and Gynecology

## 2017-10-07 ENCOUNTER — Ambulatory Visit
Admission: RE | Admit: 2017-10-07 | Discharge: 2017-10-07 | Disposition: A | Discharge status: Home / Self Care | Payer: No Typology Code available for payment source | Source: Ambulatory Visit | Attending: Obstetrics and Gynecology | Admitting: Obstetrics and Gynecology

## 2017-10-07 ENCOUNTER — Encounter (HOSPITAL_COMMUNITY): Payer: Self-pay | Admitting: *Deleted

## 2017-10-07 VITALS — BP 110/68 | Ht 62.0 in

## 2017-10-07 DIAGNOSIS — Z1231 Encounter for screening mammogram for malignant neoplasm of breast: Secondary | ICD-10-CM

## 2017-10-07 DIAGNOSIS — Z1239 Encounter for other screening for malignant neoplasm of breast: Secondary | ICD-10-CM

## 2017-10-07 HISTORY — DX: Unspecified malignant neoplasm of skin of other part of trunk: C44.509

## 2017-10-07 NOTE — Progress Notes (Signed)
No complaints today.   Pap Smear: Pap smear not completed today. Last Pap smear was in March 2017 at [facility] and normal per patient. Per patient has no history of an abnormal Pap smear. Patient has a history of a hysterectomy in December 2017 due to a benign tumor per patient. Patient no longer needs Pap smears due to her history of a hysterectomy for benign reasons per BCCCP and ACOG guidelines. No Pap smear results are in Epic.  Physical exam: Breasts Breasts symmetrical. No skin abnormalities bilateral breasts. No nipple retraction bilateral breasts. No nipple discharge bilateral breasts. No lymphadenopathy. No lumps palpated bilateral breasts. No complaints of pain or tenderness on exam. Referred patient to the [facility] for a screening mammogram. Appointment scheduled for Thursday, October 07, 2017 at 1510.        Pelvic/Bimanual No Pap smear completed today since last Pap smear was in March 2017 and patient has a history of a hysterectomy for benign reasons. Pap smear not indicated per BCCCP guidelines.   Smoking History: Patient has never smoked.  Patient Navigation: Patient education provided. Access to services provided for patient through [facility] program.   Colorectal Cancer Screening: Per patient had a colonoscopy completed in 2018. No complaints today. FIT Test given to patient to complete and return to BCCCP.  Breast and Cervical Cancer Risk Assessment: Patient has no family history of breast cancer, known genetic mutations, or radiation treatment to the chest before age 71. Patient has no history of cervical dysplasia, immunocompromised, or DES exposure in-utero.  Risk Assessment    Risk Scores      10/07/2017   Last edited by: Armond Hang, LPN   5-year risk: 1.5 %   Lifetime risk: 8.3 %

## 2017-10-07 NOTE — Patient Instructions (Signed)
Explained breast self awareness with Kim [name]. Patient did not need a Pap smear today due to last Pap smear was in March 2017 per patient and patient has a history of a hysterectomy for benign reasons. Let patient know that she doesn't need any further Pap smears due to her history of a hysterectomy for benign reasons. Referred patient to the [facility] for a screening mammogram. Appointment scheduled for Thursday, October 07, 2017 at 1510. Let patient know the [facility] will follow up with her within the next couple weeks with results of mammogram by letter or phone. Kim [name] verbalized understanding.  Kim [name], Arvil Chaco, RN 2:41 PM

## 2017-10-08 ENCOUNTER — Encounter (HOSPITAL_COMMUNITY): Payer: Self-pay | Admitting: *Deleted

## 2019-07-05 IMAGING — MG DIGITAL SCREENING BILATERAL MAMMOGRAM WITH TOMO AND CAD
8 series · 8 of 24 positions shown · non-contrast
Comparison: Previous exam(s).

CLINICAL DATA: Screening.

EXAM:
DIGITAL SCREENING BILATERAL MAMMOGRAM WITH TOMO AND CAD

[L MLO synth-2D]
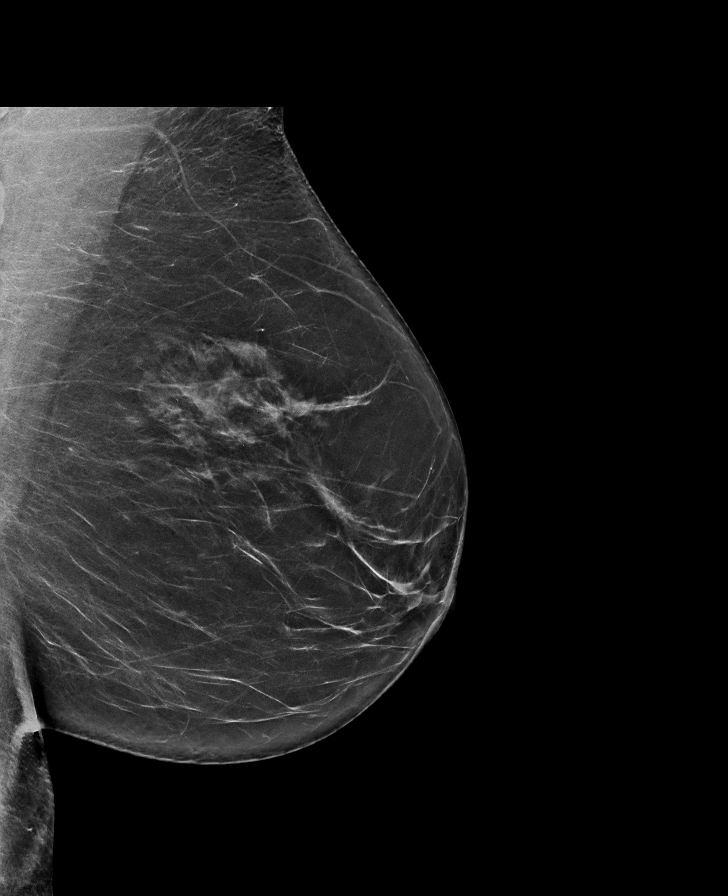

[L CC synth-2D]
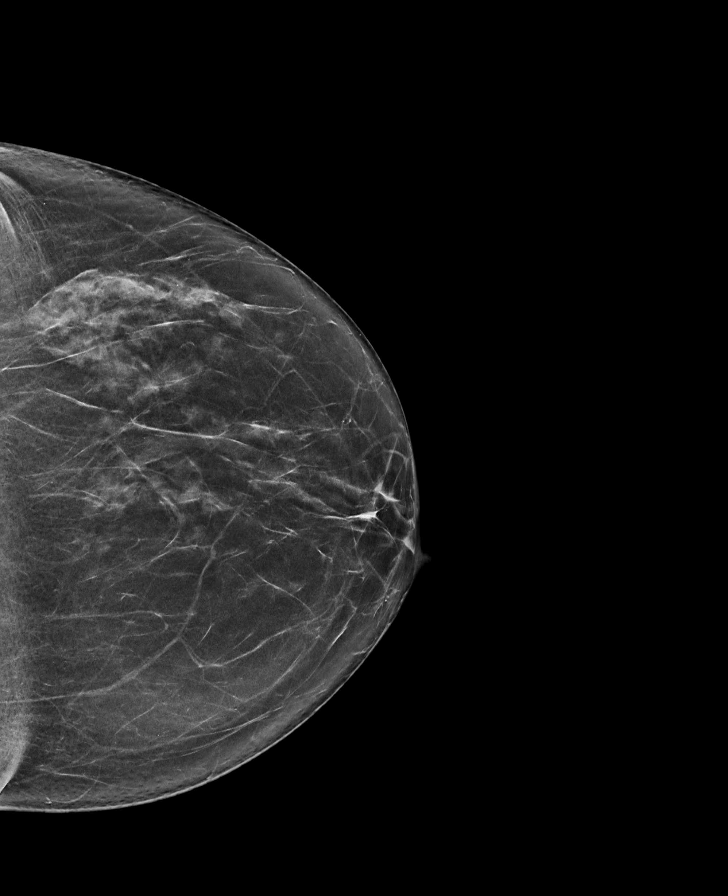

[R MLO synth-2D]
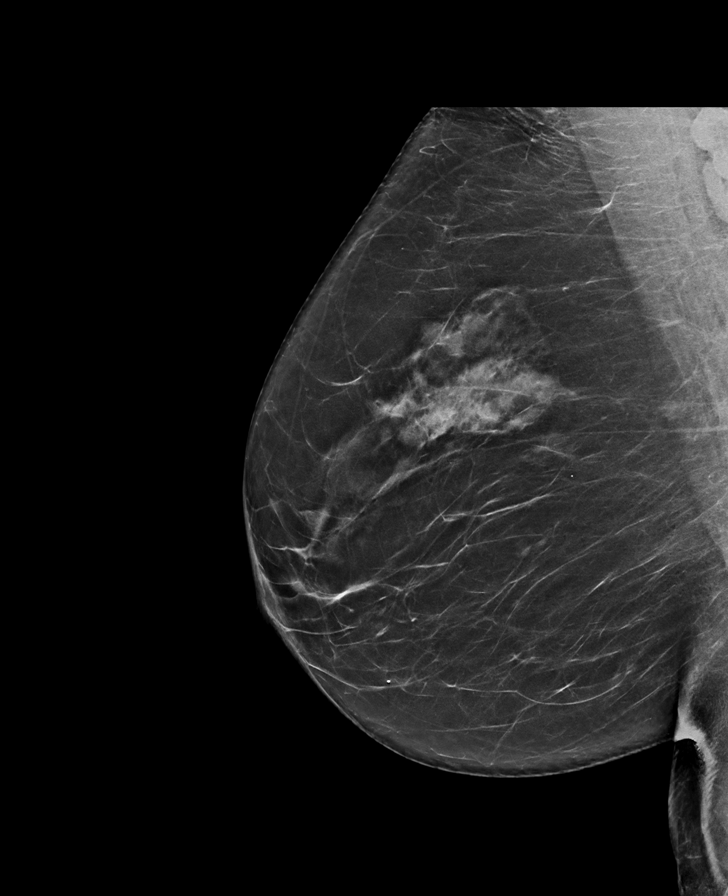

[R CC synth-2D]
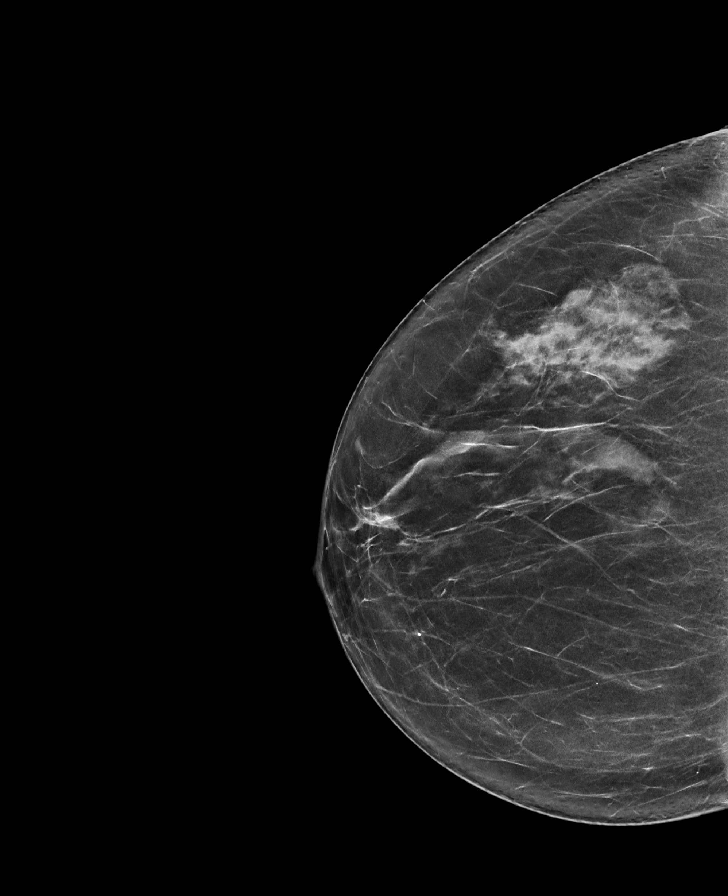

[L CC tomo · tomo slice 37/72.0]
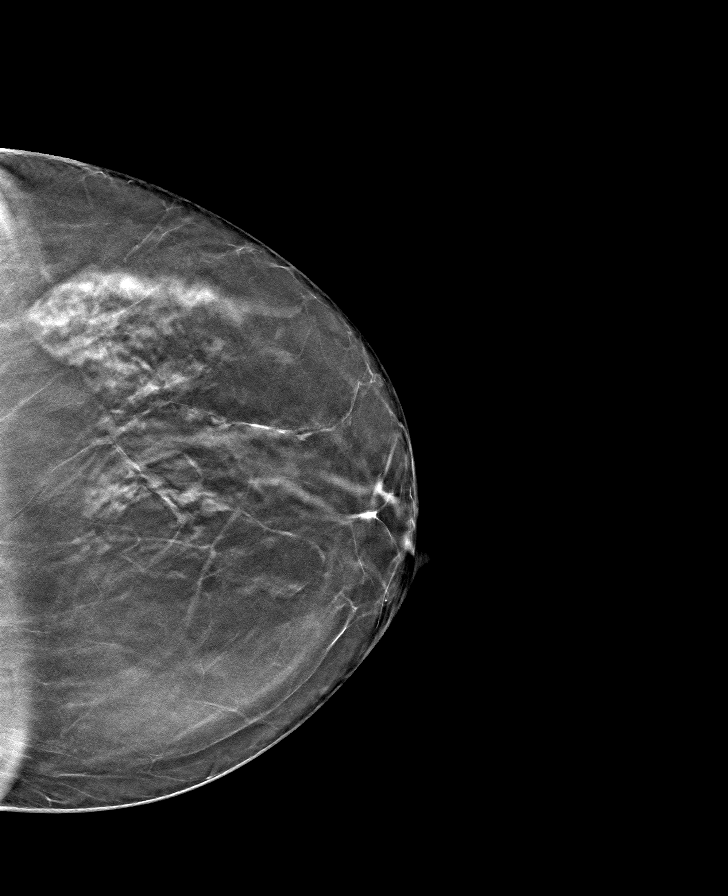

[R MLO tomo · tomo slice 39/77.0]
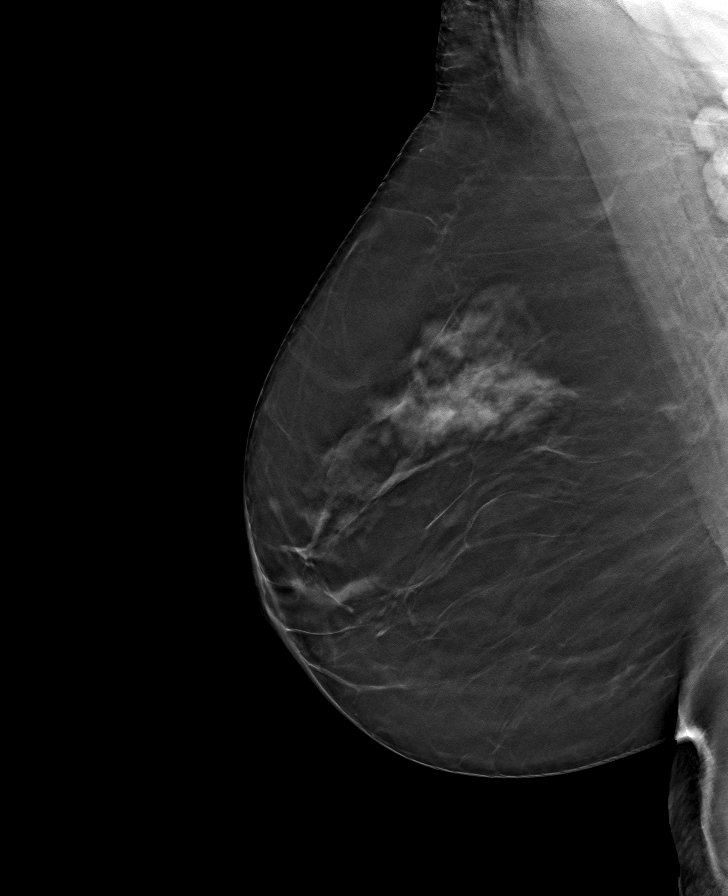

[L MLO tomo · tomo slice 39/78.0]
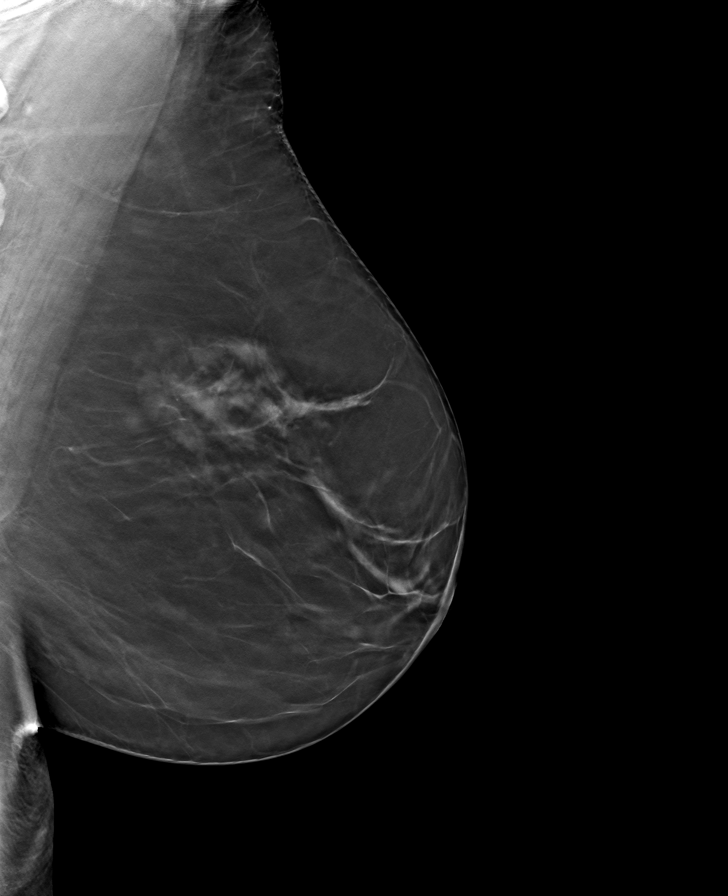

[R CC tomo · tomo slice 36/71.0]
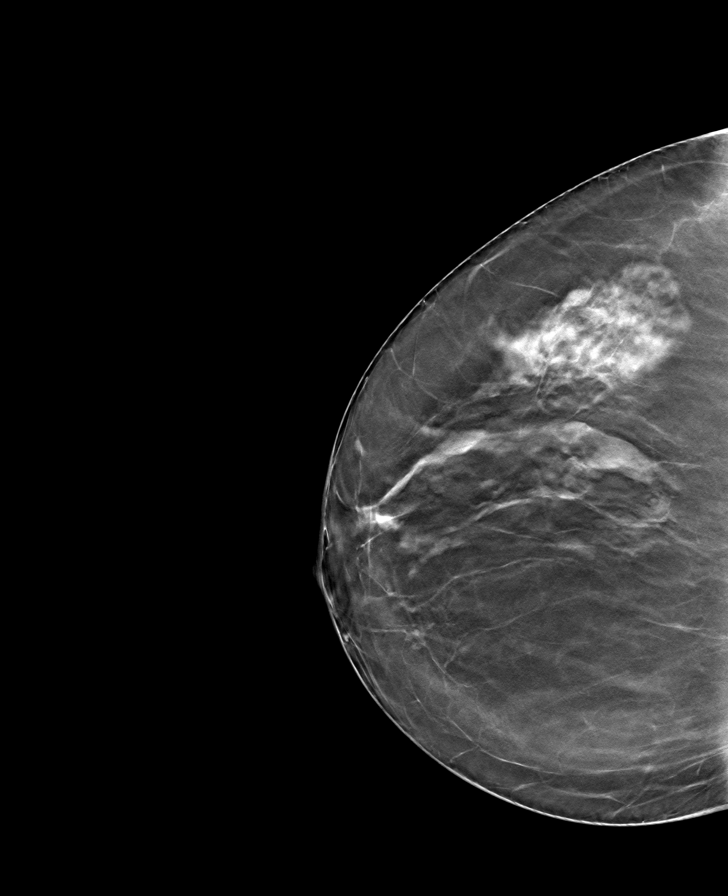

[8 of 24 positions shown; findings below may reference images not displayed]

ACR Breast Density Category b: There are scattered areas of
fibroglandular density.
FINDINGS: There are no findings suspicious for malignancy. Images were
processed with CAD.
IMPRESSION: No mammographic evidence of malignancy. A result letter of this
screening mammogram will be mailed directly to the patient.

RECOMMENDATION:
Screening mammogram in one year. (Code:[id number])

BI-RADS CATEGORY  1: Negative.

## 2019-07-17 ENCOUNTER — Other Ambulatory Visit: Payer: Self-pay

## 2019-07-17 DIAGNOSIS — Z1231 Encounter for screening mammogram for malignant neoplasm of breast: Secondary | ICD-10-CM

## 2019-07-31 ENCOUNTER — Other Ambulatory Visit: Payer: Self-pay | Admitting: Obstetrics and Gynecology

## 2019-07-31 DIAGNOSIS — Z1231 Encounter for screening mammogram for malignant neoplasm of breast: Secondary | ICD-10-CM

## 2019-08-17 ENCOUNTER — Ambulatory Visit
Admission: RE | Admit: 2019-08-17 | Discharge: 2019-08-17 | Disposition: A | Discharge status: Home / Self Care | Payer: No Typology Code available for payment source | Source: Ambulatory Visit | Attending: Obstetrics and Gynecology | Admitting: Obstetrics and Gynecology

## 2019-08-17 ENCOUNTER — Other Ambulatory Visit: Payer: Self-pay

## 2019-08-17 ENCOUNTER — Ambulatory Visit: Payer: Self-pay | Admitting: *Deleted

## 2019-08-17 VITALS — BP 118/82 | Temp 98.2°F | Wt 150.7 lb

## 2019-08-17 DIAGNOSIS — Z1239 Encounter for other screening for malignant neoplasm of breast: Secondary | ICD-10-CM

## 2019-08-17 DIAGNOSIS — Z1231 Encounter for screening mammogram for malignant neoplasm of breast: Secondary | ICD-10-CM

## 2019-08-17 NOTE — Progress Notes (Signed)
Kim [name] is a 59 y.o. female who presents to [facility] clinic today with no complaints.   Pap Smear: Pap not smear completed today. Last Pap smear was  in 2017 at  the [facility] [place] [facility] and was normal per patient. Per patient has no history of an abnormal Pap smear. 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 Patient doesn't need any further Pap smears due to her history of a hysterectomy and no history of an abnormal Pap smear per BCCCP and ASCCP guidelines. Last Pap smear result is in Epic.   Physical exam: Breasts Breasts symmetrical. No skin abnormalities bilateral breasts. No nipple retraction bilateral breasts. No nipple discharge bilateral breasts. No lymphadenopathy. No lumps palpated bilateral breasts. No complaints of pain or tenderness on exam.      Pelvic/Bimanual Pap is not indicated today per BCCCP guidelines.   Smoking History: Patient has never smoked.   Patient Navigation: Patient education provided. Access to services provided for patient through [facility] program.   Colorectal Cancer Screening: Per patient had a colonoscopy completed in 2018. No complaints today.    Breast and Cervical Cancer Risk Assessment: Patient has no family history of breast cancer, known genetic mutations, or radiation treatment to the chest before age 66. Patient has no history of cervical dysplasia, immunocompromised, or DES exposure in-utero.  Risk Assessment    Risk Scores      08/17/2019 10/07/2017   Last edited by: Kim Revel, LPN Kim Infante H, LPN   5-year risk: 1.6 % 1.5 %   Lifetime risk: 7.9 % 8.3 %          A: BCCCP exam without pap smear   P: Referred patient to the [facility] for a screening mammogram on the mobile unit. Appointment scheduled Thursday, August 17, 2019 at 1610.  Kim Parish, RN 08/17/2019 4:12 PM

## 2019-08-17 NOTE — Patient Instructions (Addendum)
Explained breast self awareness with Kim [name]. Patient did not need a Pap smear today due to patient has a history of a hysterectomy for benign reasons. Let patient know that she doesn't need any further Pap smears due to her history of a hysterectomy and no history of an abnormal Pap smear. Referred patient to the [facility] for a screening mammogram on the mobile unit. Appointment scheduled Thursday, August 17, 2019 at 1610. Patient aware of appointment and will be there. Let patient know the [facility] will follow up with her within the next couple weeks with results of her mammogram by letter or phone. Kim [name] verbalized understanding.  Lynise Porr, Arvil Chaco, RN 4:12 PM

## 2020-10-02 ENCOUNTER — Other Ambulatory Visit: Payer: Self-pay | Admitting: Obstetrics and Gynecology

## 2020-10-02 DIAGNOSIS — Z1231 Encounter for screening mammogram for malignant neoplasm of breast: Secondary | ICD-10-CM

## 2020-10-24 ENCOUNTER — Ambulatory Visit: Payer: No Typology Code available for payment source

## 2020-11-14 ENCOUNTER — Ambulatory Visit: Payer: No Typology Code available for payment source

## 2020-11-19 ENCOUNTER — Other Ambulatory Visit: Payer: Self-pay | Admitting: Obstetrics and Gynecology

## 2020-11-19 DIAGNOSIS — Z1231 Encounter for screening mammogram for malignant neoplasm of breast: Secondary | ICD-10-CM

## 2021-01-09 ENCOUNTER — Ambulatory Visit: Payer: No Typology Code available for payment source

## 2021-03-13 ENCOUNTER — Ambulatory Visit: Payer: No Typology Code available for payment source

## 2021-03-21 NOTE — Progress Notes (Unsigned)
Patient no showed appointment

## 2021-05-30 ENCOUNTER — Other Ambulatory Visit: Payer: Self-pay | Admitting: Nurse Practitioner

## 2021-05-30 DIAGNOSIS — Z1231 Encounter for screening mammogram for malignant neoplasm of breast: Secondary | ICD-10-CM

## 2021-06-03 ENCOUNTER — Other Ambulatory Visit: Payer: Self-pay

## 2021-06-03 ENCOUNTER — Ambulatory Visit
Admission: RE | Admit: 2021-06-03 | Discharge: 2021-06-03 | Disposition: A | Discharge status: Home / Self Care | Payer: No Typology Code available for payment source | Source: Ambulatory Visit | Attending: Obstetrics and Gynecology | Admitting: Obstetrics and Gynecology

## 2021-06-03 DIAGNOSIS — Z1231 Encounter for screening mammogram for malignant neoplasm of breast: Secondary | ICD-10-CM

## 2023-06-17 ENCOUNTER — Other Ambulatory Visit (HOSPITAL_COMMUNITY): Payer: Self-pay | Admitting: Registered Nurse

## 2023-06-17 DIAGNOSIS — E7800 Pure hypercholesterolemia, unspecified: Secondary | ICD-10-CM

## 2023-06-28 ENCOUNTER — Ambulatory Visit (HOSPITAL_COMMUNITY)
Admission: RE | Admit: 2023-06-28 | Discharge: 2023-06-28 | Disposition: A | Discharge status: Home / Self Care | Payer: Self-pay | Source: Ambulatory Visit | Attending: Registered Nurse | Admitting: Registered Nurse

## 2023-06-28 DIAGNOSIS — E7800 Pure hypercholesterolemia, unspecified: Secondary | ICD-10-CM | POA: Insufficient documentation

## 2023-08-10 ENCOUNTER — Other Ambulatory Visit: Payer: Self-pay | Admitting: Registered Nurse

## 2023-08-10 DIAGNOSIS — Z1231 Encounter for screening mammogram for malignant neoplasm of breast: Secondary | ICD-10-CM

## 2023-08-19 ENCOUNTER — Ambulatory Visit

## 2023-08-30 ENCOUNTER — Ambulatory Visit
Admission: RE | Admit: 2023-08-30 | Discharge: 2023-08-30 | Disposition: A | Discharge status: Home / Self Care | Source: Ambulatory Visit | Attending: Registered Nurse | Admitting: Registered Nurse

## 2023-08-30 DIAGNOSIS — Z1231 Encounter for screening mammogram for malignant neoplasm of breast: Secondary | ICD-10-CM

## 2023-12-06 ENCOUNTER — Encounter (HOSPITAL_BASED_OUTPATIENT_CLINIC_OR_DEPARTMENT_OTHER): Payer: Self-pay

## 2023-12-06 ENCOUNTER — Other Ambulatory Visit: Payer: Self-pay

## 2023-12-06 ENCOUNTER — Emergency Department (HOSPITAL_BASED_OUTPATIENT_CLINIC_OR_DEPARTMENT_OTHER)

## 2023-12-06 ENCOUNTER — Emergency Department (HOSPITAL_BASED_OUTPATIENT_CLINIC_OR_DEPARTMENT_OTHER): Admitting: Radiology

## 2023-12-06 ENCOUNTER — Inpatient Hospital Stay (HOSPITAL_BASED_OUTPATIENT_CLINIC_OR_DEPARTMENT_OTHER)
Admission: EM | Admit: 2023-12-06 | Discharge: 2023-12-09 | DRG: 176 | Disposition: A | Discharge status: Home / Self Care | Attending: Internal Medicine | Admitting: Internal Medicine

## 2023-12-06 DIAGNOSIS — E876 Hypokalemia: Secondary | ICD-10-CM | POA: Diagnosis present

## 2023-12-06 DIAGNOSIS — I824Y2 Acute embolism and thrombosis of unspecified deep veins of left proximal lower extremity: Secondary | ICD-10-CM | POA: Diagnosis not present

## 2023-12-06 DIAGNOSIS — Z885 Allergy status to narcotic agent status: Secondary | ICD-10-CM | POA: Diagnosis not present

## 2023-12-06 DIAGNOSIS — Z8543 Personal history of malignant neoplasm of ovary: Secondary | ICD-10-CM | POA: Diagnosis not present

## 2023-12-06 DIAGNOSIS — R0602 Shortness of breath: Secondary | ICD-10-CM | POA: Diagnosis not present

## 2023-12-06 DIAGNOSIS — D509 Iron deficiency anemia, unspecified: Secondary | ICD-10-CM | POA: Diagnosis present

## 2023-12-06 DIAGNOSIS — Z8249 Family history of ischemic heart disease and other diseases of the circulatory system: Secondary | ICD-10-CM | POA: Diagnosis not present

## 2023-12-06 DIAGNOSIS — Z79890 Hormone replacement therapy (postmenopausal): Secondary | ICD-10-CM | POA: Diagnosis not present

## 2023-12-06 DIAGNOSIS — Z743 Need for continuous supervision: Secondary | ICD-10-CM | POA: Diagnosis not present

## 2023-12-06 DIAGNOSIS — I82412 Acute embolism and thrombosis of left femoral vein: Secondary | ICD-10-CM | POA: Diagnosis present

## 2023-12-06 DIAGNOSIS — D508 Other iron deficiency anemias: Secondary | ICD-10-CM | POA: Insufficient documentation

## 2023-12-06 DIAGNOSIS — I82432 Acute embolism and thrombosis of left popliteal vein: Secondary | ICD-10-CM | POA: Diagnosis present

## 2023-12-06 DIAGNOSIS — Z7982 Long term (current) use of aspirin: Secondary | ICD-10-CM

## 2023-12-06 DIAGNOSIS — I358 Other nonrheumatic aortic valve disorders: Secondary | ICD-10-CM | POA: Diagnosis present

## 2023-12-06 DIAGNOSIS — E785 Hyperlipidemia, unspecified: Secondary | ICD-10-CM | POA: Diagnosis present

## 2023-12-06 DIAGNOSIS — Z83719 Family history of colon polyps, unspecified: Secondary | ICD-10-CM

## 2023-12-06 DIAGNOSIS — I2694 Multiple subsegmental pulmonary emboli without acute cor pulmonale: Principal | ICD-10-CM | POA: Diagnosis present

## 2023-12-06 DIAGNOSIS — Z7901 Long term (current) use of anticoagulants: Secondary | ICD-10-CM

## 2023-12-06 DIAGNOSIS — Z79899 Other long term (current) drug therapy: Secondary | ICD-10-CM

## 2023-12-06 DIAGNOSIS — Z85038 Personal history of other malignant neoplasm of large intestine: Secondary | ICD-10-CM

## 2023-12-06 DIAGNOSIS — Z880 Allergy status to penicillin: Secondary | ICD-10-CM | POA: Diagnosis not present

## 2023-12-06 DIAGNOSIS — Z86711 Personal history of pulmonary embolism: Secondary | ICD-10-CM | POA: Diagnosis not present

## 2023-12-06 DIAGNOSIS — K625 Hemorrhage of anus and rectum: Secondary | ICD-10-CM | POA: Diagnosis present

## 2023-12-06 DIAGNOSIS — K449 Diaphragmatic hernia without obstruction or gangrene: Secondary | ICD-10-CM | POA: Diagnosis present

## 2023-12-06 DIAGNOSIS — F319 Bipolar disorder, unspecified: Secondary | ICD-10-CM | POA: Diagnosis present

## 2023-12-06 DIAGNOSIS — E039 Hypothyroidism, unspecified: Secondary | ICD-10-CM | POA: Diagnosis present

## 2023-12-06 DIAGNOSIS — Z83438 Family history of other disorder of lipoprotein metabolism and other lipidemia: Secondary | ICD-10-CM | POA: Diagnosis not present

## 2023-12-06 DIAGNOSIS — Z800 Family history of malignant neoplasm of digestive organs: Secondary | ICD-10-CM

## 2023-12-06 DIAGNOSIS — D649 Anemia, unspecified: Secondary | ICD-10-CM | POA: Diagnosis present

## 2023-12-06 DIAGNOSIS — I2699 Other pulmonary embolism without acute cor pulmonale: Secondary | ICD-10-CM | POA: Diagnosis present

## 2023-12-06 DIAGNOSIS — Z1152 Encounter for screening for COVID-19: Secondary | ICD-10-CM | POA: Diagnosis not present

## 2023-12-06 DIAGNOSIS — E872 Acidosis, unspecified: Secondary | ICD-10-CM | POA: Diagnosis present

## 2023-12-06 DIAGNOSIS — F4310 Post-traumatic stress disorder, unspecified: Secondary | ICD-10-CM | POA: Diagnosis present

## 2023-12-06 DIAGNOSIS — Z8509 Personal history of malignant neoplasm of other digestive organs: Secondary | ICD-10-CM | POA: Diagnosis not present

## 2023-12-06 DIAGNOSIS — K219 Gastro-esophageal reflux disease without esophagitis: Secondary | ICD-10-CM | POA: Diagnosis present

## 2023-12-06 LAB — HEPATIC FUNCTION PANEL
ALT: 14 U/L (ref 0–44)
AST: 26 U/L (ref 15–41)
Albumin: 4.4 g/dL (ref 3.5–5.0)
Alkaline Phosphatase: 102 U/L (ref 38–126)
Bilirubin, Direct: <0.1 mg/dL (ref 0.0–0.2)
Total Bilirubin: 0.2 mg/dL (ref 0.0–1.2)
Total Protein: 6.9 g/dL (ref 6.5–8.1)

## 2023-12-06 LAB — BASIC METABOLIC PANEL WITH GFR
Anion gap: 13 (ref 5–15)
BUN: 10 mg/dL (ref 8–23)
CO2: 21 mmol/L — ABNORMAL LOW (ref 22–32)
Calcium: 9 mg/dL (ref 8.9–10.3)
Chloride: 104 mmol/L (ref 98–111)
Creatinine, Ser: 0.64 mg/dL (ref 0.44–1.00)
GFR, Estimated: >60 mL/min (ref >60)
Glucose, Bld: 98 mg/dL (ref 70–99)
Potassium: 3.3 mmol/L — ABNORMAL LOW (ref 3.5–5.1)
Sodium: 138 mmol/L (ref 135–145)

## 2023-12-06 LAB — CBC
HCT: 24.8 % — ABNORMAL LOW (ref 36.0–46.0)
Hemoglobin: 7.1 g/dL — ABNORMAL LOW (ref 12.0–15.0)
MCH: 22.8 pg — ABNORMAL LOW (ref 26.0–34.0)
MCHC: 28.6 g/dL — ABNORMAL LOW (ref 30.0–36.0)
MCV: 79.5 fL — ABNORMAL LOW (ref 80.0–100.0)
Platelets: 366 K/uL (ref 150–400)
RBC: 3.12 MIL/uL — ABNORMAL LOW (ref 3.87–5.11)
RDW: 16.1 % — ABNORMAL HIGH (ref 11.5–15.5)
WBC: 7.9 K/uL (ref 4.0–10.5)
nRBC: 0 % (ref 0.0–0.2)

## 2023-12-06 LAB — IRON AND TIBC
Iron: <10 ug/dL — ABNORMAL LOW (ref 28–170)
TIBC: 480 ug/dL — ABNORMAL HIGH (ref 250–450)

## 2023-12-06 LAB — URINALYSIS, W/ REFLEX TO CULTURE (INFECTION SUSPECTED)
Bacteria, UA: NONE SEEN
Bilirubin Urine: NEGATIVE
Glucose, UA: NEGATIVE mg/dL
Hgb urine dipstick: NEGATIVE
Ketones, ur: NEGATIVE mg/dL
Nitrite: NEGATIVE
Protein, ur: NEGATIVE mg/dL
Specific Gravity, Urine: 1.022 (ref 1.005–1.030)
pH: 5.5 (ref 5.0–8.0)

## 2023-12-06 LAB — FOLATE: Folate: 19.4 ng/mL (ref >5.9)

## 2023-12-06 LAB — TROPONIN T, HIGH SENSITIVITY
Troponin T High Sensitivity: <15 ng/L (ref 0–19)
Troponin T High Sensitivity: <15 ng/L (ref 0–19)

## 2023-12-06 LAB — RETICULOCYTES
Immature Retic Fract: 26.4 % — ABNORMAL HIGH (ref 2.3–15.9)
RBC.: 3.14 MIL/uL — ABNORMAL LOW (ref 3.87–5.11)
Retic Count, Absolute: 84.2 K/uL (ref 19.0–186.0)
Retic Ct Pct: 2.7 % (ref 0.4–3.1)

## 2023-12-06 LAB — OCCULT BLOOD X 1 CARD TO LAB, STOOL: Fecal Occult Bld: NEGATIVE

## 2023-12-06 LAB — FERRITIN: Ferritin: 17 ng/mL (ref 11–307)

## 2023-12-06 LAB — D-DIMER, QUANTITATIVE: D-Dimer, Quant: 4.76 ug{FEU}/mL — ABNORMAL HIGH (ref 0.00–0.50)

## 2023-12-06 LAB — LACTATE DEHYDROGENASE: LDH: 239 U/L — ABNORMAL HIGH (ref 98–192)

## 2023-12-06 LAB — VITAMIN B12: Vitamin B-12: 273 pg/mL (ref 180–914)

## 2023-12-06 MED ORDER — ACETAMINOPHEN 650 MG RE SUPP: 650.0000 mg | Freq: Four times a day (QID) | RECTAL | Status: DC | PRN

## 2023-12-06 MED ORDER — ONDANSETRON HCL 4 MG/2ML IJ SOLN: 4.0000 mg | Freq: Four times a day (QID) | INTRAMUSCULAR | Status: DC | PRN

## 2023-12-06 MED ORDER — HEPARIN BOLUS VIA INFUSION
3700.0000 [IU] | Freq: Once | INTRAVENOUS | Status: AC
  Administered 2023-12-06: 3700 [IU] via INTRAVENOUS

## 2023-12-06 MED ORDER — MELATONIN 3 MG PO TABS: 3.0000 mg | ORAL_TABLET | Freq: Every evening | ORAL | Status: DC | PRN

## 2023-12-06 MED ORDER — IOHEXOL 300 MG/ML  SOLN
100.0000 mL | Freq: Once | INTRAMUSCULAR | Status: AC | PRN
  Administered 2023-12-06: 100 mL via INTRAVENOUS

## 2023-12-06 MED ORDER — POTASSIUM CHLORIDE CRYS ER 20 MEQ PO TBCR
40.0000 meq | EXTENDED_RELEASE_TABLET | Freq: Once | ORAL | Status: AC
Start: 2023-12-07 — End: 2023-12-07
  Administered 2023-12-07: 40 meq via ORAL
  Filled 2023-12-06: qty 2

## 2023-12-06 MED ORDER — HEPARIN (PORCINE) 25000 UT/250ML-% IV SOLN
1000.0000 [IU]/h | INTRAVENOUS | Status: DC
  Administered 2023-12-06 – 2023-12-08 (×3): 1000 [IU]/h via INTRAVENOUS
  Filled 2023-12-06 (×3): qty 250

## 2023-12-06 MED ORDER — ACETAMINOPHEN 325 MG PO TABS
650.0000 mg | ORAL_TABLET | Freq: Four times a day (QID) | ORAL | Status: DC | PRN
  Administered 2023-12-07 – 2023-12-08 (×3): 650 mg via ORAL
  Filled 2023-12-06 (×3): qty 2

## 2023-12-06 NOTE — Progress Notes (Signed)
(  Carryover admission to the Day Admitter; accepted by Dr.  Sim as transfer from  [facility]  to a  med-tele bed at  [facility]  for acute pulmonary emboli).   Briefly, per chart review review, this is a 63 year old female who is being acute pulmonary emboli identified on CTA chest performed at [facility] after presenting to the latter complaining of shortness of breath, cough, and generalized weakness.  Started on heparin  drip.  Vital signs notable for the following: Afebrile; heart rates in the 80s to low 100s; systolic blood pressures in the 1 teens to 140s, without any documented hypotension. O2 sat's in the range of 93 to 96% on room air.  Presenting labs also notable for potassium level of 3.3.  I have placed some additional preliminary admit orders via the adult multi-morbid admission order set. I have also ordered continuation of existing heparin  drip, and have ordered echocardiogram to occur in the morning.  For her hypokalemia, ordered potassium chloride  40 mill colons p.o. x 1 dose now.  For her generalized weakness, ordered fall precautions as well as PT/OT consults to start in the morning. I have ordered morning labs in the form of CMP, CBC, and magnesium  level.    Eva Pore, DO Hospitalist

## 2023-12-06 NOTE — ED Provider Notes (Signed)
 [place] EMERGENCY DEPARTMENT AT [facility] Provider Note  CSN: [id number] Arrival date & time: 12/06/23 1334  Chief Complaint(s) Shortness of Breath and Palpitations  HPI Kim [name] is a 63 y.o. 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  Patient denies any recent fevers or infections.  No coughing or congestion.  No abdominal pain.  She does report streaky blood with wiping but no overt rectal bleeding.  No urinary symptoms.  The history is provided by the patient.    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 Authorizing Provider  aspirin-acetaminophen -caffeine (EXCEDRIN MIGRAINE) 250-250-65 MG tablet Take 2 tablets by mouth daily as needed for headache or migraine.    [provider]  atorvastatin  (LIPITOR) 20 MG tablet Take 20 mg by mouth at bedtime. 02/17/16   [provider]  buPROPion  (WELLBUTRIN  XL) 150 MG 24 hr tablet Take 150 mg by mouth daily.    [provider]  buPROPion  (WELLBUTRIN  XL) 300 MG 24 hr tablet Take 450 mg by mouth every morning.  02/03/16   [provider]  desvenlafaxine (PRISTIQ) 50 MG 24 hr tablet Take 50 mg by mouth every morning. 02/03/16   [provider]  lamoTRIgine  (LAMICTAL ) 200 MG tablet Take 200 mg by mouth at bedtime. 02/03/16   [provider]  senna-docusate (SENOKOT-S) 8.6-50 MG tablet Take 2 tablets by mouth at bedtime. For constipation 02/28/16   Svalina, Gorica, MD  traZODone  (DESYREL ) 50 MG tablet Take 50 mg by mouth at bedtime.    [provider]                                                                                                                                    Allergies Morphine  and codeine and Penicillins  Review of Systems Review of Systems As noted in HPI  Physical Exam Vital Signs  I have reviewed the triage vital signs BP 135/73 (BP  Location: Right Arm)   Pulse 82   Temp 98.6 F (37 C) (Oral)   Resp 13   Ht 5' 1 (1.549 m)   Wt 65.8 kg   LMP  (LMP Unknown)   SpO2 96%   BMI 27.40 kg/m   Physical Exam Vitals reviewed.  Constitutional:      General: She is not in acute distress.    Appearance: She is well-developed. She is not diaphoretic.  HENT:     Head: Normocephalic and atraumatic.     Nose: Nose normal.  Eyes:     General: No scleral icterus.       Right eye: No discharge.        Left eye: No discharge.     Conjunctiva/sclera: Conjunctivae normal.     Pupils: Pupils are equal, round, and reactive to light.  Cardiovascular:     Rate and Rhythm: Regular rhythm.     Heart sounds: No murmur heard.    No friction rub. No gallop.  Pulmonary:     Effort: Pulmonary effort is normal. No respiratory distress.     Breath sounds: Normal breath sounds. No stridor. No rales.  Abdominal:     General: There is no distension.     Palpations: Abdomen is soft.     Tenderness: There is no abdominal tenderness.  Musculoskeletal:         General: No tenderness.     Cervical back: Normal range of motion and neck supple.  Skin:    General: Skin is warm and dry.     Findings: No erythema or rash.  Neurological:     Mental Status: She is alert and oriented to person, place, and time.     ED Results and Treatments Labs (all labs ordered are listed, but only abnormal results are displayed) Labs Reviewed  BASIC METABOLIC PANEL WITH GFR - Abnormal; Notable for the following components:      Result Value   Potassium 3.3 (*)    CO2 21 (*)    All other components within normal limits  CBC - Abnormal; Notable for the following components:   RBC 3.12 (*)    Hemoglobin 7.1 (*)    HCT 24.8 (*)    MCV 79.5 (*)    MCH 22.8 (*)    MCHC 28.6 (*)    RDW 16.1 (*)    All other components within normal limits  RETICULOCYTES - Abnormal; Notable for the following components:   RBC. 3.14 (*)    Immature Retic Fract 26.4 (*)    All other components within normal limits  LACTATE DEHYDROGENASE - Abnormal; Notable for the following components:   LDH 239 (*)    All other components within normal limits  URINALYSIS, W/ REFLEX TO CULTURE (INFECTION SUSPECTED) - Abnormal; Notable for the following components:   Leukocytes,Ua MODERATE (*)    All other components within normal limits  D-DIMER, QUANTITATIVE - Abnormal; Notable for the following components:   D-Dimer, Quant 4.76 (*)    All other components within normal limits  URINE CULTURE  OCCULT BLOOD X 1 CARD TO LAB, STOOL  FERRITIN  HEPATIC FUNCTION PANEL  VITAMIN B12  FOLATE  IRON  AND TIBC  HAPTOGLOBIN  TROPONIN T, HIGH SENSITIVITY  TROPONIN T, HIGH SENSITIVITY  EKG     Radiology CT CHEST ABDOMEN PELVIS W CONTRAST Result Date: 12/06/2023 CLINICAL DATA:  Anemia, GI bleed. Shortness of breath. Heart racing. EXAM: CT CHEST, ABDOMEN, AND PELVIS WITH CONTRAST  TECHNIQUE: Multidetector CT imaging of the chest, abdomen and pelvis was performed following the standard protocol during bolus administration of intravenous contrast. RADIATION DOSE REDUCTION: This exam was performed according to the departmental dose-optimization program which includes automated exposure control, adjustment of the mA and/or kV according to patient size and/or use of iterative reconstruction technique. CONTRAST:  OMNIPAQUE  IOHEXOL  300 MG/ML  SOLN COMPARISON:  None Available. FINDINGS: CT CHEST FINDINGS Cardiovascular: Heart is normal size. Aorta is normal caliber. Filling defects noted within multiple right pulmonary arteries in all lobes of the right lung compatible with pulmonary emboli. Suspect small subsegmental pulmonary embolus in the left upper lobe. No evidence of right heart strain. Mediastinum/Nodes: Moderate-sized hiatal hernia. No mediastinal, hilar, or axillary adenopathy. Trachea and esophagus are unremarkable. Thyroid unremarkable. Lungs/Pleura: Linear areas of scarring in the lingula and right middle lobe as well as lung bases/lower lobes. No acute confluent opacities or effusions. Musculoskeletal: Chest wall soft tissues are unremarkable. No acute bony abnormality. CT ABDOMEN PELVIS FINDINGS Hepatobiliary: No focal hepatic abnormality. Gallbladder unremarkable. Pancreas: No focal abnormality or ductal dilatation. Spleen: No focal abnormality.  Normal size. Adrenals/Urinary Tract: No adrenal abnormality. No focal renal abnormality. No stones or hydronephrosis. Urinary bladder is unremarkable. Stomach/Bowel: Stomach, large and small bowel grossly unremarkable. Vascular/Lymphatic: No evidence of aneurysm or adenopathy. Reproductive: Prior hysterectomy.  No adnexal masses. Other: No free fluid or free air. Musculoskeletal: No acute bony abnormality. IMPRESSION: Incidental pulmonary emboli noted in all lobes of the right lung and left upper lobe. No evidence of right heart strain.  Moderate-sized hiatal hernia. No acute findings in the abdomen or pelvis. These results were called by telephone at the time of interpretation on 12/06/2023 at 5:10 pm to provider [facility] , who verbally acknowledged these results. Electronically Signed   By: Franky Crease M.D.   On: 12/06/2023 17:11   DG Chest 2 View Result Date: 12/06/2023 CLINICAL DATA:  Shortness of breath EXAM: CHEST - 2 VIEW COMPARISON:  June 28, 2023 cardiac CT FINDINGS: Linear left basilar opacity likely representing atelectasis. No pleural effusions or pneumothorax. Likely hiatal hernia.  No acute osseous findings. IMPRESSION: Hiatal hernia.  No acute airspace disease. Electronically Signed   By: Michaeline Blanch M.D.   On: 12/06/2023 14:26    Medications Ordered in ED Medications  iohexol  (OMNIPAQUE ) 300 MG/ML solution 100 mL (100 mLs Intravenous Contrast Given 12/06/23 1649)   Procedures .Critical Care  Performed by: Trine Raynell Moder, MD Authorized by: Trine Raynell Moder, MD   Critical care provider statement:    Critical care time (minutes):  45   Critical care time was exclusive of:  Separately billable procedures and treating other patients   Critical care was necessary to treat or prevent imminent or life-threatening deterioration of the following conditions:  Circulatory failure   Critical care was time spent personally by me on the following activities:  Development of treatment plan with patient or surrogate, discussions with consultants, evaluation of patient's response to treatment, examination of patient, obtaining history from patient or surrogate, review of old charts, re-evaluation of patient's condition, pulse oximetry, ordering and review of radiographic studies, ordering and review of laboratory studies and ordering and performing treatments and interventions   Care discussed with: admitting provider     (including critical  care time) Medical Decision Making / ED Course   Medical Decision  Making Amount and/or Complexity of Data Reviewed Labs: ordered. Decision-making details documented in ED Course. Radiology: ordered and independent interpretation performed. Decision-making details documented in ED Course. ECG/medicine tests: ordered and independent interpretation performed. Decision-making details documented in ED Course.  Risk Prescription drug management. Decision regarding hospitalization.    Generalized fatigue with history on exertion and chest discomfort differential diagnosis considered.  CBC without leukocytosis.  Hemoglobin of 7.1.  On review of records, patient had CBC in April of this year showing a hemoglobin of 13.  Patient reported strictly blood when wiping.  No frank rectal bleeding.  Hemoccult here was negative.  BUN not elevated concerning for upper GI bleed.  Anemia panel sent.  Given history of cancer, CT chest abdomen pelvis obtained to rule out recurrence, assess for PEs, pneumonia, pleural effusions.  CT notable for multiple pulmonary emboli in the right lung and left upper lobe.  Patient will require admission for treatment of the PE and workup for anemia     Final Clinical Impression(s) / ED Diagnoses Final diagnoses:  Multiple subsegmental pulmonary emboli without acute cor pulmonale (HCC)  Anemia, unspecified type    This chart was dictated using voice recognition software.  Despite best efforts to proofread,  errors can occur which can change the documentation meaning.    Trine Raynell Moder, MD 12/06/23 [phone]

## 2023-12-06 NOTE — ED Notes (Signed)
 Walked pt on pulse ox. Heart rate mainly stayed between 112-115. O2 was 95%

## 2023-12-06 NOTE — ED Triage Notes (Signed)
 Arrives ambulatory to the ED with complaints of worsening complaints of worsening shortness of breath and feeling like her heart is racing x2 weeks.

## 2023-12-06 NOTE — Progress Notes (Signed)
 PHARMACY - ANTICOAGULATION CONSULT NOTE  Pharmacy Consult for Heparin  Indication: pulmonary embolus  Allergies  Allergen Reactions   Morphine  And Codeine Nausea And Vomiting   Penicillins     Childhood reaction Has patient had a PCN reaction causing immediate rash, facial/tongue/throat swelling, SOB or lightheadedness with hypotension:unsure Has patient had a PCN reaction causing severe rash involving mucus membranes or skin necrosis:unsure Has patient had a PCN reaction that required hospitalization:No Has patient had a PCN reaction occurring within the last 10 years: No If all of the above answers are NO, then may proceed with Cephalosporin use.     Patient Measurements: Height: 5' 1 (154.9 cm) Weight: 65.8 kg (145 lb) IBW/kg (Calculated) : 47.8 HEPARIN  DW (KG): 61.6  Vital Signs: Temp: 98.6 F (37 C) (09/22 1725) Temp Source: Oral (09/22 1725) BP: 135/73 (09/22 1725) Pulse Rate: 82 (09/22 1725)  Labs: Recent Labs    12/06/23 1349  HGB 7.1*  HCT 24.8*  PLT 366  CREATININE 0.64    Estimated Creatinine Clearance: 60.9 mL/min (by C-G formula based on SCr of 0.64 mg/dL).   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 Pt presents with SOB on exertion. CT Chest from 9/22 reveals PE with no right heart strain. Pt is not on anticoagulation prior to presentation. Pt also reports streaky blood with wiping but no overt rectal bleeding.   Hgb 7.1, PLT WNL  Goal of Therapy:  Heparin  level 0.3-0.7 units/ml Monitor platelets by anticoagulation protocol: Yes   Plan:  Give 3700 units bolus x 1 Start heparin  infusion at 1000  units/hr Check anti-Xa level in 6 hours and daily while on heparin  Continue to monitor H&H and platelets  Prentice DOROTHA Favors, PharmD PGY1 Health-System Pharmacy Administration and Leadership Resident [facility]  12/06/2023 6:23 PM

## 2023-12-07 ENCOUNTER — Other Ambulatory Visit (HOSPITAL_COMMUNITY): Payer: Self-pay

## 2023-12-07 ENCOUNTER — Inpatient Hospital Stay (HOSPITAL_COMMUNITY)

## 2023-12-07 ENCOUNTER — Telehealth (HOSPITAL_COMMUNITY): Payer: Self-pay | Admitting: Pharmacy Technician

## 2023-12-07 DIAGNOSIS — Z8509 Personal history of malignant neoplasm of other digestive organs: Secondary | ICD-10-CM

## 2023-12-07 DIAGNOSIS — D509 Iron deficiency anemia, unspecified: Secondary | ICD-10-CM

## 2023-12-07 DIAGNOSIS — I2694 Multiple subsegmental pulmonary emboli without acute cor pulmonale: Secondary | ICD-10-CM | POA: Diagnosis not present

## 2023-12-07 DIAGNOSIS — D508 Other iron deficiency anemias: Secondary | ICD-10-CM | POA: Insufficient documentation

## 2023-12-07 DIAGNOSIS — K625 Hemorrhage of anus and rectum: Secondary | ICD-10-CM | POA: Insufficient documentation

## 2023-12-07 DIAGNOSIS — Z86711 Personal history of pulmonary embolism: Secondary | ICD-10-CM | POA: Diagnosis not present

## 2023-12-07 DIAGNOSIS — E039 Hypothyroidism, unspecified: Secondary | ICD-10-CM | POA: Insufficient documentation

## 2023-12-07 DIAGNOSIS — I2699 Other pulmonary embolism without acute cor pulmonale: Secondary | ICD-10-CM | POA: Diagnosis not present

## 2023-12-07 DIAGNOSIS — E785 Hyperlipidemia, unspecified: Secondary | ICD-10-CM | POA: Insufficient documentation

## 2023-12-07 DIAGNOSIS — D649 Anemia, unspecified: Secondary | ICD-10-CM

## 2023-12-07 DIAGNOSIS — F319 Bipolar disorder, unspecified: Secondary | ICD-10-CM | POA: Insufficient documentation

## 2023-12-07 LAB — COMPREHENSIVE METABOLIC PANEL WITH GFR
ALT: 13 U/L (ref 0–44)
AST: 19 U/L (ref 15–41)
Albumin: 3.3 g/dL — ABNORMAL LOW (ref 3.5–5.0)
Alkaline Phosphatase: 75 U/L (ref 38–126)
Anion gap: 12 (ref 5–15)
BUN: 12 mg/dL (ref 8–23)
CO2: 21 mmol/L — ABNORMAL LOW (ref 22–32)
Calcium: 8.4 mg/dL — ABNORMAL LOW (ref 8.9–10.3)
Chloride: 104 mmol/L (ref 98–111)
Creatinine, Ser: 0.78 mg/dL (ref 0.44–1.00)
GFR, Estimated: >60 mL/min (ref >60)
Glucose, Bld: 129 mg/dL — ABNORMAL HIGH (ref 70–99)
Potassium: 3.4 mmol/L — ABNORMAL LOW (ref 3.5–5.1)
Sodium: 137 mmol/L (ref 135–145)
Total Bilirubin: 0.4 mg/dL (ref 0.0–1.2)
Total Protein: 5.9 g/dL — ABNORMAL LOW (ref 6.5–8.1)

## 2023-12-07 LAB — CBC
HCT: 28.6 % — ABNORMAL LOW (ref 36.0–46.0)
Hemoglobin: 8.9 g/dL — ABNORMAL LOW (ref 12.0–15.0)
MCH: 25.2 pg — ABNORMAL LOW (ref 26.0–34.0)
MCHC: 31.1 g/dL (ref 30.0–36.0)
MCV: 81 fL (ref 80.0–100.0)
Platelets: 342 K/uL (ref 150–400)
RBC: 3.53 MIL/uL — ABNORMAL LOW (ref 3.87–5.11)
RDW: 17.7 % — ABNORMAL HIGH (ref 11.5–15.5)
WBC: 12.4 K/uL — ABNORMAL HIGH (ref 4.0–10.5)
nRBC: 0 % (ref 0.0–0.2)

## 2023-12-07 LAB — IRON AND TIBC
Iron: <10 ug/dL — ABNORMAL LOW (ref 28–170)
TIBC: 421 ug/dL (ref 250–450)

## 2023-12-07 LAB — CBC WITH DIFFERENTIAL/PLATELET
Abs Immature Granulocytes: 0.07 K/uL (ref 0.00–0.07)
Basophils Absolute: 0.1 K/uL (ref 0.0–0.1)
Basophils Relative: 1 %
Eosinophils Absolute: 0.1 K/uL (ref 0.0–0.5)
Eosinophils Relative: 1 %
HCT: 22.9 % — ABNORMAL LOW (ref 36.0–46.0)
Hemoglobin: 6.8 g/dL — CL (ref 12.0–15.0)
Immature Granulocytes: 1 %
Lymphocytes Relative: 26 %
Lymphs Abs: 3.3 K/uL (ref 0.7–4.0)
MCH: 23.2 pg — ABNORMAL LOW (ref 26.0–34.0)
MCHC: 29.7 g/dL — ABNORMAL LOW (ref 30.0–36.0)
MCV: 78.2 fL — ABNORMAL LOW (ref 80.0–100.0)
Monocytes Absolute: 0.7 K/uL (ref 0.1–1.0)
Monocytes Relative: 6 %
Neutro Abs: 8.4 K/uL — ABNORMAL HIGH (ref 1.7–7.7)
Neutrophils Relative %: 65 %
Platelets: 352 K/uL (ref 150–400)
RBC: 2.93 MIL/uL — ABNORMAL LOW (ref 3.87–5.11)
RDW: 16 % — ABNORMAL HIGH (ref 11.5–15.5)
WBC: 12.7 K/uL — ABNORMAL HIGH (ref 4.0–10.5)
nRBC: 0 % (ref 0.0–0.2)

## 2023-12-07 LAB — FERRITIN: Ferritin: 10 ng/mL — ABNORMAL LOW (ref 11–307)

## 2023-12-07 LAB — URINE CULTURE: Culture: NO GROWTH

## 2023-12-07 LAB — FOLATE: Folate: 13.6 ng/mL (ref >5.9)

## 2023-12-07 LAB — HEPARIN LEVEL (UNFRACTIONATED)
Heparin Unfractionated: 0.64 [IU]/mL (ref 0.30–0.70)
Heparin Unfractionated: 0.53 [IU]/mL (ref 0.30–0.70)

## 2023-12-07 LAB — PROTIME-INR
INR: 1.2 (ref 0.8–1.2)
Prothrombin Time: 15.4 s — ABNORMAL HIGH (ref 11.4–15.2)

## 2023-12-07 LAB — RESP PANEL BY RT-PCR (RSV, FLU A&B, COVID)  RVPGX2
Influenza A by PCR: NEGATIVE
Influenza B by PCR: NEGATIVE
Resp Syncytial Virus by PCR: NEGATIVE
SARS Coronavirus 2 by RT PCR: NEGATIVE

## 2023-12-07 LAB — HEMOGLOBIN AND HEMATOCRIT, BLOOD
HCT: 22.2 % — ABNORMAL LOW (ref 36.0–46.0)
Hemoglobin: 6.5 g/dL — CL (ref 12.0–15.0)

## 2023-12-07 LAB — HAPTOGLOBIN: Haptoglobin: 138 mg/dL (ref 37–355)

## 2023-12-07 LAB — VITAMIN B12: Vitamin B-12: 247 pg/mL (ref 180–914)

## 2023-12-07 LAB — LACTIC ACID, PLASMA: Lactic Acid, Venous: 1.1 mmol/L (ref 0.5–1.9)

## 2023-12-07 LAB — ECHOCARDIOGRAM COMPLETE
Height: 61 in
S' Lateral: 2.1 cm
Single Plane A4C EF: 66.5 %
Weight: 2320 [oz_av]

## 2023-12-07 LAB — HIV ANTIBODY (ROUTINE TESTING W REFLEX): HIV Screen 4th Generation wRfx: NONREACTIVE

## 2023-12-07 LAB — BRAIN NATRIURETIC PEPTIDE: B Natriuretic Peptide: 75.5 pg/mL (ref 0.0–100.0)

## 2023-12-07 LAB — PREPARE RBC (CROSSMATCH)

## 2023-12-07 LAB — MAGNESIUM: Magnesium: 1.9 mg/dL (ref 1.7–2.4)

## 2023-12-07 MED ORDER — PANTOPRAZOLE SODIUM 40 MG PO TBEC
40.0000 mg | DELAYED_RELEASE_TABLET | Freq: Every day | ORAL | Status: DC
  Administered 2023-12-07 – 2023-12-09 (×3): 40 mg via ORAL
  Filled 2023-12-07 (×3): qty 1

## 2023-12-07 MED ORDER — BENZONATATE 100 MG PO CAPS
200.0000 mg | ORAL_CAPSULE | Freq: Three times a day (TID) | ORAL | Status: DC | PRN
  Administered 2023-12-07 – 2023-12-08 (×4): 200 mg via ORAL
  Filled 2023-12-07 (×4): qty 2

## 2023-12-07 MED ORDER — BENZOCAINE-MENTHOL 6-10 MG MT LOZG: 1.0000 | LOZENGE | OROMUCOSAL | Status: DC | PRN

## 2023-12-07 MED ORDER — SERTRALINE HCL 50 MG PO TABS
50.0000 mg | ORAL_TABLET | Freq: Every day | ORAL | Status: DC
  Administered 2023-12-07 – 2023-12-09 (×3): 50 mg via ORAL
  Filled 2023-12-07 (×3): qty 1

## 2023-12-07 MED ORDER — LEVOTHYROXINE SODIUM 25 MCG PO TABS
25.0000 ug | ORAL_TABLET | Freq: Every day | ORAL | Status: DC
Start: 2023-12-07 — End: 2023-12-09
  Administered 2023-12-07 – 2023-12-09 (×3): 25 ug via ORAL
  Filled 2023-12-07 (×3): qty 1

## 2023-12-07 MED ORDER — MELATONIN 5 MG PO TABS: 5.0000 mg | ORAL_TABLET | Freq: Every evening | ORAL | Status: DC | PRN

## 2023-12-07 MED ORDER — BUSPIRONE HCL 5 MG PO TABS
15.0000 mg | ORAL_TABLET | Freq: Two times a day (BID) | ORAL | Status: DC
Start: 2023-12-07 — End: 2023-12-09
  Administered 2023-12-07 – 2023-12-08 (×4): 15 mg via ORAL
  Filled 2023-12-07 (×4): qty 3

## 2023-12-07 MED ORDER — GUAIFENESIN 100 MG/5ML PO LIQD
5.0000 mL | ORAL | Status: DC | PRN
  Administered 2023-12-07 – 2023-12-08 (×3): 5 mL via ORAL
  Filled 2023-12-07 (×3): qty 5

## 2023-12-07 MED ORDER — POTASSIUM CHLORIDE CRYS ER 20 MEQ PO TBCR
20.0000 meq | EXTENDED_RELEASE_TABLET | Freq: Once | ORAL | Status: AC
  Administered 2023-12-07: 20 meq via ORAL
  Filled 2023-12-07: qty 1

## 2023-12-07 MED ORDER — MENTHOL 3 MG MT LOZG
1.0000 | LOZENGE | OROMUCOSAL | Status: DC | PRN
Start: 2023-12-07 — End: 2023-12-09
  Administered 2023-12-07: 3 mg via ORAL
  Filled 2023-12-07 (×2): qty 9

## 2023-12-07 MED ORDER — POLYETHYLENE GLYCOL 3350 17 G PO PACK: 17.0000 g | PACK | Freq: Every day | ORAL | Status: DC | PRN

## 2023-12-07 MED ORDER — FERROUS SULFATE 325 (65 FE) MG PO TABS
325.0000 mg | ORAL_TABLET | Freq: Every day | ORAL | Status: DC
  Administered 2023-12-08 – 2023-12-09 (×2): 325 mg via ORAL
  Filled 2023-12-07 (×2): qty 1

## 2023-12-07 MED ORDER — ATORVASTATIN CALCIUM 40 MG PO TABS
40.0000 mg | ORAL_TABLET | Freq: Every day | ORAL | Status: DC
  Administered 2023-12-07 – 2023-12-08 (×2): 40 mg via ORAL
  Filled 2023-12-07 (×2): qty 1

## 2023-12-07 MED ORDER — SODIUM CHLORIDE 0.9% IV SOLUTION: Freq: Once | INTRAVENOUS | Status: AC

## 2023-12-07 MED ORDER — TRAZODONE HCL 50 MG PO TABS
100.0000 mg | ORAL_TABLET | Freq: Every day | ORAL | Status: DC
  Administered 2023-12-07 – 2023-12-08 (×2): 100 mg via ORAL
  Filled 2023-12-07 (×2): qty 2

## 2023-12-07 MED ORDER — VITAMIN B-12 1000 MCG PO TABS
1000.0000 ug | ORAL_TABLET | Freq: Every day | ORAL | Status: DC
  Administered 2023-12-07 – 2023-12-09 (×3): 1000 ug via ORAL
  Filled 2023-12-07 (×3): qty 1

## 2023-12-07 NOTE — H&P (Addendum)
 History and Physical    Kim [name] FMW:[id number] DOB: Nov 23, 1960 DOA: 12/06/2023  DOS: the patient was seen and examined on 12/06/2023  PCP: Scarlett Ronal Caldron, NP   Patient coming from: Home  I have personally briefly reviewed patient's old medical records in [facility] and CareEverywhere  HPI:   Kim [name] is a 63 y.o. year old female with past medical history of hypothyroidism, prior appendix cancer, ovarian cancer, depression, and anxiety.  She presents to [facility] with a several week history of progressively worsening dyspnea on exertion, generalized fatigue, exertional palpitations, and chest pressure with exertion/ambulation.  She endorses immobility and not frequently standing to walk around, nonproductive cough and rhinorrhea that began yesterday as well as subjective/low-grade fevers. She denies lower extremity swelling, recent long distance travel, prior history of PE/DVT, estrogen use, syncope, confusion, or diaphoresis.  ED Course: On arrival to [facility] patient was noted to be afebrile temp 36.9 C, BP 147/87, HR 94, RR 18, SpO2 95% on room air.  CXR obtained and negative for any acute cardiopulmonary process. CT chest abdomen pelvis with contrast obtained and shows pulmonary emboli in all lobes of the right lung as well as a left upper lobe without evidence of right heart strain.  Labs notable for D-dimer 4.76, hemoglobin 7.1, iron   <10, potassium 3.3. She was started on IV heparin  and given, 40 mEq p.o. K. TR[facility] contacted for admission.  Review of Systems: As mentioned in the history of present illness. All other systems reviewed and are negative.  Review of Systems  Constitutional:  Positive for fever and malaise/fatigue. Negative for chills and weight loss.  HENT:  Positive for congestion. Negative for sore throat.   Respiratory:  Positive for cough and shortness of breath. Negative for sputum production and wheezing.   Cardiovascular:  Positive for  palpitations.  Gastrointestinal:  Negative for abdominal pain, constipation, diarrhea, nausea and vomiting.  Genitourinary:  Negative for dysuria.  Musculoskeletal:  Negative for myalgias.  Neurological:  Positive for headaches. Negative for dizziness, focal weakness and weakness.  Endo/Heme/Allergies:  Does not bruise/bleed easily.    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   Depression    GERD (gastroesophageal reflux disease)    Headache(784.0)    migraines   Herpes 1986   on patients back side since 1986    Past Surgical History:  Procedure Laterality Date   ABDOMINAL HYSTERECTOMY N/A 03/12/2016   Procedure: HYSTERECTOMY ABDOMINAL TOTAL , OMENTECTOMY, RADICAL TUMOR DEBULKING;  Surgeon: Maurilio Ship, MD;  Location: [facility];  Service: Gynecology;  Laterality: N/A;   ABDOMINAL SURGERY     for cancer   APPENDECTOMY  03/12/2016   Procedure: APPENDECTOMY;  Surgeon: Maurilio Ship, MD;  Location: [facility];  Service: Gynecology;;   FOOT SURGERY Right    correction old fracture last toe   FRACTURE SURGERY Right    patellar fracture   KNEE SURGERY     right   LAPAROTOMY N/A 03/12/2016   Procedure: EXPLORATORY LAPAROTOMY;  Surgeon: Maurilio Ship, MD;  Location: [facility];  Service: Gynecology;  Laterality: N/A;   MOUTH SURGERY     SALPINGOOPHORECTOMY Bilateral 03/12/2016   Procedure: BILATERAL SALPINGO OOPHORECTOMY;  Surgeon: Maurilio Ship, MD;  Location: [facility];  Service: Gynecology;  Laterality: Bilateral;   tummy tuck     uterine ablation  reports that she has never smoked. She has never used smokeless tobacco. She reports that she does not currently use drugs after having used the following drugs: Cocaine. She reports that she does not drink alcohol.  Allergies  Allergen Reactions   Morphine  And Codeine Nausea And Vomiting   Penicillins     Childhood reaction Has  patient had a PCN reaction causing immediate rash, facial/tongue/throat swelling, SOB or lightheadedness with hypotension:unsure Has patient had a PCN reaction causing severe rash involving mucus membranes or skin necrosis:unsure Has patient had a PCN reaction that required hospitalization:No Has patient had a PCN reaction occurring within the last 10 years: No If all of the above answers are NO, then may proceed with Cephalosporin use.     Family History  Problem Relation Age of Onset   High Cholesterol Mother    Pancreatic cancer Mother    Heart attack Father    Heart disease Father    Breast cancer Neg Hx    BRCA 1/2 Neg Hx     Prior to Admission medications   Medication Sig Start Date End Date Taking? Authorizing Provider  aspirin-acetaminophen -caffeine (EXCEDRIN MIGRAINE) 250-250-65 MG tablet Take 2 tablets by mouth daily as needed for headache or migraine.    [provider]  atorvastatin  (LIPITOR) 20 MG tablet Take 20 mg by mouth at bedtime. 02/17/16   [provider]  buPROPion  (WELLBUTRIN  XL) 150 MG 24 hr tablet Take 150 mg by mouth daily.    [provider]  buPROPion  (WELLBUTRIN  XL) 300 MG 24 hr tablet Take 450 mg by mouth every morning.  02/03/16   [provider]  desvenlafaxine (PRISTIQ) 50 MG 24 hr tablet Take 50 mg by mouth every morning. 02/03/16   [provider]  lamoTRIgine  (LAMICTAL ) 200 MG tablet Take 200 mg by mouth at bedtime. 02/03/16   [provider]  senna-docusate (SENOKOT-S) 8.6-50 MG tablet Take 2 tablets by mouth at bedtime. For constipation 02/28/16   Svalina, Gorica, MD  traZODone  (DESYREL ) 50 MG tablet Take 50 mg by mouth at bedtime.    [provider]    Physical Exam: Vitals:   12/06/23 1915 12/06/23 1930 12/06/23 2223 12/07/23 0408  BP: 133/76 120/75 117/81   Pulse: (!) 102 97    Resp: (!) 25 15    Temp:   98.7 F (37.1 C) 98.6 F (37 C)  TempSrc:   Oral Oral  SpO2: 93% 93%     Weight:      Height:        Physical Exam Vitals and nursing note reviewed.  Constitutional:      General: She is not in acute distress. HENT:     Head: Normocephalic.  Cardiovascular:     Rate and Rhythm: Normal rate and regular rhythm.  Pulmonary:     Effort: Pulmonary effort is normal.     Breath sounds: Normal breath sounds.  Chest:     Chest wall: No tenderness.  Abdominal:     General: Bowel sounds are normal.     Palpations: Abdomen is soft.     Tenderness: There is no abdominal tenderness. There is no guarding.  Skin:    General: Skin is warm and dry.     Capillary Refill: Capillary refill takes less than 2 seconds.  Neurological:     General: No focal deficit present.     Mental Status: She is alert and oriented to person, place, and time.      Labs  on Admission: I have personally reviewed following labs and imaging studies  CBC: Recent Labs  Lab 12/06/23 1349 12/07/23 0210  WBC 7.9 12.7*  NEUTROABS  --  8.4*  HGB 7.1* 6.8*  HCT 24.8* 22.9*  MCV 79.5* 78.2*  PLT 366 352   Basic Metabolic Panel: Recent Labs  Lab 12/06/23 1349 12/07/23 0201  NA 138 137  K 3.3* 3.4*  CL 104 104  CO2 21* 21*  GLUCOSE 98 129*  BUN 10 12  CREATININE 0.64 0.78  CALCIUM  9.0 8.4*  MG  --  1.9   GFR: Estimated Creatinine Clearance: 60.9 mL/min (by C-G formula based on SCr of 0.78 mg/dL). Liver Function Tests: Recent Labs  Lab 12/06/23 1630 12/07/23 0201  AST 26 19  ALT 14 13  ALKPHOS 102 75  BILITOT 0.2 0.4  PROT 6.9 5.9*  ALBUMIN  4.4 3.3*   No results for input(s): LIPASE, AMYLASE in the last 168 hours. No results for input(s): AMMONIA in the last 168 hours. Coagulation Profile: No results for input(s): INR, PROTIME in the last 168 hours. Cardiac Enzymes: No results for input(s): CKTOTAL, CKMB, CKMBINDEX, TROPONINI, TROPONINIHS in the last 168 hours. BNP (last 3 results) No results for input(s): BNP in the last 8760  hours. HbA1C: No results for input(s): HGBA1C in the last 72 hours. CBG: No results for input(s): GLUCAP in the last 168 hours. Lipid Profile: No results for input(s): CHOL, HDL, LDLCALC, TRIG, CHOLHDL, LDLDIRECT in the last 72 hours. Thyroid Function Tests: No results for input(s): TSH, T4TOTAL, FREET4, T3FREE, THYROIDAB in the last 72 hours. Anemia Panel: Recent Labs    12/06/23 1630  VITAMINB12 273  FOLATE 19.4  FERRITIN 17  TIBC 480*  IRON  <10*  RETICCTPCT 2.7   Urine analysis:    Component Value Date/Time   COLORURINE YELLOW 12/06/2023 1640   APPEARANCEUR CLEAR 12/06/2023 1640   LABSPEC 1.022 12/06/2023 1640   PHURINE 5.5 12/06/2023 1640   GLUCOSEU NEGATIVE 12/06/2023 1640   HGBUR NEGATIVE 12/06/2023 1640   BILIRUBINUR NEGATIVE 12/06/2023 1640   KETONESUR NEGATIVE 12/06/2023 1640   PROTEINUR NEGATIVE 12/06/2023 1640   UROBILINOGEN 0.2 11/05/2011 1541   NITRITE NEGATIVE 12/06/2023 1640   LEUKOCYTESUR MODERATE (A) 12/06/2023 1640    Radiological Exams on Admission: I have personally reviewed images CT CHEST ABDOMEN PELVIS W CONTRAST Result Date: 12/06/2023 CLINICAL DATA:  Anemia, GI bleed. Shortness of breath. Heart racing. EXAM: CT CHEST, ABDOMEN, AND PELVIS WITH CONTRAST TECHNIQUE: Multidetector CT imaging of the chest, abdomen and pelvis was performed following the standard protocol during bolus administration of intravenous contrast. RADIATION DOSE REDUCTION: This exam was performed according to the departmental dose-optimization program which includes automated exposure control, adjustment of the mA and/or kV according to patient size and/or use of iterative reconstruction technique. CONTRAST:  OMNIPAQUE  IOHEXOL  300 MG/ML  SOLN COMPARISON:  None Available. FINDINGS: CT CHEST FINDINGS Cardiovascular: Heart is normal size. Aorta is normal caliber. Filling defects noted within multiple right pulmonary arteries in all lobes of the right lung  compatible with pulmonary emboli. Suspect small subsegmental pulmonary embolus in the left upper lobe. No evidence of right heart strain. Mediastinum/Nodes: Moderate-sized hiatal hernia. No mediastinal, hilar, or axillary adenopathy. Trachea and esophagus are unremarkable. Thyroid unremarkable. Lungs/Pleura: Linear areas of scarring in the lingula and right middle lobe as well as lung bases/lower lobes. No acute confluent opacities or effusions. Musculoskeletal: Chest wall soft tissues are unremarkable. No acute bony abnormality. CT ABDOMEN PELVIS FINDINGS Hepatobiliary: No focal  hepatic abnormality. Gallbladder unremarkable. Pancreas: No focal abnormality or ductal dilatation. Spleen: No focal abnormality.  Normal size. Adrenals/Urinary Tract: No adrenal abnormality. No focal renal abnormality. No stones or hydronephrosis. Urinary bladder is unremarkable. Stomach/Bowel: Stomach, large and small bowel grossly unremarkable. Vascular/Lymphatic: No evidence of aneurysm or adenopathy. Reproductive: Prior hysterectomy.  No adnexal masses. Other: No free fluid or free air. Musculoskeletal: No acute bony abnormality. IMPRESSION: Incidental pulmonary emboli noted in all lobes of the right lung and left upper lobe. No evidence of right heart strain. Moderate-sized hiatal hernia. No acute findings in the abdomen or pelvis. These results were called by telephone at the time of interpretation on 12/06/2023 at 5:10 pm to provider [facility] , who verbally acknowledged these results. Electronically Signed   By: Franky Crease M.D.   On: 12/06/2023 17:11   DG Chest 2 View Result Date: 12/06/2023 CLINICAL DATA:  Shortness of breath EXAM: CHEST - 2 VIEW COMPARISON:  June 28, 2023 cardiac CT FINDINGS: Linear left basilar opacity likely representing atelectasis. No pleural effusions or pneumothorax. Likely hiatal hernia.  No acute osseous findings. IMPRESSION: Hiatal hernia.  No acute airspace disease. Electronically Signed   By:  Michaeline Blanch M.D.   On: 12/06/2023 14:26    EKG: My personal interpretation of EKG shows: Normal sinus rhythm, HR 91   Assessment/Plan Principal Problem:   Pulmonary embolism (HCC)    # Bilateral pulmonary emboli without (R) Heart Strain CT chest abdomen pelvis with contrast: Pulmonary emboli noted in all lobes of the right lung and left upper lobe PESI: 95, Class III Intermediate Risk Symptoms: Dyspnea on exertion, chest pressure, palpitations, Tachycardia: HR 102 O2 requirement: Room air SPO2:91% - Continue heparin  drip, trend H&H as below - ECHO - (B)LE DVT Study - Check Troponin -  Check BNP  #Rectal bleeding #Iron  Deficiency anemia - Hold pharmacologic VTE prophylaxis - Trend H&H, transfuse for HGB goal > 7 or if hemodynamically unstable - IV PPI - Check PT/INR - Start PO Iron  supplementation - [facility] consulted, appreciate their management and recommendations  #Hypothyroidism -Continue home Synthroid   #Hyperlipidemia -Continue home Lipitor  #Bipolar -Continue home Zoloft , BuSpar , and trazodone    VTE prophylaxis:  IV heparin  gtts  GI prophylaxis: Protonix   Diet: Regular Access: PIV Lines: None Code Status:  Full Code Telemetry: Yes  Admission status: Inpatient, Telemetry bed Patient is from: Home Anticipated d/c is to: Home Anticipated d/c date is: 2-3 days Patient currently: Receiving IV heparin , pending echo, US  DVT, and evaluation by pulmonary/critical care medicine for thrombolytics   Family Communication:  No family at bedside. Plan of care discussed with patient at bedside.  Questions welcomed and elicited.  Questions answered to patient's expressed satisfaction.  Patient is cognitively able to update family independently if desired.  Consults called: Leita Gleason, NP, Pulmonary critical care medicine   Severity of Illness: The appropriate patient status for this patient is INPATIENT. Inpatient status is judged to be reasonable and necessary  in order to provide the required intensity of service to ensure the patient's safety. The patient's presenting symptoms, physical exam findings, and initial radiographic and laboratory data in the context of their chronic comorbidities is felt to place them at high risk for further clinical deterioration. Furthermore, it is not anticipated that the patient will be medically stable for discharge from the hospital within 2 midnights of admission.   * I certify that at the point of admission it is my clinical judgment that the patient will require  inpatient hospital care spanning beyond 2 midnights from the point of admission due to high intensity of service, high risk for further deterioration and high frequency of surveillance required.*  To reach the provider On-Call:   7AM- 7PM see care teams to locate the attending and reach out to them via [email or web address]. Password: [email or web address] 7PM-7AM contact night-coverage If you still have difficulty reaching the appropriate provider, please page the [employer] (Director on Call) for [facility] on amion for assistance  This document was prepared using Conservation officer, historic buildings and may include unintentional dictation errors.  Rockie Rams FNP-BC, PMHNP-BC Nurse Practitioner [facility]

## 2023-12-07 NOTE — Hospital Course (Addendum)
 63 y.o. 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  In the ED vitals stable although mild tachycardia, saturating well on room air.  Labs with hypokalemia mild normal LFTs troponin negative x 2 iron  deficiency with hemoglobin 7.1 D-dimer elevated 4.7 UA with WBC 11-20 urine culture obtained, FOBT was done and negative. Underwent CT chest on pelvis due to history of cancer>> incidental multiple pulmonary emboli all lobes of right lung and left upper lobe no right heart distant, moderate hiatal hernia no acute finding in abdomen and pelvis Heparin  drip was initiated and patient admitted. Hemoglobin was downtrending  7.1> 6.8 g,  1 PRBC ordered. On my exam appears comfortable on room air alert oriented No chest pain nausea or vomiting  A/P Dyspnea on exertion/fatigue: Suspect multifactorial due to patient's anemia as well as PE.  Symptomatic anemia: FOBT negative, and renal function with severe iron  deficiency.  Transfuse to keep below 7 g.  Very difficult to send given patient's need for anticoagulation in the setting of anemia.  Fortunately Hemoccult negative, although there is report of streak blood with wiping. She has been having epigastric fullness after eating, has used nsaid heavily a month ago for tooth abscess. Will benefit with GI evaluation, messaged Dr Albertus  Multiple PE right lung and left upper lobe: Troponin has been negative no evidence of right heart strain.  Vital stable, will continue heparin  cautiously, check echo. 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

## 2023-12-07 NOTE — Plan of Care (Signed)

## 2023-12-07 NOTE — Consult Note (Signed)
 NAME:  Kim [name], MRN:  [id number], DOB:  Jun 20, 1960, LOS: 1 ADMISSION DATE:  12/06/2023, CONSULTATION DATE:  12/07/23 REFERRING MD:  Dolan, CHIEF COMPLAINT:  Shortness of breath    History of Present Illness:  Kim [name] is a 63 y.o. F with PMH significant for prior low grade appendiceal Ca s/p laparotomy, partial cystectomy, omenectomy and chemotherapy now in remission, GERD, bipolar disorder who presented with shortness of breath and fatigue for about two weeks and was found to have multiple pulmonary emboli in all lobes of the R lung and LUL without evidence of R heart strain.  Troponin was negative, BNP and lactic acid are pending.  Pt has had HR in the low 100's, no O2 requirement and stable BP.  She was started on heparin  and PCCM consulted for consideration of lytics   Pertinent  Medical History   has a past medical history of ADHD (attention deficit hyperactivity disorder), Bipolar disorder (HCC), Cancer of abdominal wall, Depression, GERD (gastroesophageal reflux disease), Headache(784.0), and Herpes (1986).   Significant Hospital Events: Including procedures, antibiotic start and stop dates in addition to other pertinent events   9/23 PCCM consult for PE  Interim History / Subjective:  As above  Hgb 6.8 getting blood transfusion, denies GIB sx's   Objective    Blood pressure 109/70, pulse (!) 119, temperature 98.9 F (37.2 C), temperature source Oral, resp. rate 17, height 5' 1 (1.549 m), weight 65.8 kg, SpO2 92%.        Intake/Output Summary (Last 24 hours) at 12/07/2023 1219 Last data filed at 12/07/2023 0500 Gross per 24 hour  Intake 600 ml  Output --  Net 600 ml   Filed Weights   12/06/23 1343  Weight: 65.8 kg    General:  well nourished F resting in bed in NAD HEENT: MM pink/moist Neuro: alert and oriented  CV: s1s2 rrr, no m/r/g PULM:  clear bilaterally on RA with mild tachypnea  GI: soft, bsx4 active  Extremities: warm/dry, no edema      Resolved problem list   Assessment and Plan     Acute bilateral PE No R heart strain on echo or CTA BP and respiratory status are stable  Trop negative -would recommend continuing heparin , given stability and lack of RH strain along with anemia would not pursue lytics at this time, no role for IR consult -still having some dyspnea, LE dopplers are pending -supportive care, transition to DOAC when ready for discharge  -PCCM will sign off but are available as needed     Labs   CBC: Recent Labs  Lab 12/06/23 1349 12/07/23 0210 12/07/23 0931  WBC 7.9 12.7*  --   NEUTROABS  --  8.4*  --   HGB 7.1* 6.8* 6.5*  HCT 24.8* 22.9* 22.2*  MCV 79.5* 78.2*  --   PLT 366 352  --     Basic Metabolic Panel: Recent Labs  Lab 12/06/23 1349 12/07/23 0201  NA 138 137  K 3.3* 3.4*  CL 104 104  CO2 21* 21*  GLUCOSE 98 129*  BUN 10 12  CREATININE 0.64 0.78  CALCIUM  9.0 8.4*  MG  --  1.9   GFR: Estimated Creatinine Clearance: 60.9 mL/min (by C-G formula based on SCr of 0.78 mg/dL). Recent Labs  Lab 12/06/23 1349 12/07/23 0210  WBC 7.9 12.7*    Liver Function Tests: Recent Labs  Lab 12/06/23 1630 12/07/23 0201  AST 26 19  ALT 14 13  ALKPHOS 102 75  BILITOT 0.2 0.4  PROT 6.9 5.9*  ALBUMIN  4.4 3.3*   No results for input(s): LIPASE, AMYLASE in the last 168 hours. No results for input(s): AMMONIA in the last 168 hours.  ABG No results found for: PHART, PCO2ART, PO2ART, HCO3, TCO2, ACIDBASEDEF, O2SAT   Coagulation Profile: No results for input(s): INR, PROTIME in the last 168 hours.  Cardiac Enzymes: No results for input(s): CKTOTAL, CKMB, CKMBINDEX, TROPONINI in the last 168 hours.  HbA1C: No results found for: HGBA1C  CBG: No results for input(s): GLUCAP in the last 168 hours.  Review of Systems:   Please see the history of present illness. All other systems reviewed and are negative    Past Medical History:   She,  has a past medical history of ADHD (attention deficit hyperactivity disorder), Bipolar disorder (HCC), Cancer of abdominal wall, Depression, GERD (gastroesophageal reflux disease), Headache(784.0), and Herpes (1986).   Surgical History:   Past Surgical History:  Procedure Laterality Date   ABDOMINAL HYSTERECTOMY N/A 03/12/2016   Procedure: HYSTERECTOMY ABDOMINAL TOTAL , OMENTECTOMY, RADICAL TUMOR DEBULKING;  Surgeon: Maurilio Ship, MD;  Location: [facility];  Service: Gynecology;  Laterality: N/A;   ABDOMINAL SURGERY     for cancer   APPENDECTOMY  03/12/2016   Procedure: APPENDECTOMY;  Surgeon: Maurilio Ship, MD;  Location: [facility];  Service: Gynecology;;   FOOT SURGERY Right    correction old fracture last toe   FRACTURE SURGERY Right    patellar fracture   KNEE SURGERY     right   LAPAROTOMY N/A 03/12/2016   Procedure: EXPLORATORY LAPAROTOMY;  Surgeon: Maurilio Ship, MD;  Location: [facility];  Service: Gynecology;  Laterality: N/A;   MOUTH SURGERY     SALPINGOOPHORECTOMY Bilateral 03/12/2016   Procedure: BILATERAL SALPINGO OOPHORECTOMY;  Surgeon: Maurilio Ship, MD;  Location: [facility];  Service: Gynecology;  Laterality: Bilateral;   tummy tuck     uterine ablation       Social History:   reports that she has never smoked. She has never used smokeless tobacco. She reports that she does not currently use drugs after having used the following drugs: Cocaine. She reports that she does not drink alcohol.   Family History:  Her family history includes Heart attack in her father; Heart disease in her father; High Cholesterol in her mother; Pancreatic cancer in her mother. There is no history of Breast cancer or BRCA 1/2.   Allergies Allergies  Allergen Reactions   Morphine  And Codeine Nausea And Vomiting   Penicillins Other (See Comments)    Unknown childhood reaction     Home Medications  Prior to Admission medications   Medication Sig Start Date End Date Taking? Authorizing Provider   escitalopram (LEXAPRO) 20 MG tablet Take 20 mg by mouth daily.   Yes [provider]  levothyroxine  (SYNTHROID ) 25 MCG tablet Take 25 mcg by mouth daily before breakfast.   Yes [provider]  sertraline  (ZOLOFT ) 50 MG tablet Take 50 mg by mouth daily.   Yes [provider]  traZODone  (DESYREL ) 100 MG tablet Take 200 mg by mouth at bedtime.   Yes [provider]  atorvastatin  (LIPITOR) 80 MG tablet Take 40 mg by mouth at bedtime.    [provider]  busPIRone  (BUSPAR ) 15 MG tablet Take 15 mg by mouth 3 (three) times daily.    [provider]  cariprazine (VRAYLAR) 1.5 MG capsule Take by mouth.    [provider]     Critical care  time: n/a     Leita SAUNDERS Yadir Zentner, PA-C [facility] See Amion for pager If no response to pager , please call [phone] until 7pm After 7:00 pm call Elink  [phone]

## 2023-12-07 NOTE — Progress Notes (Signed)
  Echocardiogram 2D Echocardiogram has been performed.  Kim [name] 12/07/2023, 1:19 PM

## 2023-12-07 NOTE — Progress Notes (Signed)
 PT Cancellation Note  Patient Details Name: Kim [name] MRN: [id number] DOB: Feb 16, 1961   Cancelled Treatment:    Reason Eval/Treat Not Completed: Patient at procedure or test/unavailable (RN about to start blood transfusion. Requests PT to hold at this time.)   Maisey Deandrade 12/07/2023, 10:02 AM

## 2023-12-07 NOTE — Evaluation (Signed)
 Physical Therapy Brief Evaluation and Discharge Note Patient Details Name: Kim [name] MRN: [id number] DOB: 1960-11-25 Today's Date: 12/07/2023   History of Present Illness  Kim [name] is a 63 y.o. year old female who presents to [facility] with a several week history of progressively worsening dyspnea on exertion, generalized fatigue, exertional palpitations, and chest pressure with exertion/ambulation.  Past medical history of hypothyroidism, prior appendix cancer, ovarian cancer, depression, and anxiety.  Clinical Impression  Pt presents with admitting diagnosis above. Pt today was able to ambulate short distance in hallway independently with no AD however session was cut short due to transport arriving to take pt to vascular. SpO2 briefly dropped to 88% however quickly recovered to 95% on RA once seated. PTA pt was fully independent. Pt presents at or near baseline mobility. Pt has no further acute PT needs and will be signing off. PT will continue to follow.        PT Assessment Patient does not need any further PT services  Assistance Needed at Discharge  PRN    Equipment Recommendations None recommended by PT  Recommendations for Other Services       Precautions/Restrictions Precautions Precautions: Fall Recall of Precautions/Restrictions: Intact Restrictions Weight Bearing Restrictions Per Provider Order: No        Mobility  Bed Mobility   Supine/Sidelying to sit: Independent Sit to supine/sidelying: Independent    Transfers Overall transfer level: Independent Equipment used: None                    Ambulation/Gait Ambulation/Gait assistance: Independent Gait Distance (Feet): 100 Feet Assistive device: None Gait Pattern/deviations: WFL(Within Functional Limits) Gait Speed: Pace WFL General Gait Details: no LOB noted. Distance limited as transport arrived to take patient to vascular.  Home Activity Instructions    Stairs             Modified Rankin (Stroke Patients Only)        Balance Overall balance assessment: Independent                        Pertinent Vitals/Pain PT - Brief Vital Signs All Vital Signs Stable: Yes Pain Assessment Pain Assessment: 0-10 Pain Score: 4  Pain Location: Eyes Pain Descriptors / Indicators: Aching Pain Intervention(s): Monitored during session     Home Living Family/patient expects to be discharged to:: Private residence Living Arrangements: Non-relatives/[employer]) Available Help at Discharge: Available 24 hours/day Home Environment: Stairs to enter  [employer] of Steps: 2 Home Equipment: None        Prior Function Level of Independence: Independent      UE/LE Assessment   UE ROM/Strength/Tone/Coordination: WFL    LE ROM/Strength/Tone/Coordination: [facility]      Communication   Communication Communication: No apparent difficulties     Cognition Overall Cognitive Status: Appears within functional limits for tasks assessed/performed       General Comments General comments (skin integrity, edema, etc.): SpO2 briefly dropped to 88% however quickly recovered to 95% on RA once seated.    Exercises     Assessment/Plan    PT Problem List         PT Visit Diagnosis Other abnormalities of gait and mobility (R26.89)    No Skilled PT Patient at baseline level of functioning;Patient is independent with all acitivity/mobility   Co-evaluation                AMPAC 6 Clicks Help needed turning from your  back to your side while in a flat bed without using bedrails?: None Help needed moving from lying on your back to sitting on the side of a flat bed without using bedrails?: None Help needed moving to and from a bed to a chair (including a wheelchair)?: None Help needed standing up from a chair using your arms (e.g., wheelchair or bedside chair)?: None Help needed to walk in hospital room?: None Help needed climbing 3-5 steps with a  railing? : None 6 Click Score: 24      End of Session Equipment Utilized During Treatment: Gait belt Activity Tolerance: Patient tolerated treatment well Patient left: in bed;with call bell/phone within reach;Other (comment) (transport arriving to take patient to vascular) Nurse Communication: Mobility status PT Visit Diagnosis: Other abnormalities of gait and mobility (R26.89)     Time: 8562-8552 PT Time Calculation (min) (ACUTE ONLY): 10 min  Charges:   PT Evaluation $PT Eval Low Complexity: 1 Low      Kim [name], PT, DPT Acute Rehab Services [phone]   Kim Buddle  12/07/2023, 4:40 PM

## 2023-12-07 NOTE — Progress Notes (Signed)
 PHARMACY - ANTICOAGULATION CONSULT NOTE  Pharmacy Consult for Heparin  Indication: pulmonary embolus  Allergies  Allergen Reactions   Morphine  And Codeine Nausea And Vomiting   Penicillins     Childhood reaction Has patient had a PCN reaction causing immediate rash, facial/tongue/throat swelling, SOB or lightheadedness with hypotension:unsure Has patient had a PCN reaction causing severe rash involving mucus membranes or skin necrosis:unsure Has patient had a PCN reaction that required hospitalization:No Has patient had a PCN reaction occurring within the last 10 years: No If all of the above answers are NO, then may proceed with Cephalosporin use.     Patient Measurements: Height: 5' 1 (154.9 cm) Weight: 65.8 kg (145 lb) IBW/kg (Calculated) : 47.8 HEPARIN  DW (KG): 61.6  Vital Signs: Temp: 99.1 F (37.3 C) (09/23 1018) Temp Source: Oral (09/23 0934) BP: 127/67 (09/23 1018) Pulse Rate: 100 (09/23 1018)  Labs: Recent Labs    12/06/23 1349 12/07/23 0201 12/07/23 0210 12/07/23 0931  HGB 7.1*  --  6.8* 6.5*  HCT 24.8*  --  22.9* 22.2*  PLT 366  --  352  --   HEPARINUNFRC  --   --  0.64 0.53  CREATININE 0.64 0.78  --   --     Estimated Creatinine Clearance: 60.9 mL/min (by C-G formula based on SCr of 0.78 mg/dL).  Assessment: Pt is a 63yoF with PMH of appendiceal cancer and carcinomatosis. Pt presents with SOB on exertion. CT Chest from 9/22 reveals PE with no right heart strain. Pt is not on anticoagulation prior to presentation. Pt also reports streaky blood with wiping but no overt rectal bleeding.   Hgb 7.1, PLT WNL  Heparin  level therapeutic x 1  Goal of Therapy:  Heparin  level 0.3-0.7 units/ml Monitor platelets by anticoagulation protocol: Yes   Plan:  Continue heparin  drip at 1000 units/hr Confirmatory heparin  level in 6h Daily heparin  level, CBC Monitor for s/sx of bleeding  Thank you. Olam Monte, PharmD 12/07/2023 11:23 AM

## 2023-12-07 NOTE — Progress Notes (Addendum)
 9688: Pt Hgb 6.8 with morning labs. RN messaged on-call Dr. Charlton. New orders placed for transfusion in. RN will follow up   0450: pending type and screen to be checked. Consent signed and in chart. Pt educated on blood transfusion complications, pt verbalized understanding. RN will follow up   0600: pt still pending type and screen to be done for transfusion. RN will follow up

## 2023-12-07 NOTE — Telephone Encounter (Signed)
 Patient Product/process development scientist completed.    The patient is insured through [facility]. Patient has Medicare and is not eligible for a copay card, but may be able to apply for patient assistance or Medicare RX Payment Plan (Patient Must reach out to their plan, if eligible for payment plan), if available.    Ran test claim for Eliquis  5 mg and the current 30 day co-pay is $0.00.   This test claim was processed through [facility]- copay amounts may vary at other pharmacies due to pharmacy/plan contracts, or as the patient moves through the different stages of their insurance plan.     Reyes Sharps, CPHT Pharmacy Technician III Certified Patient Advocate [facility] Patient Advocate Team Direct Number: [phone]  Fax: [phone]

## 2023-12-07 NOTE — Progress Notes (Signed)
 PHARMACY - ANTICOAGULATION CONSULT NOTE  Pharmacy Consult for Heparin  Indication: pulmonary embolus  Allergies  Allergen Reactions   Morphine  And Codeine Nausea And Vomiting   Penicillins     Childhood reaction Has patient had a PCN reaction causing immediate rash, facial/tongue/throat swelling, SOB or lightheadedness with hypotension:unsure Has patient had a PCN reaction causing severe rash involving mucus membranes or skin necrosis:unsure Has patient had a PCN reaction that required hospitalization:No Has patient had a PCN reaction occurring within the last 10 years: No If all of the above answers are NO, then may proceed with Cephalosporin use.     Patient Measurements: Height: 5' 1 (154.9 cm) Weight: 65.8 kg (145 lb) IBW/kg (Calculated) : 47.8 HEPARIN  DW (KG): 61.6  Vital Signs: Temp: 98.7 F (37.1 C) (09/22 2223) Temp Source: Oral (09/22 2223) BP: 117/81 (09/22 2223) Pulse Rate: 97 (09/22 1930)  Labs: Recent Labs    12/06/23 1349 12/07/23 0201 12/07/23 0210  HGB 7.1*  --  6.8*  HCT 24.8*  --  22.9*  PLT 366  --  352  HEPARINUNFRC  --   --  0.64  CREATININE 0.64 0.78  --     Estimated Creatinine Clearance: 60.9 mL/min (by C-G formula based on SCr of 0.78 mg/dL).  Assessment: Pt is a 63yoF with PMH of appendiceal cancer and carcinomatosis. Pt presents with SOB on exertion. CT Chest from 9/22 reveals PE with no right heart strain. Pt is not on anticoagulation prior to presentation. Pt also reports streaky blood with wiping but no overt rectal bleeding.   Hgb 7.1, PLT WNL  Heparin  level is therapeutic at 0.64 on 1000 units/hr. No bleeding noted, FOBT neg, Hgb 7.1>> 6.8 - MD ordered 1 unit RBC, platelets are normal.   Goal of Therapy:  Heparin  level 0.3-0.7 units/ml Monitor platelets by anticoagulation protocol: Yes   Plan:  Continue heparin  drip at 1000 units/hr Confirmatory heparin  level in 6h Daily heparin  level, CBC Monitor for s/sx of  bleeding  Thank you for involving pharmacy in this patient's care.  Delon Sax, PharmD, BCPS Clinical Pharmacist 12/07/2023 3:48 AM

## 2023-12-07 NOTE — Progress Notes (Signed)
 Hgb is 6.8. FOBT was negative. Plan to transfuse 1 unit RBCs and then repeat CBC.

## 2023-12-07 NOTE — Progress Notes (Incomplete)
 BLE venous duplex has been completed.   Results can be found under chart review under CV PROC. 12/07/2023 3:33 PM Ndia Sampath RVT, RDMS

## 2023-12-07 NOTE — TOC Initial Note (Signed)
 Transition of Care [facility]) - Initial/Assessment Note    Patient Details  Name: Kim [name] MRN: [id number] Date of Birth: 1960/04/30  [facility] CM/SW Contact:    Marval Gell, RN Phone Number: 12/07/2023, 7:44 AM  Clinical Narrative:                  Patient admitted from home, lives w spouse. Independent.  PE, heparin  gtt.  TOC will continue to follow.  [employer] Dual coverage, Primary [employer].  Expected Discharge Plan: Home/Self Care Barriers to Discharge: Continued Medical Work up   Patient Goals and CMS Choice Patient states their goals for this hospitalization and ongoing recovery are:: to go home          Expected Discharge Plan and Services       Living arrangements for the past 2 months: Single Family Home                                      Prior Living Arrangements/Services Living arrangements for the past 2 months: Single Family Home Lives with:: Spouse                   Activities of Daily Living   ADL Screening (condition at time of admission) Independently performs ADLs?: Yes (appropriate for developmental age) Is the patient deaf or have difficulty hearing?: No Does the patient have difficulty seeing, even when wearing glasses/contacts?: Yes Does the patient have difficulty concentrating, remembering, or making decisions?: No  Permission Sought/Granted                  Emotional Assessment              Admission diagnosis:  Pulmonary embolism (HCC) [I26.99] Anemia, unspecified type [D64.9] Multiple subsegmental pulmonary emboli without acute cor pulmonale (HCC) [I26.94] Patient Active Problem List   Diagnosis Date Noted   Pulmonary embolism (HCC) 12/06/2023   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 [place] [address] Phone: [phone] Fax: [phone]  [facility] [address] [place] [address] Phone: [phone] Fax: [phone]     Social Drivers of Health (SDOH) Social History: SDOH Screenings   Food Insecurity: Unknown (12/06/2023)  Housing: Low Risk  (12/06/2023)  Transportation Needs: No Transportation Needs (12/06/2023)  Utilities: Not At Risk (12/06/2023)  Social Connections: Moderately Integrated (12/06/2023)  Tobacco Use: Low Risk  (12/06/2023)   SDOH Interventions:     Readmission Risk Interventions     No data to display

## 2023-12-07 NOTE — Consult Note (Addendum)
 Referring Provider: No ref. provider found Primary Care Physician:  Scarlett Ronal Caldron, NP Primary Gastroenterologist: Sampson  Reason for Consultation: Anemia  HPI: Kim [name] is a 63 y.o. 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 She presented to the ED 12/06/2023 with progressive generalized fatigue with dyspnea on exertion and mild chest pressure for the past few weeks.  Labs in the ED showed a WBC count of 7.9.  Hemoglobin 7.1.  Hematocrit 24.8.  MCV 79.5.  Platelets 336.  Potassium 3.3.  BUN 10.  Creatinine 0.64.  Normal LFTs.  Iron  < 10.  Ferritin 17.  Vitamin B12 level 273.  Folate 19.4.  Troponin < 15.  D-dimer 4.76.  FOBT negative.  Urinalysis with moderate leukocytes, culture pending.  Chest x-ray showed a hiatal hernia otherwise unremarkable.  Chest CT showed pulmonary emboli in all lobes of the right lung and left upper lobe with evidence of heart strain and a  moderate hiatal. CTAP without acute findings in the abdomen or pelvis. ECHO showed LVEF 70 to 75%, grade 1 diastolic dysfunction and RV function hyperdynamic.  Hemoglobin level dropped to 6.8 earlier this morning, 1 unit of PRBCs currently transfusing.  A GI consult was requested for further evaluation regarding IDA.  She endorses having progressive fatigue, DOE with heart thumping for the past 1 to 2 weeks. She denies having any nausea or vomiting.  No upper or lower abdominal pain. No dysphagia or GERD symptoms. She takes ASA 81 mg 1 tab every other day otherwise no other NSAID use. She typically passes a normal formed brown bowel movement daily. No bloody or black stools. She passed a brown formed stool a few minutes ago which I viewed in the commode.  She denies ever having a EGD.  She stated having a colonoscopy by GI at [facility] in 2010  and a second colonoscopy in 2018 which she reported were normal, no polyps.  Mother with history of colon polyps and pancreatic cancer.  She previously took oral iron  for approximately 8 months and follow-up labs by her PCP showed her iron  levels were high therefore she stopped taking iron  06/2023.  ECHO 12/07/2023:  Left ventricular ejection fraction, by estimation, is 70 to 75%. The left ventricle has hyperdynamic function. Left ventricular endocardial border not optimally defined to evaluate regional wall motion. Left ventricular diastolic parameters are consistent with Grade I diastolic dysfunction (impaired relaxation). Unable to assess A2C view due to patient discomfort. 1. Right ventricular systolic function is hyperdynamic. The right ventricular size is normal. 2. The mitral valve is grossly normal. No evidence of mitral valve regurgitation. No evidence of mitral stenosis. 3. The aortic valve was not well visualized. There is moderate calcification of the aortic valve. Aortic valve regurgitation is not visualized. Aortic valve sclerosis is present, with no evidence of aortic valve stenosis.  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   Depression    GERD (gastroesophageal reflux disease)    Headache(784.0)    migraines   Herpes 1986   on patients back side since 1986    Past Surgical History:  Procedure Laterality Date   ABDOMINAL HYSTERECTOMY N/A 03/12/2016   Procedure: HYSTERECTOMY ABDOMINAL TOTAL , OMENTECTOMY, RADICAL TUMOR DEBULKING;  Surgeon: Maurilio Ship, MD;  Location: [facility];  Service: Gynecology;  Laterality: N/A;   ABDOMINAL SURGERY     for cancer   APPENDECTOMY  03/12/2016   Procedure: APPENDECTOMY;  Surgeon: Maurilio Ship, MD;  Location: [facility];   Service: Gynecology;;   FOOT SURGERY Right    correction old fracture last toe   FRACTURE SURGERY Right    patellar fracture   KNEE SURGERY     right   LAPAROTOMY N/A 03/12/2016   Procedure: EXPLORATORY LAPAROTOMY;  Surgeon: Maurilio Ship, MD;  Location: [facility];  Service: Gynecology;  Laterality: N/A;   MOUTH SURGERY     SALPINGOOPHORECTOMY Bilateral 03/12/2016   Procedure: BILATERAL SALPINGO OOPHORECTOMY;  Surgeon: Maurilio Ship, MD;  Location: [facility];  Service: Gynecology;  Laterality: Bilateral;   tummy tuck     uterine ablation      Prior to Admission medications   Medication Sig Start Date End Date Taking? Authorizing Provider  escitalopram (LEXAPRO) 20 MG tablet Take 20 mg by mouth daily.   Yes [provider]  levothyroxine  (SYNTHROID ) 25 MCG tablet Take 25 mcg by mouth daily before breakfast.   Yes [provider]  sertraline  (ZOLOFT ) 50 MG tablet Take 50 mg by mouth daily.   Yes [provider]  traZODone  (DESYREL ) 100 MG tablet Take 200 mg by mouth at bedtime.   Yes [provider]  atorvastatin  (LIPITOR) 80 MG tablet Take 40 mg by mouth at bedtime.    [provider]  busPIRone  (BUSPAR ) 15 MG tablet Take 15 mg by mouth 3 (three) times daily.    [provider]  cariprazine (VRAYLAR) 1.5 MG capsule Take by mouth.    [provider]    Current Facility-Administered Medications  Medication Dose Route Frequency Provider Last Rate Last Admin   acetaminophen  (TYLENOL ) tablet 650 mg  650 mg Oral Q6H PRN Howerter, Justin B, DO   650 mg at 12/07/23 9070   Or   acetaminophen  (TYLENOL ) suppository 650 mg  650 mg Rectal Q6H PRN Howerter, Justin B, DO       atorvastatin  (LIPITOR) tablet 40 mg  40 mg Oral QHS Foust, Katy L, NP       benzonatate  (TESSALON ) capsule 200 mg  200 mg Oral TID PRN Foust, Katy L, NP   200 mg at 12/07/23 1021   busPIRone  (BUSPAR ) tablet 15 mg  15 mg Oral BID Foust, Katy L, NP   15 mg at 12/07/23 0956    [START ON 12/08/2023] ferrous sulfate  tablet 325 mg  325 mg Oral Q breakfast Foust, Katy L, NP       guaiFENesin  (ROBITUSSIN) 100 MG/5ML liquid 5 mL  5 mL Oral Q4H PRN Opyd, Timothy S, MD   5 mL at 12/07/23 0929   heparin  ADULT infusion 100 units/mL (25000 units/250mL)  1,000 Units/hr Intravenous Continuous Dodson, Andrew J, RPH 10 mL/hr at 12/06/23 1854 1,000 Units/hr at 12/06/23 1854   levothyroxine  (SYNTHROID ) tablet 25 mcg  25 mcg Oral Q0600 Foust, Katy L, NP   25 mcg at 12/07/23 0956   menthol  (CEPACOL) lozenge 3 mg  1 lozenge Oral PRN Opyd, Timothy S, MD   3 mg at 12/07/23 0457   ondansetron  (ZOFRAN ) injection 4 mg  4 mg Intravenous Q6H PRN Howerter, Justin B, DO       pantoprazole  (PROTONIX ) EC tablet 40  mg  40 mg Oral Daily Foust, Katy L, NP   40 mg at 12/07/23 9070   polyethylene glycol (MIRALAX  / GLYCOLAX ) packet 17 g  17 g Oral Daily PRN Foust, Katy L, NP       potassium chloride  SA (KLOR-CON  M) CR tablet 20 mEq  20 mEq Oral Once Foust, Katy L, NP       sertraline  (ZOLOFT ) tablet 50 mg  50 mg Oral Daily Foust, Katy L, NP   50 mg at 12/07/23 9043   traZODone  (DESYREL ) tablet 100 mg  100 mg Oral QHS Foust, Katy L, NP        Allergies as of 12/06/2023 - Review Complete 12/06/2023  Allergen Reaction Noted   Morphine  and codeine Nausea And Vomiting 03/06/2016   Penicillins  11/05/2011    Family History  Problem Relation Age of Onset   High Cholesterol Mother    Pancreatic cancer Mother    Heart attack Father    Heart disease Father    Breast cancer Neg Hx    BRCA 1/2 Neg Hx     Social History   Socioeconomic History   Marital status: Divorced    Spouse name: Not on file   Number of children: Not on file   Years of education: Not on file   Highest education level: Bachelor's degree (e.g., BA, AB, BS)  Occupational History   Not on file  Tobacco Use   Smoking status: Never   Smokeless tobacco: Never  Vaping Use   Vaping status: Never Used  Substance and Sexual Activity    Alcohol use: No    Comment: 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   Drug use: Not Currently    Types: Cocaine    Comment: 2016 last used    Sexual activity: Yes    Birth control/protection: Post-menopausal  Other Topics Concern   Not on file  Social History Narrative   Not on file   Social Drivers of Health   Financial Resource Strain: Not on file  Food Insecurity: Unknown (12/06/2023)   Hunger Vital Sign    Worried About Running Out of Food in the Last Year: Never true    Ran Out of Food in the Last Year: Patient declined  Transportation Needs: No Transportation Needs (12/06/2023)   PRAPARE - Administrator, Civil Service (Medical): No    Lack of Transportation (Non-Medical): No  Physical Activity: Not on file  Stress: Not on file  Social Connections: Moderately Integrated (12/06/2023)   Social Connection and Isolation Panel    Frequency of Communication with Friends and Family: More than three times a week    Frequency of Social Gatherings with Friends and Family: More than three times a week    Attends Religious Services: More than 4 times per year    Active Member of [employer] or Organizations: Yes    Attends Banker Meetings: More than 4 times per year    Marital Status: Divorced  Intimate Partner Violence: Not At Risk (12/06/2023)   Humiliation, Afraid, Rape, and Kick questionnaire    Fear of Current or Ex-Partner: No    Emotionally Abused: No    Physically Abused: No    Sexually Abused: No   Review of Systems: Gen: Denies fever, sweats or chills. No weight loss.  CV: Denies chest pain, palpitations or edema. Resp: Denies cough, shortness of breath of hemoptysis.  GI: See HPI. GU : Denies urinary burning, blood in urine,  increased urinary frequency or incontinence. MS: Denies joint pain, muscles aches or weakness. Derm: Denies rash, itchiness, skin lesions or unhealing ulcers. Psych: Denies depression, anxiety, memory loss or  confusion. Heme: Denies easy bruising, bleeding. Neuro:  Denies headaches, dizziness or paresthesias. Endo:  Denies any problems with DM, thyroid or adrenal function.  Physical Exam: Vital signs in last 24 hours: Temp:  [98.4 F (36.9 C)-100 F (37.8 C)] 98.9 F (37.2 C) (09/23 1203) Pulse Rate:  [82-119] 119 (09/23 1203) Resp:  [13-25] 17 (09/23 1203) BP: (107-147)/(59-87) 109/70 (09/23 1203) SpO2:  [91 %-96 %] 92 % (09/23 1203) Weight:  [65.8 kg] 65.8 kg (09/22 1343) Last BM Date : 12/06/23 General: 63 year old female ambulating up in the room in no acute distress. Head:  Normocephalic and atraumatic. Eyes:  No scleral icterus. Conjunctiva pink. Ears:  Normal auditory acuity. Nose:  No deformity, discharge or lesions. Mouth:  Dentition intact. No ulcers or lesions.  Neck:  Supple. No lymphadenopathy or thyromegaly.  Lungs: Breath sounds clear throughout. No wheezes, rhonchi or crackles.  Heart: Regular rate and rhythm, no murmurs. Abdomen: Soft, nondistended.  Nontender.  Positive bowel sounds all 4 quadrants.  Midline abdominal scar intact. Rectal: Deferred. Musculoskeletal:  Symmetrical without gross deformities.  Pulses:  Normal pulses noted. Extremities:  Without clubbing or edema. Neurologic:  Alert and  oriented x 4. No focal deficits.  Skin:  Intact without significant lesions or rashes. Psych:  Alert and cooperative. Normal mood and affect.  Intake/Output from previous day: 09/22 0701 - 09/23 0700 In: 600 [P.O.:600] Out: -  Intake/Output this shift: No intake/output data recorded.  Lab Results: Recent Labs    12/06/23 1349 12/07/23 0210 12/07/23 0931  WBC 7.9 12.7*  --   HGB 7.1* 6.8* 6.5*  HCT 24.8* 22.9* 22.2*  PLT 366 352  --    BMET Recent Labs    12/06/23 1349 12/07/23 0201  NA 138 137  K 3.3* 3.4*  CL 104 104  CO2 21* 21*  GLUCOSE 98 129*  BUN 10 12  CREATININE 0.64 0.78  CALCIUM  9.0 8.4*   LFT Recent Labs    12/06/23 1630  12/07/23 0201  PROT 6.9 5.9*  ALBUMIN  4.4 3.3*  AST 26 19  ALT 14 13  ALKPHOS 102 75  BILITOT 0.2 0.4  BILIDIR <0.1  --   IBILI NOT CALCULATED  --    PT/INR No results for input(s): LABPROT, INR in the last 72 hours. Hepatitis Panel No results for input(s): HEPBSAG, HCVAB, HEPAIGM, HEPBIGM in the last 72 hours.    Studies/Results: CT CHEST ABDOMEN PELVIS W CONTRAST Result Date: 12/06/2023 CLINICAL DATA:  Anemia, GI bleed. Shortness of breath. Heart racing. EXAM: CT CHEST, ABDOMEN, AND PELVIS WITH CONTRAST TECHNIQUE: Multidetector CT imaging of the chest, abdomen and pelvis was performed following the standard protocol during bolus administration of intravenous contrast. RADIATION DOSE REDUCTION: This exam was performed according to the departmental dose-optimization program which includes automated exposure control, adjustment of the mA and/or kV according to patient size and/or use of iterative reconstruction technique. CONTRAST:  OMNIPAQUE  IOHEXOL  300 MG/ML  SOLN COMPARISON:  None Available. FINDINGS: CT CHEST FINDINGS Cardiovascular: Heart is normal size. Aorta is normal caliber. Filling defects noted within multiple right pulmonary arteries in all lobes of the right lung compatible with pulmonary emboli. Suspect small subsegmental pulmonary embolus in the left upper lobe. No evidence of right heart strain. Mediastinum/Nodes: Moderate-sized hiatal hernia. No mediastinal, hilar, or axillary adenopathy. Trachea and  esophagus are unremarkable. Thyroid unremarkable. Lungs/Pleura: Linear areas of scarring in the lingula and right middle lobe as well as lung bases/lower lobes. No acute confluent opacities or effusions. Musculoskeletal: Chest wall soft tissues are unremarkable. No acute bony abnormality. CT ABDOMEN PELVIS FINDINGS Hepatobiliary: No focal hepatic abnormality. Gallbladder unremarkable. Pancreas: No focal abnormality or ductal dilatation. Spleen: No focal abnormality.   Normal size. Adrenals/Urinary Tract: No adrenal abnormality. No focal renal abnormality. No stones or hydronephrosis. Urinary bladder is unremarkable. Stomach/Bowel: Stomach, large and small bowel grossly unremarkable. Vascular/Lymphatic: No evidence of aneurysm or adenopathy. Reproductive: Prior hysterectomy.  No adnexal masses. Other: No free fluid or free air. Musculoskeletal: No acute bony abnormality. IMPRESSION: Incidental pulmonary emboli noted in all lobes of the right lung and left upper lobe. No evidence of right heart strain. Moderate-sized hiatal hernia. No acute findings in the abdomen or pelvis. These results were called by telephone at the time of interpretation on 12/06/2023 at 5:10 pm to provider [facility] , who verbally acknowledged these results. Electronically Signed   By: Franky Crease M.D.   On: 12/06/2023 17:11   DG Chest 2 View Result Date: 12/06/2023 CLINICAL DATA:  Shortness of breath EXAM: CHEST - 2 VIEW COMPARISON:  June 28, 2023 cardiac CT FINDINGS: Linear left basilar opacity likely representing atelectasis. No pleural effusions or pneumothorax. Likely hiatal hernia.  No acute osseous findings. IMPRESSION: Hiatal hernia.  No acute airspace disease. Electronically Signed   By: Michaeline Blanch M.D.   On: 12/06/2023 14:26    IMPRESSION/PLAN:  63 year old female admitted 12/06/2023 with symptomatic IDA with generalized weakness and DOE.  Hg 7.1 -> 6.8, 1 unit PRBCs transfusing.  FOBT negative.  Iron  < 10. Ferritin 17. Chest CT showed pulmonary emboli in all lobes of the right lung and left upper lobe with evidence of heart strain and a  moderate hiatal. On Heparin  IV. On oral B12 and iron . Patient reported her most recent colonoscopy in 2018 was normal. - Check H&H posttransfusion - Transfuse for hemoglobin level less than 7 or as needed if symptomatic  - Continue Pantoprazole  40 mg p.o. daily - Defer endoscopic recommendations to Dr. Albertus  Hiatal hernia per CT,  asymptomatic. 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  CTAP 12/06/2023 was unremarkable.   Elida HERO Kennedy-Smith  12/07/2023, 1:55 PM

## 2023-12-07 NOTE — Plan of Care (Signed)
   Problem: Clinical Measurements: Goal: Diagnostic test results will improve Outcome: Progressing Goal: Respiratory complications will improve Outcome: Progressing

## 2023-12-07 NOTE — Progress Notes (Signed)
   12/07/23 0827  Spiritual Encounters  Type of Visit Initial  Care provided to: Patient  Referral source Clinical staff  Reason for visit Routine spiritual support  OnCall Visit No  Spiritual Framework  Presenting Themes Impactful experiences and emotions;Meaning/purpose/sources of inspiration  Patient Stress Factors Exhausted;Health changes;Major life changes  Family Stress Factors None identified  Interventions  Spiritual Care Interventions Made Compassionate presence;Established relationship of care and support;Prayer  Intervention Outcomes  Outcomes Reduced anxiety;Awareness of support;Awareness of health   Chaplain visited Pt who expressed fear and distress after learning that a blood chots was found in her throat. Pt stated this is new information for her and shared she is scared. Pt requested prayer.  Chaplain offered words of comfort and provided prayer at her request, offering emotional and spiritual support.  Pt also requested that the chaplain return for a follow up visit. Chaplain agreed to return later in the afternoon around noon.

## 2023-12-08 DIAGNOSIS — I2699 Other pulmonary embolism without acute cor pulmonale: Secondary | ICD-10-CM | POA: Diagnosis not present

## 2023-12-08 DIAGNOSIS — I824Y2 Acute embolism and thrombosis of unspecified deep veins of left proximal lower extremity: Secondary | ICD-10-CM | POA: Diagnosis not present

## 2023-12-08 DIAGNOSIS — D509 Iron deficiency anemia, unspecified: Secondary | ICD-10-CM | POA: Diagnosis not present

## 2023-12-08 LAB — BPAM RBC
Blood Product Expiration Date: 202510252359
ISSUE DATE / TIME: 202509230945
Unit Type and Rh: 5100

## 2023-12-08 LAB — BASIC METABOLIC PANEL WITH GFR
Anion gap: 9 (ref 5–15)
BUN: 8 mg/dL (ref 8–23)
CO2: 24 mmol/L (ref 22–32)
Calcium: 8.8 mg/dL — ABNORMAL LOW (ref 8.9–10.3)
Chloride: 105 mmol/L (ref 98–111)
Creatinine, Ser: 0.69 mg/dL (ref 0.44–1.00)
GFR, Estimated: >60 mL/min (ref >60)
Glucose, Bld: 118 mg/dL — ABNORMAL HIGH (ref 70–99)
Potassium: 4 mmol/L (ref 3.5–5.1)
Sodium: 138 mmol/L (ref 135–145)

## 2023-12-08 LAB — CBC
HCT: 27.2 % — ABNORMAL LOW (ref 36.0–46.0)
Hemoglobin: 8.4 g/dL — ABNORMAL LOW (ref 12.0–15.0)
MCH: 24.8 pg — ABNORMAL LOW (ref 26.0–34.0)
MCHC: 30.9 g/dL (ref 30.0–36.0)
MCV: 80.2 fL (ref 80.0–100.0)
Platelets: 331 K/uL (ref 150–400)
RBC: 3.39 MIL/uL — ABNORMAL LOW (ref 3.87–5.11)
RDW: 17.6 % — ABNORMAL HIGH (ref 11.5–15.5)
WBC: 11.8 K/uL — ABNORMAL HIGH (ref 4.0–10.5)
nRBC: 0 % (ref 0.0–0.2)

## 2023-12-08 LAB — TYPE AND SCREEN
ABO/RH(D): O POS
Antibody Screen: NEGATIVE
Unit division: 0

## 2023-12-08 LAB — HEPARIN LEVEL (UNFRACTIONATED): Heparin Unfractionated: 0.56 [IU]/mL (ref 0.30–0.70)

## 2023-12-08 MED ORDER — SUMATRIPTAN SUCCINATE 50 MG PO TABS
50.0000 mg | ORAL_TABLET | Freq: Three times a day (TID) | ORAL | Status: DC | PRN
  Administered 2023-12-08: 50 mg via ORAL
  Filled 2023-12-08 (×2): qty 1

## 2023-12-08 MED ORDER — IRON SUCROSE 300 MG IVPB - SIMPLE MED
300.0000 mg | Freq: Once | Status: AC
  Administered 2023-12-08: 300 mg via INTRAVENOUS
  Filled 2023-12-08: qty 300

## 2023-12-08 NOTE — Plan of Care (Signed)
   Problem: Clinical Measurements: Goal: Will remain free from infection Outcome: Progressing Goal: Diagnostic test results will improve Outcome: Progressing Goal: Respiratory complications will improve Outcome: Progressing Goal: Cardiovascular complication will be avoided Outcome: Progressing

## 2023-12-08 NOTE — Progress Notes (Signed)
 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  Heme-negative stool.  Principal Problem:   Pulmonary embolism (HCC) Active Problems:   Rectal bleeding   Iron  deficiency anemia   Hypothyroidism   Hyperlipidemia   Bipolar I disorder (HCC)   Symptomatic anemia   Recommendations   1.  Iron  deficiency anemia/heme neg stool/hx of appendiceal cancer in remission --unprovoked PE and DVT.  Needs GI evaluation but in the setting of acute bilateral PE, heme-negative stool without overt bleeding we will defer upper endoscopy and colonoscopy to the outpatient setting. -- Outpatient follow-up with [facility] -- Outpatient upper endoscopy and colonoscopy recommended; we can discuss scheduling this with hematology input as to when it is safe to hold anticoagulation for these procedures -- Outpatient hematology consult recommended -- Replace iron  IV while here and as an outpatient -- I will arrange follow-up  2.  Bilateral PE, acute left lower extremity DVT --on heparin  drip with plans to transition to Eliquis  for discharge --Hematology outpatient consultation recommended --DOAC at discharge  3.  Hypothyroidism --on home dose of Synthroid   4.  History of appendiceal cancer and peritoneal involvement --status post debulking and HIPEC in 2018.  Follows with Dr. Claryce and disease felt to be in remission.  35 minutes total spent today including patient facing time, coordination of care, reviewing medical history/procedures/pertinent radiology studies, and documentation of the encounter.  Chief Complaint   Patient concerned about her cough No abdominal pain, nausea or vomiting Bowel movements without visible blood or melena She is on heparin  infusion  Vital signs in last 24  hours: Temp:  [98.9 F (37.2 C)-99.6 F (37.6 C)] 99 F (37.2 C) (09/24 1426) Pulse Rate:  [89-108] 99 (09/24 1426) Resp:  [13-30] 16 (09/24 1426) BP: (114-133)/(72-81) 114/76 (09/24 1426) SpO2:  [86 %-95 %] 93 % (09/24 1426) Weight:  [72 kg] 72 kg (09/24 0500) Last BM Date : 12/07/23 (per pt) Gen: awake, alert, NAD HEENT: anicteric  CV: Tachycardic but regular Pulm: CTA b/l Abd: soft, NT/ND, +BS throughout Neuro: nonfocal  Intake/Output from previous day: 09/23 0701 - 09/24 0700 In: 1231 [P.O.:540; I.V.:269; Blood:422] Out: -  Intake/Output this shift: No intake/output data recorded.  Lab Results: Recent Labs    12/07/23 0210 12/07/23 0931 12/07/23 1708 12/08/23 0337  WBC 12.7*  --  12.4* 11.8*  HGB 6.8* 6.5* 8.9* 8.4*  HCT 22.9* 22.2* 28.6* 27.2*  PLT 352  --  342 331   BMET Recent Labs    12/06/23 1349 12/07/23 0201 12/08/23 0337  NA 138 137 138  K 3.3* 3.4* 4.0  CL 104 104 105  CO2 21* 21* 24  GLUCOSE 98 129* 118*  BUN 10 12 8   CREATININE 0.64 0.78 0.69  CALCIUM  9.0 8.4* 8.8*   LFT Recent Labs    12/06/23 1630 12/07/23 0201  PROT 6.9 5.9*  ALBUMIN  4.4 3.3*  AST 26 19  ALT 14 13  ALKPHOS 102 75  BILITOT 0.2 0.4  BILIDIR <0.1  --   IBILI NOT CALCULATED  --    PT/INR Recent Labs    12/07/23 1708  LABPROT 15.4*  INR 1.2     Studies/Results: VAS US  LOWER EXTREMITY VENOUS (DVT) Result Date: 12/07/2023  Lower Venous DVT Study Patient Name:  Kim [name]  Date  of Exam:   12/07/2023 Medical Rec #: [id number]        Accession #:    [id number] Date of Birth: 1960/04/13        Patient Gender: F Patient Age:   57 years Exam Location:  [facility] Procedure:      VAS US  LOWER EXTREMITY VENOUS (DVT) Referring Phys: ROCKIE FOUST --------------------------------------------------------------------------------  Indications: Pulmonary embolism.  Risk Factors: Hx of cancer (appendix/ovarian), decreased activity. Comparison Study: No previous exams  Performing Technologist: Jody Hill RVT, RDMS  Examination Guidelines: A complete evaluation includes B-mode imaging, spectral Doppler, color Doppler, and power Doppler as needed of all accessible portions of each vessel. Bilateral testing is considered an integral part of a complete examination. Limited examinations for reoccurring indications may be performed as noted. The reflux portion of the exam is performed with the patient in reverse Trendelenburg.  +---------+---------------+---------+-----------+----------+--------------+ RIGHT    CompressibilityPhasicitySpontaneityPropertiesThrombus Aging +---------+---------------+---------+-----------+----------+--------------+ CFV      Full           Yes      Yes                                 +---------+---------------+---------+-----------+----------+--------------+ SFJ      Full                                                        +---------+---------------+---------+-----------+----------+--------------+ FV Prox  Full           Yes      Yes                                 +---------+---------------+---------+-----------+----------+--------------+ FV Mid   Full           Yes      Yes                                 +---------+---------------+---------+-----------+----------+--------------+ FV DistalFull           Yes      Yes                                 +---------+---------------+---------+-----------+----------+--------------+ PFV      Full                                                        +---------+---------------+---------+-----------+----------+--------------+ POP      Full           Yes      Yes                                 +---------+---------------+---------+-----------+----------+--------------+ PTV      Full                                                        +---------+---------------+---------+-----------+----------+--------------+  PERO     Full                                                         +---------+---------------+---------+-----------+----------+--------------+   +---------+---------------+---------+-----------+----------+-----------------+ LEFT     CompressibilityPhasicitySpontaneityPropertiesThrombus Aging    +---------+---------------+---------+-----------+----------+-----------------+ CFV      Full           Yes      Yes                                    +---------+---------------+---------+-----------+----------+-----------------+ SFJ      Full                                                           +---------+---------------+---------+-----------+----------+-----------------+ FV Prox  Full           Yes      Yes                                    +---------+---------------+---------+-----------+----------+-----------------+ FV Mid   None           No       No                   Acute             +---------+---------------+---------+-----------+----------+-----------------+ FV DistalPartial        No       No                   Acute             +---------+---------------+---------+-----------+----------+-----------------+ PFV      Full                                                           +---------+---------------+---------+-----------+----------+-----------------+ POP      None           No       No                   Age Indeterminate +---------+---------------+---------+-----------+----------+-----------------+ PTV      None           No       No                   Acute             +---------+---------------+---------+-----------+----------+-----------------+ PERO     Full                                                           +---------+---------------+---------+-----------+----------+-----------------+  Summary: BILATERAL: -No evidence of popliteal cyst, bilaterally. RIGHT: - There is no evidence of deep vein thrombosis in the lower extremity.  LEFT: - Findings consistent with  acute deep vein thrombosis involving the left femoral vein, and left posterior tibial veins.  - Findings consistent with age indeterminate deep vein thrombosis involving the left popliteal vein.   *See table(s) above for measurements and observations. Electronically signed by Debby Robertson on 12/07/2023 at 5:18:27 PM.    Final    ECHOCARDIOGRAM COMPLETE Result Date: 12/07/2023    ECHOCARDIOGRAM REPORT   Patient Name:   Kim [name] Date of Exam: 12/07/2023 Medical Rec #:  [id number]       Height:       61.0 in Accession #:    [id number]      Weight:       145.0 lb Date of Birth:  03/20/1960       BSA:          1.647 m Patient Age:    63 years        BP:           109/70 mmHg Patient Gender: F               HR:           105 bpm. Exam Location:  Inpatient Procedure: 2D Echo, Cardiac Doppler and Color Doppler (Both Spectral and Color            Flow Doppler were utilized during procedure). Indications:    Pulmonary embolis  History:        Patient has no prior history of Echocardiogram examinations.                 Arrythmias:Tachycardia, Signs/Symptoms:Dyspnea and Fatigue; Risk                 Factors:Non-Smoker.  Sonographer:    Juliene Rucks Referring Phys: [id number] EVA KATHEE PORE  Sonographer Comments: Suboptimal apical window and suboptimal subcostal window. Image acquisition challenging due to uncooperative patient. IMPRESSIONS  1. Left ventricular ejection fraction, by estimation, is 70 to 75%. The left ventricle has hyperdynamic function. Left ventricular endocardial border not optimally defined to evaluate regional wall motion. Left ventricular diastolic parameters are consistent with Grade I diastolic dysfunction (impaired relaxation). Unable to assess A2C view due to patient discomfort.  2. Right ventricular systolic function is hyperdynamic. The right ventricular size is normal.  3. The mitral valve is grossly normal. No evidence of mitral valve regurgitation. No evidence of mitral stenosis.  4. The  aortic valve was not well visualized. There is moderate calcification of the aortic valve. Aortic valve regurgitation is not visualized. Aortic valve sclerosis is present, with no evidence of aortic valve stenosis. Comparison(s): No prior Echocardiogram. FINDINGS  Left Ventricle: Left ventricular ejection fraction, by estimation, is 70 to 75%. The left ventricle has hyperdynamic function. Left ventricular endocardial border not optimally defined to evaluate regional wall motion. The left ventricular internal cavity size was normal in size. There is no left ventricular hypertrophy. Left ventricular diastolic parameters are consistent with Grade I diastolic dysfunction (impaired relaxation). Right Ventricle: The right ventricular size is normal. No increase in right ventricular wall thickness. Right ventricular systolic function is hyperdynamic. Left Atrium: Left atrial size was normal in size. Right Atrium: Right atrial size was normal in size. Pericardium: Trivial pericardial effusion is present. Mitral Valve: The mitral valve is grossly normal. No evidence of mitral valve regurgitation. No evidence of  mitral valve stenosis. Tricuspid Valve: The tricuspid valve is normal in structure. Tricuspid valve regurgitation is mild . No evidence of tricuspid stenosis. Aortic Valve: The aortic valve was not well visualized. There is moderate calcification of the aortic valve. Aortic valve regurgitation is not visualized. Aortic valve sclerosis is present, with no evidence of aortic valve stenosis. Pulmonic Valve: The pulmonic valve was not well visualized. Pulmonic valve regurgitation is not visualized. No evidence of pulmonic stenosis. Aorta: The aortic root and ascending aorta are structurally normal, with no evidence of dilitation. IAS/Shunts: The atrial septum is grossly normal.  LEFT VENTRICLE PLAX 2D LVIDd:         3.66 cm LVIDs:         2.10 cm LV PW:         0.97 cm LV IVS:        0.99 cm LVOT diam:     1.99 cm LVOT  Area:     3.11 cm  LV Volumes (MOD) LV vol d, MOD A4C: 88.6 ml LV vol s, MOD A4C: 29.7 ml LV SV MOD A4C:     88.6 ml RIGHT VENTRICLE RV Basal diam:  2.90 cm RV Mid diam:    2.51 cm LEFT ATRIUM           Index        RIGHT ATRIUM          Index LA diam:      2.57 cm 1.56 cm/m   RA Area:     8.37 cm LA Vol (A4C): 36.7 ml 22.28 ml/m  RA Volume:   13.70 ml 8.32 ml/m   AORTA Ao Root diam: 2.70 cm Ao Asc diam:  2.65 cm  SHUNTS Systemic Diam: 1.99 cm Stanly Leavens MD Electronically signed by Stanly Leavens MD Signature Date/Time: 12/07/2023/1:37:09 PM    Final    CT CHEST ABDOMEN PELVIS W CONTRAST Result Date: 12/06/2023 CLINICAL DATA:  Anemia, GI bleed. Shortness of breath. Heart racing. EXAM: CT CHEST, ABDOMEN, AND PELVIS WITH CONTRAST TECHNIQUE: Multidetector CT imaging of the chest, abdomen and pelvis was performed following the standard protocol during bolus administration of intravenous contrast. RADIATION DOSE REDUCTION: This exam was performed according to the departmental dose-optimization program which includes automated exposure control, adjustment of the mA and/or kV according to patient size and/or use of iterative reconstruction technique. CONTRAST:  OMNIPAQUE  IOHEXOL  300 MG/ML  SOLN COMPARISON:  None Available. FINDINGS: CT CHEST FINDINGS Cardiovascular: Heart is normal size. Aorta is normal caliber. Filling defects noted within multiple right pulmonary arteries in all lobes of the right lung compatible with pulmonary emboli. Suspect small subsegmental pulmonary embolus in the left upper lobe. No evidence of right heart strain. Mediastinum/Nodes: Moderate-sized hiatal hernia. No mediastinal, hilar, or axillary adenopathy. Trachea and esophagus are unremarkable. Thyroid unremarkable. Lungs/Pleura: Linear areas of scarring in the lingula and right middle lobe as well as lung bases/lower lobes. No acute confluent opacities or effusions. Musculoskeletal: Chest wall soft tissues are  unremarkable. No acute bony abnormality. CT ABDOMEN PELVIS FINDINGS Hepatobiliary: No focal hepatic abnormality. Gallbladder unremarkable. Pancreas: No focal abnormality or ductal dilatation. Spleen: No focal abnormality.  Normal size. Adrenals/Urinary Tract: No adrenal abnormality. No focal renal abnormality. No stones or hydronephrosis. Urinary bladder is unremarkable. Stomach/Bowel: Stomach, large and small bowel grossly unremarkable. Vascular/Lymphatic: No evidence of aneurysm or adenopathy. Reproductive: Prior hysterectomy.  No adnexal masses. Other: No free fluid or free air. Musculoskeletal: No acute bony abnormality. IMPRESSION: Incidental pulmonary emboli noted in  all lobes of the right lung and left upper lobe. No evidence of right heart strain. Moderate-sized hiatal hernia. No acute findings in the abdomen or pelvis. These results were called by telephone at the time of interpretation on 12/06/2023 at 5:10 pm to provider [facility] , who verbally acknowledged these results. Electronically Signed   By: Franky Crease M.D.   On: 12/06/2023 17:11      LOS: 2 days   Gordy CHRISTELLA Starch, MD 12/08/2023, 4:03 PM See TRACE[facility], [facility], to contact our on call provider

## 2023-12-08 NOTE — Progress Notes (Signed)
 PHARMACY - ANTICOAGULATION CONSULT NOTE  Pharmacy Consult for Heparin  Indication: pulmonary embolus  Allergies  Allergen Reactions   Morphine  And Codeine Nausea And Vomiting   Penicillins Other (See Comments)    Unknown childhood reaction    Patient Measurements: Height: 5' 1 (154.9 cm) Weight: 72 kg (158 lb 11.7 oz) IBW/kg (Calculated) : 47.8 HEPARIN  DW (KG): 61.6  Vital Signs: Temp: 99.6 F (37.6 C) (09/24 0439) Temp Source: Oral (09/24 0439) BP: 129/81 (09/24 0000) Pulse Rate: 99 (09/24 0400)  Labs: Recent Labs    12/06/23 1349 12/07/23 0201 12/07/23 0210 12/07/23 0931 12/07/23 1708 12/08/23 0337  HGB 7.1*  --  6.8* 6.5* 8.9* 8.4*  HCT 24.8*  --  22.9* 22.2* 28.6* 27.2*  PLT 366  --  352  --  342 331  LABPROT  --   --   --   --  15.4*  --   INR  --   --   --   --  1.2  --   HEPARINUNFRC  --   --  0.64 0.53  --  0.56  CREATININE 0.64 0.78  --   --   --  0.69    Estimated Creatinine Clearance: 63.6 mL/min (by C-G formula based on SCr of 0.69 mg/dL).  Assessment: Pt is a 63yoF with PMH of appendiceal cancer and carcinomatosis. Pt presents with SOB on exertion. CT Chest from 9/22 reveals PE with no right heart strain. Pt is not on anticoagulation prior to presentation. Pt also reports streaky blood with wiping but no overt rectal bleeding.   HgB 8.4 Heparin  level therapeutic   Goal of Therapy:  Heparin  level 0.3-0.7 units/ml Monitor platelets by anticoagulation protocol: Yes   Plan:  Continue heparin  drip at 1000 units/hr Daily heparin  level, CBC Monitor for s/sx of bleeding  Thank you. Olam Monte, PharmD 12/08/2023 7:25 AM

## 2023-12-08 NOTE — Progress Notes (Signed)
 PROGRESS NOTE                                                                                                                                                                                                             Patient Demographics:    Kim [name], is a 63 y.o. female, DOB - Mar 16, 1961, FMW:[id number]  Outpatient Primary MD for the patient is Placey, Ronal Caldron, NP    LOS - 2  Admit date - 12/06/2023    Chief Complaint  Patient presents with   Shortness of Breath   Palpitations       Brief Narrative (HPI from H&P)    63 y.o. year old female with past medical history of hypothyroidism, prior appendix cancer, ovarian cancer, depression, and anxiety.  She presents to [facility] with a several week history of progressively worsening dyspnea on exertion, generalized fatigue, exertional palpitations, and chest pressure with exertion/ambulation.  She endorses immobility and not frequently standing to walk around, nonproductive cough and rhinorrhea that began yesterday as well as subjective/low-grade fevers. She denies lower extremity swelling, recent long distance travel, prior history of PE/DVT, estrogen use, syncope, confusion, or diaphoresis.    Subjective:    Kim [name] today has, No headache, No chest pain, No abdominal pain - No Nausea, No new weakness tingling or numbness, improved SOB   Assessment  & Plan :   # Bilateral pulmonary emboli without (R) Heart Strain, acute left lower extremity DVT - echo stable on heparin  drip continue to monitor improving.  Her initial shortness of breath most likely due to symptomatic anemia as below.   #Rectal bleeding  #Iron  Deficiency anemia  - Hemoccult negative, could have had occult blood loss in the past, seen by GI on PPI, on oral iron  supplementation.  H&H stable no signs of ongoing bleeding on heparin  drip.  Likely GI to perform EGD colonoscopy this admission.  Was  transfused in the ER, posttransfusion H&H stable.  #Hypothyroidism -Continue home Synthroid    #Hyperlipidemia -Continue home Lipitor   #Bipolar -Continue home Zoloft , BuSpar , and trazodone         Condition - Fair  Family Communication  :  None present  Code Status :  Full  Consults  :  GI  PUD Prophylaxis : PPI   Procedures  :     Echocardiogram.  1.  Left ventricular ejection fraction, by estimation, is 70 to 75%. The left ventricle has hyperdynamic function. Left ventricular endocardial border not optimally defined to evaluate regional wall motion. Left ventricular diastolic parameters are consistent with Grade I diastolic dysfunction (impaired relaxation). Unable to assess A2C view due to patient discomfort.  2. Right ventricular systolic function is hyperdynamic. The right ventricular size is normal.  3. The mitral valve is grossly normal. No evidence of mitral valve regurgitation. No evidence of mitral stenosis.  4. The aortic valve was not well visualized. There is moderate calcification of the aortic valve. Aortic valve regurgitation is not visualized. Aortic valve sclerosis is present, with no evidence of aortic valve stenosis  CT angiogram.  Incidental pulmonary emboli noted in all lobes of the right lung and left upper lobe. No evidence of right heart strain. Moderate-sized hiatal hernia. No acute findings in the abdomen or pelvis.  US  - RIGHT: - There is no evidence of deep vein thrombosis in the lower extremity.  LEFT: - Findings consistent with acute deep vein thrombosis involving the left femoral vein, and left posterior tibial veins.  - Findings consistent with age indeterminate deep vein thrombosis involving the left popliteal vein      Disposition Plan  :    Status is: Inpatient   DVT Prophylaxis  :  SCDs   Lab Results  Component Value Date   PLT 331 12/08/2023    Diet :  Diet Order             Diet regular Room service appropriate? Yes; Fluid  consistency: Thin  Diet effective now                    Inpatient Medications  Scheduled Meds:  atorvastatin   40 mg Oral QHS   busPIRone   15 mg Oral BID   vitamin B-12  1,000 mcg Oral Daily   ferrous sulfate   325 mg Oral Q breakfast   levothyroxine   25 mcg Oral Q0600   pantoprazole   40 mg Oral Daily   sertraline   50 mg Oral Daily   traZODone   100 mg Oral QHS   Continuous Infusions:  heparin  1,000 Units/hr (12/07/23 1814)   PRN Meds:.acetaminophen  **OR** acetaminophen , benzonatate , guaiFENesin , menthol , ondansetron  (ZOFRAN ) IV, polyethylene glycol  Antibiotics  :    Anti-infectives (From admission, onward)    None         Objective:   Vitals:   12/08/23 0400 12/08/23 0439 12/08/23 0500 12/08/23 0830  BP:    123/73  Pulse: 99   89  Resp: 16   13  Temp:  99.6 F (37.6 C)  99.1 F (37.3 C)  TempSrc:  Oral  Oral  SpO2: 95%   95%  Weight:   72 kg   Height:        Wt Readings from Last 3 Encounters:  12/08/23 72 kg  08/17/19 68.4 kg  05/25/16 63.6 kg     Intake/Output Summary (Last 24 hours) at 12/08/2023 1021 Last data filed at 12/08/2023 0400 Gross per 24 hour  Intake 1231.03 ml  Output --  Net 1231.03 ml     Physical Exam  Awake Alert, No new F.N deficits, Normal affect Kim [name].AT,PERRAL Supple Neck, No JVD,   Symmetrical Chest wall movement, Good air movement bilaterally, CTAB RRR,No Gallops,Rubs or new Murmurs,  +ve B.Sounds, Abd Soft, No tenderness,   No Cyanosis, Clubbing or edema     RN pressure injury documentation:      Data  Review:    Recent Labs  Lab 12/06/23 1349 12/07/23 0210 12/07/23 0931 12/07/23 1708 12/08/23 0337  WBC 7.9 12.7*  --  12.4* 11.8*  HGB 7.1* 6.8* 6.5* 8.9* 8.4*  HCT 24.8* 22.9* 22.2* 28.6* 27.2*  PLT 366 352  --  342 331  MCV 79.5* 78.2*  --  81.0 80.2  MCH 22.8* 23.2*  --  25.2* 24.8*  MCHC 28.6* 29.7*  --  31.1 30.9  RDW 16.1* 16.0*  --  17.7* 17.6*  LYMPHSABS  --  3.3  --   --   --   MONOABS  --   0.7  --   --   --   EOSABS  --  0.1  --   --   --   BASOSABS  --  0.1  --   --   --     Recent Labs  Lab 12/06/23 1349 12/06/23 1630 12/07/23 0201 12/07/23 1708 12/08/23 0337  NA 138  --  137  --  138  K 3.3*  --  3.4*  --  4.0  CL 104  --  104  --  105  CO2 21*  --  21*  --  24  ANIONGAP 13  --  12  --  9  GLUCOSE 98  --  129*  --  118*  BUN 10  --  12  --  8  CREATININE 0.64  --  0.78  --  0.69  AST  --  26 19  --   --   ALT  --  14 13  --   --   ALKPHOS  --  102 75  --   --   BILITOT  --  0.2 0.4  --   --   ALBUMIN   --  4.4 3.3*  --   --   DDIMER  --  4.76*  --   --   --   LATICACIDVEN  --   --   --  1.1  --   INR  --   --   --  1.2  --   BNP  --   --   --  75.5  --   MG  --   --  1.9  --   --   CALCIUM  9.0  --  8.4*  --  8.8*      Recent Labs  Lab 12/06/23 1349 12/06/23 1630 12/07/23 0201 12/07/23 1708 12/08/23 0337  DDIMER  --  4.76*  --   --   --   LATICACIDVEN  --   --   --  1.1  --   INR  --   --   --  1.2  --   BNP  --   --   --  75.5  --   MG  --   --  1.9  --   --   CALCIUM  9.0  --  8.4*  --  8.8*    --------------------------------------------------------------------------------------------------------------- No results found for: CHOL, HDL, LDLCALC, LDLDIRECT, TRIG, CHOLHDL  No results found for: HGBA1C No results for input(s): TSH, T4TOTAL, FREET4, T3FREE, THYROIDAB in the last 72 hours. Recent Labs    12/06/23 1630 12/07/23 0931  VITAMINB12 273 247  FOLATE 19.4 13.6  FERRITIN 17 10*  TIBC 480* 421  IRON  <10* <10*  RETICCTPCT 2.7  --    ------------------------------------------------------------------------------------------------------------------ Cardiac Enzymes No results for input(s): CKMB, TROPONINI, MYOGLOBIN in the last 168 hours.  Invalid input(s): CK  Micro  Results Recent Results (from the past 240 hours)  Urine Culture     Status: None   Collection Time: 12/06/23  4:40 PM   Specimen:  Urine, Clean Catch  Result Value Ref Range Status   Specimen Description   Final    URINE, CLEAN CATCH Performed at [facility], [address] [place], [place] [address]    Special Requests   Final    NONE Reflexed from [id number] Performed at [facility], [address], [place], [place] [address]    Culture   Final    NO GROWTH Performed at [facility], [address] [place], [place] [address]    Report Status 12/07/2023 FINAL  Final  Resp panel by RT-PCR (RSV, Flu A&B, Covid) Anterior Nasal Swab     Status: None   Collection Time: 12/07/23  9:37 AM   Specimen: Anterior Nasal Swab  Result Value Ref Range Status   SARS Coronavirus 2 by RT PCR NEGATIVE NEGATIVE Final   Influenza A by PCR NEGATIVE NEGATIVE Final   Influenza B by PCR NEGATIVE NEGATIVE Final    Comment: (NOTE) The Xpert Xpress SARS-CoV-2/FLU/RSV plus assay is intended as an aid in the diagnosis of influenza from Nasopharyngeal swab specimens and should not be used as a sole basis for treatment. Nasal washings and aspirates are unacceptable for Xpert Xpress SARS-CoV-2/FLU/RSV testing.  Fact Sheet for Patients: BloggerCourse.com  Fact Sheet for Healthcare Providers: SeriousBroker.it  This test is not yet approved or cleared by the [place]  FDA and has been authorized for detection and/or diagnosis of SARS-CoV-2 by FDA under an Emergency Use Authorization (EUA). This EUA will remain in effect (meaning this test can be used) for the duration of the COVID-19 declaration under Section 564(b)(1) of the Act, 21 U.S.C. section 360bbb-3(b)(1), unless the authorization is terminated or revoked.     Resp Syncytial Virus by PCR NEGATIVE NEGATIVE Final    Comment: (NOTE) Fact Sheet for Patients: BloggerCourse.com  Fact Sheet for Healthcare Providers: SeriousBroker.it  This test  is not yet approved or cleared by the [place]  FDA and has been authorized for detection and/or diagnosis of SARS-CoV-2 by FDA under an Emergency Use Authorization (EUA). This EUA will remain in effect (meaning this test can be used) for the duration of the COVID-19 declaration under Section 564(b)(1) of the Act, 21 U.S.C. section 360bbb-3(b)(1), unless the authorization is terminated or revoked.  Performed at [facility], [address], [place], [place] [address]     Radiology Report VAS US  LOWER EXTREMITY VENOUS (DVT) Result Date: 12/07/2023  Lower Venous DVT Study Patient Name:  GIZEL RIEDLINGER  Date of Exam:   12/07/2023 Medical Rec #: [id number]        Accession #:    [id number] Date of Birth: [birth year]        Patient Gender: F Patient Age:   34 years Exam Location:  [facility] Procedure:      VAS US  LOWER EXTREMITY VENOUS (DVT) Referring Phys: ROCKIE FOUST --------------------------------------------------------------------------------  Indications: Pulmonary embolism.  Risk Factors: Hx of cancer (appendix/ovarian), decreased activity. Comparison Study: No previous exams Performing Technologist: Jody Hill RVT, RDMS  Examination Guidelines: A complete evaluation includes B-mode imaging, spectral Doppler, color Doppler, and power Doppler as needed of all accessible portions of each vessel. Bilateral testing is considered an integral part of a complete examination. Limited examinations for reoccurring indications may be performed as noted. The reflux portion of the  exam is performed with the patient in reverse Trendelenburg.  +---------+---------------+---------+-----------+----------+--------------+ RIGHT    CompressibilityPhasicitySpontaneityPropertiesThrombus Aging +---------+---------------+---------+-----------+----------+--------------+ CFV      Full           Yes      Yes                                  +---------+---------------+---------+-----------+----------+--------------+ SFJ      Full                                                        +---------+---------------+---------+-----------+----------+--------------+ FV Prox  Full           Yes      Yes                                 +---------+---------------+---------+-----------+----------+--------------+ FV Mid   Full           Yes      Yes                                 +---------+---------------+---------+-----------+----------+--------------+ FV DistalFull           Yes      Yes                                 +---------+---------------+---------+-----------+----------+--------------+ PFV      Full                                                        +---------+---------------+---------+-----------+----------+--------------+ POP      Full           Yes      Yes                                 +---------+---------------+---------+-----------+----------+--------------+ PTV      Full                                                        +---------+---------------+---------+-----------+----------+--------------+ PERO     Full                                                        +---------+---------------+---------+-----------+----------+--------------+   +---------+---------------+---------+-----------+----------+-----------------+ LEFT     CompressibilityPhasicitySpontaneityPropertiesThrombus Aging    +---------+---------------+---------+-----------+----------+-----------------+ CFV      Full           Yes      Yes                                    +---------+---------------+---------+-----------+----------+-----------------+  SFJ      Full                                                           +---------+---------------+---------+-----------+----------+-----------------+ FV Prox  Full           Yes      Yes                                     +---------+---------------+---------+-----------+----------+-----------------+ FV Mid   None           No       No                   Acute             +---------+---------------+---------+-----------+----------+-----------------+ FV DistalPartial        No       No                   Acute             +---------+---------------+---------+-----------+----------+-----------------+ PFV      Full                                                           +---------+---------------+---------+-----------+----------+-----------------+ POP      None           No       No                   Age Indeterminate +---------+---------------+---------+-----------+----------+-----------------+ PTV      None           No       No                   Acute             +---------+---------------+---------+-----------+----------+-----------------+ PERO     Full                                                           +---------+---------------+---------+-----------+----------+-----------------+     Summary: BILATERAL: -No evidence of popliteal cyst, bilaterally. RIGHT: - There is no evidence of deep vein thrombosis in the lower extremity.  LEFT: - Findings consistent with acute deep vein thrombosis involving the left femoral vein, and left posterior tibial veins.  - Findings consistent with age indeterminate deep vein thrombosis involving the left popliteal vein.   *See table(s) above for measurements and observations. Electronically signed by Debby Robertson on 12/07/2023 at 5:18:27 PM.    Final    ECHOCARDIOGRAM COMPLETE Result Date: 12/07/2023    ECHOCARDIOGRAM REPORT   Patient Name:   KENADIE ROYCE Date of Exam: 12/07/2023 Medical Rec #:  [id number]       Height:       61.0 in Accession #:    [id number]  Weight:       145.0 lb Date of Birth:  05-Apr-1960       BSA:          1.647 m Patient Age:    63 years        BP:           109/70 mmHg Patient Gender: F               HR:           105 bpm.  Exam Location:  Inpatient Procedure: 2D Echo, Cardiac Doppler and Color Doppler (Both Spectral and Color            Flow Doppler were utilized during procedure). Indications:    Pulmonary embolis  History:        Patient has no prior history of Echocardiogram examinations.                 Arrythmias:Tachycardia, Signs/Symptoms:Dyspnea and Fatigue; Risk                 Factors:Non-Smoker.  Sonographer:    Juliene Rucks Referring Phys: [id number] EVA KATHEE PORE  Sonographer Comments: Suboptimal apical window and suboptimal subcostal window. Image acquisition challenging due to uncooperative patient. IMPRESSIONS  1. Left ventricular ejection fraction, by estimation, is 70 to 75%. The left ventricle has hyperdynamic function. Left ventricular endocardial border not optimally defined to evaluate regional wall motion. Left ventricular diastolic parameters are consistent with Grade I diastolic dysfunction (impaired relaxation). Unable to assess A2C view due to patient discomfort.  2. Right ventricular systolic function is hyperdynamic. The right ventricular size is normal.  3. The mitral valve is grossly normal. No evidence of mitral valve regurgitation. No evidence of mitral stenosis.  4. The aortic valve was not well visualized. There is moderate calcification of the aortic valve. Aortic valve regurgitation is not visualized. Aortic valve sclerosis is present, with no evidence of aortic valve stenosis. Comparison(s): No prior Echocardiogram. FINDINGS  Left Ventricle: Left ventricular ejection fraction, by estimation, is 70 to 75%. The left ventricle has hyperdynamic function. Left ventricular endocardial border not optimally defined to evaluate regional wall motion. The left ventricular internal cavity size was normal in size. There is no left ventricular hypertrophy. Left ventricular diastolic parameters are consistent with Grade I diastolic dysfunction (impaired relaxation). Right Ventricle: The right ventricular size  is normal. No increase in right ventricular wall thickness. Right ventricular systolic function is hyperdynamic. Left Atrium: Left atrial size was normal in size. Right Atrium: Right atrial size was normal in size. Pericardium: Trivial pericardial effusion is present. Mitral Valve: The mitral valve is grossly normal. No evidence of mitral valve regurgitation. No evidence of mitral valve stenosis. Tricuspid Valve: The tricuspid valve is normal in structure. Tricuspid valve regurgitation is mild . No evidence of tricuspid stenosis. Aortic Valve: The aortic valve was not well visualized. There is moderate calcification of the aortic valve. Aortic valve regurgitation is not visualized. Aortic valve sclerosis is present, with no evidence of aortic valve stenosis. Pulmonic Valve: The pulmonic valve was not well visualized. Pulmonic valve regurgitation is not visualized. No evidence of pulmonic stenosis. Aorta: The aortic root and ascending aorta are structurally normal, with no evidence of dilitation. IAS/Shunts: The atrial septum is grossly normal.  LEFT VENTRICLE PLAX 2D LVIDd:         3.66 cm LVIDs:         2.10 cm LV PW:  0.97 cm LV IVS:        0.99 cm LVOT diam:     1.99 cm LVOT Area:     3.11 cm  LV Volumes (MOD) LV vol d, MOD A4C: 88.6 ml LV vol s, MOD A4C: 29.7 ml LV SV MOD A4C:     88.6 ml RIGHT VENTRICLE RV Basal diam:  2.90 cm RV Mid diam:    2.51 cm LEFT ATRIUM           Index        RIGHT ATRIUM          Index LA diam:      2.57 cm 1.56 cm/m   RA Area:     8.37 cm LA Vol (A4C): 36.7 ml 22.28 ml/m  RA Volume:   13.70 ml 8.32 ml/m   AORTA Ao Root diam: 2.70 cm Ao Asc diam:  2.65 cm  SHUNTS Systemic Diam: 1.99 cm Stanly Leavens MD Electronically signed by Stanly Leavens MD Signature Date/Time: 12/07/2023/1:37:09 PM    Final    CT CHEST ABDOMEN PELVIS W CONTRAST Result Date: 12/06/2023 CLINICAL DATA:  Anemia, GI bleed. Shortness of breath. Heart racing. EXAM: CT CHEST, ABDOMEN, AND  PELVIS WITH CONTRAST TECHNIQUE: Multidetector CT imaging of the chest, abdomen and pelvis was performed following the standard protocol during bolus administration of intravenous contrast. RADIATION DOSE REDUCTION: This exam was performed according to the departmental dose-optimization program which includes automated exposure control, adjustment of the mA and/or kV according to patient size and/or use of iterative reconstruction technique. CONTRAST:  OMNIPAQUE  IOHEXOL  300 MG/ML  SOLN COMPARISON:  None Available. FINDINGS: CT CHEST FINDINGS Cardiovascular: Heart is normal size. Aorta is normal caliber. Filling defects noted within multiple right pulmonary arteries in all lobes of the right lung compatible with pulmonary emboli. Suspect small subsegmental pulmonary embolus in the left upper lobe. No evidence of right heart strain. Mediastinum/Nodes: Moderate-sized hiatal hernia. No mediastinal, hilar, or axillary adenopathy. Trachea and esophagus are unremarkable. Thyroid unremarkable. Lungs/Pleura: Linear areas of scarring in the lingula and right middle lobe as well as lung bases/lower lobes. No acute confluent opacities or effusions. Musculoskeletal: Chest wall soft tissues are unremarkable. No acute bony abnormality. CT ABDOMEN PELVIS FINDINGS Hepatobiliary: No focal hepatic abnormality. Gallbladder unremarkable. Pancreas: No focal abnormality or ductal dilatation. Spleen: No focal abnormality.  Normal size. Adrenals/Urinary Tract: No adrenal abnormality. No focal renal abnormality. No stones or hydronephrosis. Urinary bladder is unremarkable. Stomach/Bowel: Stomach, large and small bowel grossly unremarkable. Vascular/Lymphatic: No evidence of aneurysm or adenopathy. Reproductive: Prior hysterectomy.  No adnexal masses. Other: No free fluid or free air. Musculoskeletal: No acute bony abnormality. IMPRESSION: Incidental pulmonary emboli noted in all lobes of the right lung and left upper lobe. No evidence  of right heart strain. Moderate-sized hiatal hernia. No acute findings in the abdomen or pelvis. These results were called by telephone at the time of interpretation on 12/06/2023 at 5:10 pm to provider [facility] , who verbally acknowledged these results. Electronically Signed   By: Franky Crease M.D.   On: 12/06/2023 17:11   DG Chest 2 View Result Date: 12/06/2023 CLINICAL DATA:  Shortness of breath EXAM: CHEST - 2 VIEW COMPARISON:  June 28, 2023 cardiac CT FINDINGS: Linear left basilar opacity likely representing atelectasis. No pleural effusions or pneumothorax. Likely hiatal hernia.  No acute osseous findings. IMPRESSION: Hiatal hernia.  No acute airspace disease. Electronically Signed   By: Michaeline Blanch M.D.   On: 12/06/2023 14:26  Signature  -   Lavada Stank M.D on 12/08/2023 at 10:21 AM   -  To page go to [email or web address]

## 2023-12-08 NOTE — Progress Notes (Signed)
   NAME:  GENOVEVA SINGLETON, MRN:  [id number], DOB:  1960/11/11, LOS: 2 ADMISSION DATE:  12/06/2023, CONSULTATION DATE: 12/07/2023 REFERRING MD:  Dr Dolan, CHIEF COMPLAINT: Shortness of breath  History of Present Illness:  Noelia Lenart is a 63 y.o. F with PMH significant for prior low grade appendiceal Ca s/p laparotomy, partial cystectomy, omenectomy and chemotherapy now in remission, GERD, bipolar disorder who presented with shortness of breath and fatigue for about two weeks and was found to have multiple pulmonary emboli in all lobes of the R lung and LUL without evidence of R heart strain.  Troponin was negative, BNP and lactic acid are pending.  Pt has had HR in the low 100's, no O2 requirement and stable BP.  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 rate 13, height 5' 1 (1.549 m), weight 72 kg, SpO2 95%.        Intake/Output Summary (Last 24 hours) at 12/08/2023 1331 Last data filed at 12/08/2023 0400 Gross per 24 hour  Intake 1231.03 ml  Output --  Net 1231.03 ml   Filed Weights   12/06/23 1343 12/08/23 0500  Weight: 65.8 kg 72 kg    Examination: General: Middle-age, does not appear to be in distress HENT: Moist oral  mucosa Lungs: Decreased air entry bilaterally Cardiovascular: S1-S2 appreciated Abdomen: Soft, bowel sounds appreciated Extremities: No clubbing, no edema Neuro: Awake alert oriented x 3 nonfocal GU:   I reviewed last 24 h vitals and pain scores, last 48 h intake and output, last 24 h labs and trends, and last 24 h imaging results.  Resolved problem list   Assessment and Plan   Bilateral pulmonary embolism -Currently on anticoagulation and tolerating it well Anemia - Requiring transfusion  History of bipolar disorder Hyperlipidemia Hypothyroidism  Has remained hemodynamically stable  Recommend at least 3 months of anticoagulation though may require longer/lifetime if there is concern for thrombophilia  Pulmonary will sign off at present Call as needed

## 2023-12-08 NOTE — Evaluation (Signed)
 Occupational Therapy Evaluation and Discharge Patient Details Name: Kim [name] MRN: [id number] DOB: 13-Jun-1960 Today's Date: 12/08/2023   History of Present Illness   Kim [name] is a 63 y.o. year old female who presents to [facility] with a several week history of progressively worsening dyspnea on exertion, generalized fatigue, exertional palpitations, and chest pressure with exertion/ambulation.  Pt found to have PEs in all lobes of the right lung and left upper lobe as well as DVT in left femoral vein, and left posterior tibial veins; as well as age indeterminate DVT in the left popliteal vein. Past medical history of hypothyroidism, prior appendix cancer, ovarian cancer, depression, and anxiety.     Clinical Impressions This 63 yo female admitted with above presents to acute OT at an independent level for basic ADLs and mobility. Pt was educated on energy conservation strategies and given handout to look over as well. No further OT needs, we will sign off.      Functional Status Assessment   Patient has not had a recent decline in their functional status     Equipment Recommendations   None recommended by OT      Precautions/Restrictions   Precautions Precautions: None Restrictions Weight Bearing Restrictions Per Provider Order: No     Mobility Bed Mobility Overal bed mobility: Independent                  Transfers Overall transfer level: Independent                        Balance Overall balance assessment: Independent                                         ADL either performed or assessed with clinical judgement   ADL Overall ADL's : Independent                                       General ADL Comments: Educated her on purse lipped breathing, tripod breathing, sitting to do tasks whenever possible, and not trying to do too much in one day--all energy conservation strategies. Also provided  pt with the 5 P's of Energy Conservation handout     Vision Patient Visual Report: No change from baseline              Pertinent Vitals/Pain Pain Assessment Pain Assessment: No/denies pain     Extremity/Trunk Assessment Upper Extremity Assessment Upper Extremity Assessment: Overall WFL for tasks assessed           Communication Communication Communication: No apparent difficulties   Cognition Arousal: Alert Behavior During Therapy: WFL for tasks assessed/performed Cognition: No apparent impairments                               Following commands: Intact                  Home Living Family/patient expects to be discharged to:: Private residence Living Arrangements: Non-relatives/Friends (tenant) Available Help at Discharge: Available 24 hours/day;Other (Comment) Type of Home: House Home Access: Stairs to enter [employer] of Steps: 2 Entrance Stairs-Rails: None Home Layout: One level     Bathroom Shower/Tub: Producer, television/film/video: Handicapped  height     Home Equipment: None          Prior Functioning/Environment Prior Level of Function : Independent/Modified Independent;Working/employed;Driving                            OT Goals(Current goals can be found in the care plan section)   Acute Rehab OT Goals Patient Stated Goal: to be able to move around more on my own         AM-PAC OT 6 Clicks Daily Activity     Outcome Measure Help from another person eating meals?: None Help from another person taking care of personal grooming?: None Help from another person toileting, which includes using toliet, bedpan, or urinal?: None Help from another person bathing (including washing, rinsing, drying)?: None Help from another person to put on and taking off regular upper body clothing?: None Help from another person to put on and taking off regular lower body clothing?: None 6 Click Score: 24   End  of Session Nurse Communication:  (would benefit from a portable telemetry box so she can be ambulatory around the unit on her own)  Activity Tolerance: Patient tolerated treatment well Patient left:  (with RN getting her a set with a portable telemetry box so she can walk around the unit on her own pushing her IV pole)                   Time: 1450-1505 OT Time Calculation (min): 15 min Charges:  OT General Charges $OT Visit: 1 Visit OT Evaluation $OT Eval Low Complexity: 1 Low  Donny BECKER OT Acute Rehabilitation Services Office [phone]    [place] Dorothyann Distel 12/08/2023, 3:55 PM

## 2023-12-09 ENCOUNTER — Other Ambulatory Visit (HOSPITAL_COMMUNITY): Payer: Self-pay

## 2023-12-09 DIAGNOSIS — I2699 Other pulmonary embolism without acute cor pulmonale: Secondary | ICD-10-CM | POA: Diagnosis not present

## 2023-12-09 LAB — CBC
HCT: 28 % — ABNORMAL LOW (ref 36.0–46.0)
Hemoglobin: 8.5 g/dL — ABNORMAL LOW (ref 12.0–15.0)
MCH: 24.6 pg — ABNORMAL LOW (ref 26.0–34.0)
MCHC: 30.4 g/dL (ref 30.0–36.0)
MCV: 80.9 fL (ref 80.0–100.0)
Platelets: 323 K/uL (ref 150–400)
RBC: 3.46 MIL/uL — ABNORMAL LOW (ref 3.87–5.11)
RDW: 17.9 % — ABNORMAL HIGH (ref 11.5–15.5)
WBC: 8 K/uL (ref 4.0–10.5)
nRBC: 0.2 % (ref 0.0–0.2)

## 2023-12-09 LAB — BASIC METABOLIC PANEL WITH GFR
Anion gap: 10 (ref 5–15)
BUN: 11 mg/dL (ref 8–23)
CO2: 24 mmol/L (ref 22–32)
Calcium: 8.7 mg/dL — ABNORMAL LOW (ref 8.9–10.3)
Chloride: 105 mmol/L (ref 98–111)
Creatinine, Ser: 0.83 mg/dL (ref 0.44–1.00)
GFR, Estimated: >60 mL/min (ref >60)
Glucose, Bld: 117 mg/dL — ABNORMAL HIGH (ref 70–99)
Potassium: 3.7 mmol/L (ref 3.5–5.1)
Sodium: 139 mmol/L (ref 135–145)

## 2023-12-09 LAB — HEPARIN LEVEL (UNFRACTIONATED): Heparin Unfractionated: 0.64 [IU]/mL (ref 0.30–0.70)

## 2023-12-09 LAB — MAGNESIUM: Magnesium: 2.1 mg/dL (ref 1.7–2.4)

## 2023-12-09 MED ORDER — PANTOPRAZOLE SODIUM 40 MG PO TBEC
40.0000 mg | DELAYED_RELEASE_TABLET | Freq: Every day | ORAL | 2 refills | Status: DC
  Filled 2023-12-09: qty 30, 30d supply, fill #0

## 2023-12-09 MED ORDER — CYANOCOBALAMIN 1000 MCG PO TABS
1000.0000 ug | ORAL_TABLET | Freq: Every day | ORAL | 1 refills | Status: AC
  Filled 2023-12-09: qty 30, 30d supply, fill #0

## 2023-12-09 MED ORDER — FOLIC ACID 1 MG PO TABS
1.0000 mg | ORAL_TABLET | Freq: Every day | ORAL | 0 refills | Status: DC
  Filled 2023-12-09: qty 30, 30d supply, fill #0

## 2023-12-09 MED ORDER — APIXABAN (ELIQUIS) VTE STARTER PACK (10MG AND 5MG)
ORAL_TABLET | ORAL | 0 refills | Status: DC
  Filled 2023-12-09: qty 74, 30d supply, fill #0

## 2023-12-09 MED ORDER — FERROUS SULFATE 325 (65 FE) MG PO TABS
325.0000 mg | ORAL_TABLET | Freq: Every day | ORAL | 1 refills | Status: AC
  Filled 2023-12-09: qty 30, 30d supply, fill #0

## 2023-12-09 MED ORDER — APIXABAN 5 MG PO TABS
10.0000 mg | ORAL_TABLET | Freq: Two times a day (BID) | ORAL | Status: DC
Start: 2023-12-09 — End: 2023-12-09
  Administered 2023-12-09: 10 mg via ORAL
  Filled 2023-12-09: qty 2

## 2023-12-09 MED ORDER — APIXABAN 5 MG PO TABS: 5.0000 mg | ORAL_TABLET | Freq: Two times a day (BID) | ORAL | Status: DC

## 2023-12-09 NOTE — Progress Notes (Signed)
 SATURATION QUALIFICATIONS: (This note is used to comply with regulatory documentation for home oxygen)  Patient Saturations on Room Air at Rest = 94%  Patient Saturations on Room Air while Ambulating = 70-91%  Patient Saturations on 2 Liters of oxygen while Ambulating = 91%  Please briefly explain why patient needs home oxygen:  At rest pt oxygen saturations 94%. While ambulating pt oxygen saturations began to decrease with the lowest reading at 70%. Pt recovered after one minute of sitting on side of the bed with oxygen saturations reading 92%.   RN ambulated patient starting with room air again. Pt sats dropped into the 70s. Pt needed 2L  to keep oxygen saturations 88% and above.

## 2023-12-09 NOTE — Discharge Instructions (Addendum)
 Follow with Primary MD Placey, Ronal Caldron, NP in 2-3 days   Get CBC, CMP, Magnesium , 2 view Chest X ray -  checked next visit with your primary MD    Activity: As tolerated with Full fall precautions use walker/cane & assistance as needed  Disposition Home    Diet: Heart Healthy    Special Instructions: If you have smoked or chewed Tobacco  in the last 2 yrs please stop smoking, stop any regular Alcohol  and or any Recreational drug use.  On your next visit with your primary care physician please Get Medicines reviewed and adjusted.  Please request your Prim.MD to go over all Hospital Tests and Procedure/Radiological results at the follow up, please get all Hospital records sent to your Prim MD by signing hospital release before you go home.  If you experience worsening of your admission symptoms, develop shortness of breath, life threatening emergency, suicidal or homicidal thoughts you must seek medical attention immediately by calling 911 or calling your MD immediately  if symptoms less severe.  You Must read complete instructions/literature along with all the possible adverse reactions/side effects for all the Medicines you take and that have been prescribed to you. Take any new Medicines after you have completely understood and accpet all the possible adverse reactions/side effects.   Do not drive when taking Pain medications.  Do not take more than prescribed Pain, Sleep and Anxiety Medications  Wear Seat belts while driving.    Information on my medicine - ELIQUIS  (apixaban )   Why was Eliquis  prescribed for you? Eliquis  was prescribed to treat blood clots that may have been found in the veins of your legs (deep vein thrombosis) or in your lungs (pulmonary embolism) and to reduce the risk of them occurring again.  What do You need to know about Eliquis  ? The starting dose is 10 mg (two 5 mg tablets) taken TWICE daily for the FIRST SEVEN (7) DAYS, then on October 2  the dose is  reduced to ONE 5 mg tablet taken TWICE daily.  Eliquis  may be taken with or without food.   Try to take the dose about the same time in the morning and in the evening. If you have difficulty swallowing the tablet whole please discuss with your pharmacist how to take the medication safely.  Take Eliquis  exactly as prescribed and DO NOT stop taking Eliquis  without talking to the doctor who prescribed the medication.  Stopping may increase your risk of developing a new blood clot.  Refill your prescription before you run out.  After discharge, you should have regular check-up appointments with your healthcare provider that is prescribing your Eliquis .    What do you do if you miss a dose? If a dose of ELIQUIS  is not taken at the scheduled time, take it as soon as possible on the same day and twice-daily administration should be resumed. The dose should not be doubled to make up for a missed dose.  Important Safety Information A possible side effect of Eliquis  is bleeding. You should call your healthcare provider right away if you experience any of the following: Bleeding from an injury or your nose that does not stop. Unusual colored urine (red or dark brown) or unusual colored stools (red or black). Unusual bruising for unknown reasons. A serious fall or if you hit your head (even if there is no bleeding).  Some medicines may interact with Eliquis  and might increase your risk of bleeding or clotting while on Eliquis .  To help avoid this, consult your healthcare provider or pharmacist prior to using any new prescription or non-prescription medications, including herbals, vitamins, non-steroidal anti-inflammatory drugs (NSAIDs) and supplements.  This website has more information on Eliquis  (apixaban ): http://[email or web address] .com/eliquis dena

## 2023-12-09 NOTE — Progress Notes (Signed)
 Discharge Nurse Summary: DC order noted per MD. DC RN at bedside with patient. Patient agreeable with discharge plan, states family will arrive soon for pickup. AVS printed/reviewed. PIV removed, skin intact. Home O2 delivered to bedside. No home meds. Pending TOC med pickup. CP/Edu resolved. Telemonitor returned to charging station. All belongings accounted for. Patient to inform primary RN when family arrives.   Rosario EMERSON Lund, RN

## 2023-12-09 NOTE — TOC Transition Note (Signed)
 Transition of Care [facility]) - Discharge Note   Patient Details  Name: Kim [name] MRN: [id number] Date of Birth: [birth year]  [facility] CM/SW Contact:  Rosalva Jon Bloch, RN Phone Number: 12/09/2023, 9:56 AM   Clinical Narrative:    Patient will DC to: home Anticipated DC date: 12/09/2023 Family notified: yes Transport by:car  Per MD patient ready for DC today . RN, patient, patient's boyfriend notified of DC.  Order noted for DME: OXYGEN. Pt agreeable. Pt without provider preference. Referral made with [facility]/ [employer] [phone]) for DME: OXYGEN. Equipment will be delivered to bedside prior to d/c. Post hospital f/u noted on AVS. Pt without RX med concerns. RX meds to be pick from [facility] pharmacy prior to d/c.  Boyfriend to provide transportation to home.  RNCM will sign off for now as intervention is no longer needed. Please consult us  again if new needs arise.    Final next level of care: Home/Self Care Barriers to Discharge: No Barriers Identified   Patient Goals and CMS Choice Patient states their goals for this hospitalization and ongoing recovery are:: to go home   Choice offered to / list presented to : Patient      Discharge Placement                       Discharge Plan and Services Additional resources added to the After Visit Summary for                  DME Arranged: Oxygen DME Agency: [employer] Date DME Agency Contacted: 12/09/23 Time DME Agency Contacted: [phone] Representative spoke with at DME Agency: [place]            Social Drivers of Health (SDOH) Interventions SDOH Screenings   Food Insecurity: Unknown (12/06/2023)  Housing: Low Risk  (12/06/2023)  Transportation Needs: No Transportation Needs (12/06/2023)  Utilities: Not At Risk (12/06/2023)  Social Connections: Moderately Integrated (12/06/2023)  Tobacco Use: Low Risk  (12/06/2023)     Readmission Risk Interventions     No data to display

## 2023-12-09 NOTE — Care Management Important Message (Signed)
 Important Message  Patient Details  Name: Kim [name] MRN: [id number] Date of Birth: Oct 21, 1960   Important Message Given:  Yes - Medicare IM Patient left prior to IM delivery will mail a copy of this is to the patient home address.     Tenisha Fleece 12/09/2023, 12:28 PM

## 2023-12-09 NOTE — Discharge Summary (Signed)
 Kim [name] FMW:[id number] DOB: 07-23-1960 DOA: 12/06/2023  PCP: Scarlett Ronal Caldron, NP  Admit date: 12/06/2023  Discharge date: 12/09/2023  Admitted From: Home   Disposition:  Home   Recommendations for Outpatient Follow-up:   Follow up with PCP in 1-2 weeks  PCP Please obtain BMP/CBC, 2 view CXR in 1week,  (see Discharge instructions)   PCP Please follow up on the following pending results: Monitor CBC and Anemia panel closely   Home Health: None Equipment/Devices: None Consultations: GI Discharge Condition: Stable    CODE STATUS: Full    Diet Recommendation: Heart Healthy     Chief Complaint  Patient presents with   Shortness of Breath   Palpitations     Brief history of present illness from the day of admission and additional interim summary    63 y.o. year old female with past medical history of hypothyroidism, prior appendix cancer, ovarian cancer, depression, and anxiety. She presents to [facility] with a several week history of progressively worsening dyspnea on exertion, generalized fatigue, exertional palpitations, and chest pressure with exertion/ambulation. She endorses immobility and not frequently standing to walk around, nonproductive cough and rhinorrhea that began yesterday as well as subjective/low-grade fevers. She denies lower extremity swelling, recent long distance travel, prior history of PE/DVT, estrogen use, syncope, confusion, or diaphoresis.                                                                  Hospital Course   # Bilateral pulmonary emboli without (R) Heart Strain, acute left lower extremity DVT - echo stable, was kept on heparin  drip for 48 hours without any further drop in H&H, shortness of breath has resolved, feels much better, will still get an ambulatory pulse  ox eval if qualifies for oxygen upon ambulation she will get it, for now she will be transition to oral Eliquis  with outpatient follow-up with PCP.  She is currently symptom-free.[name]    #Rectal bleeding  #Iron  Deficiency anemia, low normal B12 levels- Hemoccult negative, could have had occult blood loss in the past, seen by GI on PPI, on oral iron  supplementation.  H&H stable no signs of ongoing bleeding on heparin  drip.  Seen by GI they want to do outpatient EGD colonoscopy in 4 to 6 weeks, was provided with IV iron  here and placed on oral iron  and folic acid  supplementation along with B12.  Will follow-up with PCP for anemia panel, B12 monitoring and CBC monitoring along with GI.   #Hypothyroidism -Continue home Synthroid    #Hyperlipidemia -Continue home Lipitor   #Bipolar -Continue home Zoloft , BuSpar , and trazodone    Discharge diagnosis     Principal Problem:   Pulmonary embolism (HCC) Active Problems:   Rectal bleeding   Iron  deficiency anemia   Hypothyroidism   Hyperlipidemia  Bipolar I disorder (HCC)   Symptomatic anemia   Acute deep vein thrombosis (DVT) of proximal vein of left lower extremity (HCC)    Discharge instructions    Discharge Instructions     Diet - low sodium heart healthy   Complete by: As directed    Discharge instructions   Complete by: As directed    Follow with Primary MD Placey, Ronal Caldron, NP in 2-3 days   Get CBC, CMP, Magnesium , 2 view Chest X ray -  checked next visit with your primary MD    Activity: As tolerated with Full fall precautions use walker/cane & assistance as needed  Disposition Home    Diet: Heart Healthy    Special Instructions: If you have smoked or chewed Tobacco  in the last 2 yrs please stop smoking, stop any regular Alcohol  and or any Recreational drug use.  On your next visit with your primary care physician please Get Medicines reviewed and adjusted.  Please request your Prim.MD to go over all Hospital Tests and  Procedure/Radiological results at the follow up, please get all Hospital records sent to your Prim MD by signing hospital release before you go home.  If you experience worsening of your admission symptoms, develop shortness of breath, life threatening emergency, suicidal or homicidal thoughts you must seek medical attention immediately by calling 911 or calling your MD immediately  if symptoms less severe.  You Must read complete instructions/literature along with all the possible adverse reactions/side effects for all the Medicines you take and that have been prescribed to you. Take any new Medicines after you have completely understood and accpet all the possible adverse reactions/side effects.   Do not drive when taking Pain medications.  Do not take more than prescribed Pain, Sleep and Anxiety Medications  Wear Seat belts while driving.   Increase activity slowly   Complete by: As directed        Discharge Medications   Allergies as of 12/09/2023       Reactions   Morphine  And Codeine Nausea And Vomiting   Penicillins Other (See Comments)   Unknown childhood reaction        Medication List     TAKE these medications    apixaban  5 MG Tabs tablet Commonly known as: ELIQUIS  Take 2 tablets (10 mg total) by mouth 2 (two) times daily for 7 days, THEN 1 tablet (5 mg total) 2 (two) times daily. Start taking on: December 09, 2023   atorvastatin  80 MG tablet Commonly known as: LIPITOR Take 40 mg by mouth at bedtime.   busPIRone  15 MG tablet Commonly known as: BUSPAR  Take 15 mg by mouth 2 (two) times daily.   cyanocobalamin  1000 MCG tablet Take 1 tablet (1,000 mcg total) by mouth daily.   ferrous sulfate  325 (65 FE) MG tablet Take 1 tablet (325 mg total) by mouth daily with breakfast.   folic acid  1 MG tablet Commonly known as: FOLVITE  Take 1 tablet (1 mg total) by mouth daily.   levothyroxine  25 MCG tablet Commonly known as: SYNTHROID  Take 25 mcg by mouth daily  before breakfast.   pantoprazole  40 MG tablet Commonly known as: PROTONIX  Take 1 tablet (40 mg total) by mouth daily.   sertraline  50 MG tablet Commonly known as: ZOLOFT  Take 50 mg by mouth daily.   traZODone  100 MG tablet Commonly known as: DESYREL  Take 200 mg by mouth at bedtime.         Follow-up Information  Placey, Ronal Caldron, NP. Schedule an appointment as soon as possible for a visit in 2 day(s).   Contact information: [address]  [place] [address] [phone]         Albertus Gordy HERO, MD. Schedule an appointment as soon as possible for a visit in 1 week(s).   Specialty: Gastroenterology Contact information: [address] [place] [address] [phone]                 Major procedures and Radiology Reports - PLEASE review detailed and final reports thoroughly  -       VAS US  LOWER EXTREMITY VENOUS (DVT) Result Date: 12/07/2023  Lower Venous DVT Study Patient Name:  Kim [name]  Date of Exam:   12/07/2023 Medical Rec #: [id number]        Accession #:    [id number] Date of Birth: 1960-11-12        Patient Gender: F Patient Age:   1 years Exam Location:  [facility] Procedure:      VAS US  LOWER EXTREMITY VENOUS (DVT) Referring Phys: ROCKIE FOUST --------------------------------------------------------------------------------  Indications: Pulmonary embolism.  Risk Factors: Hx of cancer (appendix/ovarian), decreased activity. Comparison Study: No previous exams Performing Technologist: Jody Hill RVT, RDMS  Examination Guidelines: A complete evaluation includes B-mode imaging, spectral Doppler, color Doppler, and power Doppler as needed of all accessible portions of each vessel. Bilateral testing is considered an integral part of a complete examination. Limited examinations for reoccurring indications may be performed as noted. The reflux portion of the exam is performed with the patient in reverse Trendelenburg.   +---------+---------------+---------+-----------+----------+--------------+ RIGHT    CompressibilityPhasicitySpontaneityPropertiesThrombus Aging +---------+---------------+---------+-----------+----------+--------------+ CFV      Full           Yes      Yes                                 +---------+---------------+---------+-----------+----------+--------------+ SFJ      Full                                                        +---------+---------------+---------+-----------+----------+--------------+ FV Prox  Full           Yes      Yes                                 +---------+---------------+---------+-----------+----------+--------------+ FV Mid   Full           Yes      Yes                                 +---------+---------------+---------+-----------+----------+--------------+ FV DistalFull           Yes      Yes                                 +---------+---------------+---------+-----------+----------+--------------+ PFV      Full                                                        +---------+---------------+---------+-----------+----------+--------------+  POP      Full           Yes      Yes                                 +---------+---------------+---------+-----------+----------+--------------+ PTV      Full                                                        +---------+---------------+---------+-----------+----------+--------------+ PERO     Full                                                        +---------+---------------+---------+-----------+----------+--------------+   +---------+---------------+---------+-----------+----------+-----------------+ LEFT     CompressibilityPhasicitySpontaneityPropertiesThrombus Aging    +---------+---------------+---------+-----------+----------+-----------------+ CFV      Full           Yes      Yes                                     +---------+---------------+---------+-----------+----------+-----------------+ SFJ      Full                                                           +---------+---------------+---------+-----------+----------+-----------------+ FV Prox  Full           Yes      Yes                                    +---------+---------------+---------+-----------+----------+-----------------+ FV Mid   None           No       No                   Acute             +---------+---------------+---------+-----------+----------+-----------------+ FV DistalPartial        No       No                   Acute             +---------+---------------+---------+-----------+----------+-----------------+ PFV      Full                                                           +---------+---------------+---------+-----------+----------+-----------------+ POP      None           No       No                   Age Indeterminate +---------+---------------+---------+-----------+----------+-----------------+ PTV  None           No       No                   Acute             +---------+---------------+---------+-----------+----------+-----------------+ PERO     Full                                                           +---------+---------------+---------+-----------+----------+-----------------+     Summary: BILATERAL: -No evidence of popliteal cyst, bilaterally. RIGHT: - There is no evidence of deep vein thrombosis in the lower extremity.  LEFT: - Findings consistent with acute deep vein thrombosis involving the left femoral vein, and left posterior tibial veins.  - Findings consistent with age indeterminate deep vein thrombosis involving the left popliteal vein.   *See table(s) above for measurements and observations. Electronically signed by Debby Robertson on 12/07/2023 at 5:18:27 PM.    Final    ECHOCARDIOGRAM COMPLETE Result Date: 12/07/2023    ECHOCARDIOGRAM REPORT   Patient Name:    Kim [name] Date of Exam: 12/07/2023 Medical Rec #:  [id number]       Height:       61.0 in Accession #:    [id number]      Weight:       145.0 lb Date of Birth:  1961/02/16       BSA:          1.647 m Patient Age:    63 years        BP:           109/70 mmHg Patient Gender: F               HR:           105 bpm. Exam Location:  Inpatient Procedure: 2D Echo, Cardiac Doppler and Color Doppler (Both Spectral and Color            Flow Doppler were utilized during procedure). Indications:    Pulmonary embolis  History:        Patient has no prior history of Echocardiogram examinations.                 Arrythmias:Tachycardia, Signs/Symptoms:Dyspnea and Fatigue; Risk                 Factors:Non-Smoker.  Sonographer:    Juliene Rucks Referring Phys: [id number] EVA KATHEE PORE  Sonographer Comments: Suboptimal apical window and suboptimal subcostal window. Image acquisition challenging due to uncooperative patient. IMPRESSIONS  1. Left ventricular ejection fraction, by estimation, is 70 to 75%. The left ventricle has hyperdynamic function. Left ventricular endocardial border not optimally defined to evaluate regional wall motion. Left ventricular diastolic parameters are consistent with Grade I diastolic dysfunction (impaired relaxation). Unable to assess A2C view due to patient discomfort.  2. Right ventricular systolic function is hyperdynamic. The right ventricular size is normal.  3. The mitral valve is grossly normal. No evidence of mitral valve regurgitation. No evidence of mitral stenosis.  4. The aortic valve was not well visualized. There is moderate calcification of the aortic valve. Aortic valve regurgitation is not visualized. Aortic valve sclerosis is present, with no evidence of aortic valve stenosis. Comparison(s): No prior Echocardiogram. FINDINGS  Left Ventricle:  Left ventricular ejection fraction, by estimation, is 70 to 75%. The left ventricle has hyperdynamic function. Left ventricular endocardial border  not optimally defined to evaluate regional wall motion. The left ventricular internal cavity size was normal in size. There is no left ventricular hypertrophy. Left ventricular diastolic parameters are consistent with Grade I diastolic dysfunction (impaired relaxation). Right Ventricle: The right ventricular size is normal. No increase in right ventricular wall thickness. Right ventricular systolic function is hyperdynamic. Left Atrium: Left atrial size was normal in size. Right Atrium: Right atrial size was normal in size. Pericardium: Trivial pericardial effusion is present. Mitral Valve: The mitral valve is grossly normal. No evidence of mitral valve regurgitation. No evidence of mitral valve stenosis. Tricuspid Valve: The tricuspid valve is normal in structure. Tricuspid valve regurgitation is mild . No evidence of tricuspid stenosis. Aortic Valve: The aortic valve was not well visualized. There is moderate calcification of the aortic valve. Aortic valve regurgitation is not visualized. Aortic valve sclerosis is present, with no evidence of aortic valve stenosis. Pulmonic Valve: The pulmonic valve was not well visualized. Pulmonic valve regurgitation is not visualized. No evidence of pulmonic stenosis. Aorta: The aortic root and ascending aorta are structurally normal, with no evidence of dilitation. IAS/Shunts: The atrial septum is grossly normal.  LEFT VENTRICLE PLAX 2D LVIDd:         3.66 cm LVIDs:         2.10 cm LV PW:         0.97 cm LV IVS:        0.99 cm LVOT diam:     1.99 cm LVOT Area:     3.11 cm  LV Volumes (MOD) LV vol d, MOD A4C: 88.6 ml LV vol s, MOD A4C: 29.7 ml LV SV MOD A4C:     88.6 ml RIGHT VENTRICLE RV Basal diam:  2.90 cm RV Mid diam:    2.51 cm LEFT ATRIUM           Index        RIGHT ATRIUM          Index LA diam:      2.57 cm 1.56 cm/m   RA Area:     8.37 cm LA Vol (A4C): 36.7 ml 22.28 ml/m  RA Volume:   13.70 ml 8.32 ml/m   AORTA Ao Root diam: 2.70 cm Ao Asc diam:  2.65 cm  SHUNTS  Systemic Diam: 1.99 cm Stanly Leavens MD Electronically signed by Stanly Leavens MD Signature Date/Time: 12/07/2023/1:37:09 PM    Final    CT CHEST ABDOMEN PELVIS W CONTRAST Result Date: 12/06/2023 CLINICAL DATA:  Anemia, GI bleed. Shortness of breath. Heart racing. EXAM: CT CHEST, ABDOMEN, AND PELVIS WITH CONTRAST TECHNIQUE: Multidetector CT imaging of the chest, abdomen and pelvis was performed following the standard protocol during bolus administration of intravenous contrast. RADIATION DOSE REDUCTION: This exam was performed according to the departmental dose-optimization program which includes automated exposure control, adjustment of the mA and/or kV according to patient size and/or use of iterative reconstruction technique. CONTRAST:  OMNIPAQUE  IOHEXOL  300 MG/ML  SOLN COMPARISON:  None Available. FINDINGS: CT CHEST FINDINGS Cardiovascular: Heart is normal size. Aorta is normal caliber. Filling defects noted within multiple right pulmonary arteries in all lobes of the right lung compatible with pulmonary emboli. Suspect small subsegmental pulmonary embolus in the left upper lobe. No evidence of right heart strain. Mediastinum/Nodes: Moderate-sized hiatal hernia. No mediastinal, hilar, or axillary adenopathy. Trachea and esophagus are unremarkable.  Thyroid unremarkable. Lungs/Pleura: Linear areas of scarring in the lingula and right middle lobe as well as lung bases/lower lobes. No acute confluent opacities or effusions. Musculoskeletal: Chest wall soft tissues are unremarkable. No acute bony abnormality. CT ABDOMEN PELVIS FINDINGS Hepatobiliary: No focal hepatic abnormality. Gallbladder unremarkable. Pancreas: No focal abnormality or ductal dilatation. Spleen: No focal abnormality.  Normal size. Adrenals/Urinary Tract: No adrenal abnormality. No focal renal abnormality. No stones or hydronephrosis. Urinary bladder is unremarkable. Stomach/Bowel: Stomach, large and small bowel grossly  unremarkable. Vascular/Lymphatic: No evidence of aneurysm or adenopathy. Reproductive: Prior hysterectomy.  No adnexal masses. Other: No free fluid or free air. Musculoskeletal: No acute bony abnormality. IMPRESSION: Incidental pulmonary emboli noted in all lobes of the right lung and left upper lobe. No evidence of right heart strain. Moderate-sized hiatal hernia. No acute findings in the abdomen or pelvis. These results were called by telephone at the time of interpretation on 12/06/2023 at 5:10 pm to provider [facility] , who verbally acknowledged these results. Electronically Signed   By: Franky Crease M.D.   On: 12/06/2023 17:11   DG Chest 2 View Result Date: 12/06/2023 CLINICAL DATA:  Shortness of breath EXAM: CHEST - 2 VIEW COMPARISON:  June 28, 2023 cardiac CT FINDINGS: Linear left basilar opacity likely representing atelectasis. No pleural effusions or pneumothorax. Likely hiatal hernia.  No acute osseous findings. IMPRESSION: Hiatal hernia.  No acute airspace disease. Electronically Signed   By: Michaeline Blanch M.D.   On: 12/06/2023 14:26    Micro Results    Recent Results (from the past 240 hours)  Urine Culture     Status: None   Collection Time: 12/06/23  4:40 PM   Specimen: Urine, Clean Catch  Result Value Ref Range Status   Specimen Description   Final    URINE, CLEAN CATCH Performed at [facility], [address], [place], [place] [address]    Special Requests   Final    NONE Reflexed from [phone] [id number] Performed at [facility], [address], [place], [place] [address]    Culture   Final    NO GROWTH Performed at [facility], [address] [place], [place] [address]    Report Status 12/07/2023 FINAL  Final  Resp panel by RT-PCR (RSV, Flu A&B, Covid) Anterior Nasal Swab     Status: None   Collection Time: 12/07/23  9:37 AM   Specimen: Anterior Nasal Swab  Result Value Ref Range Status   SARS Coronavirus 2 by RT PCR NEGATIVE NEGATIVE Final    Influenza A by PCR NEGATIVE NEGATIVE Final   Influenza B by PCR NEGATIVE NEGATIVE Final    Comment: (NOTE) The Xpert Xpress SARS-CoV-2/FLU/RSV plus assay is intended as an aid in the diagnosis of influenza from Nasopharyngeal swab specimens and should not be used as a sole basis for treatment. Nasal washings and aspirates are unacceptable for Xpert Xpress SARS-CoV-2/FLU/RSV testing.  Fact Sheet for Patients: BloggerCourse.com  Fact Sheet for Healthcare Providers: SeriousBroker.it  This test is not yet approved or cleared by the [place]  FDA and has been authorized for detection and/or diagnosis of SARS-CoV-2 by FDA under an Emergency Use Authorization (EUA). This EUA will remain in effect (meaning this test can be used) for the duration of the COVID-19 declaration under Section 564(b)(1) of the Act, 21 U.S.C. section 360bbb-3(b)(1), unless the authorization is terminated or revoked.     Resp Syncytial Virus by PCR NEGATIVE NEGATIVE Final    Comment: (  NOTE) Fact Sheet for Patients: BloggerCourse.com  Fact Sheet for Healthcare Providers: SeriousBroker.it  This test is not yet approved or cleared by the [place]  FDA and has been authorized for detection and/or diagnosis of SARS-CoV-2 by FDA under an Emergency Use Authorization (EUA). This EUA will remain in effect (meaning this test can be used) for the duration of the COVID-19 declaration under Section 564(b)(1) of the Act, 21 U.S.C. section 360bbb-3(b)(1), unless the authorization is terminated or revoked.  Performed at [facility], [address], [place], [place] [address]     Today   Subjective    Kim [name] today has no headache,no chest abdominal pain,no new weakness tingling or numbness, feels much better wants to go home today.     Objective   Blood pressure 105/70, pulse 87, temperature  97.7 F (36.5 C), temperature source Oral, resp. rate 18, height 5' 1 (1.549 m), weight 72 kg, SpO2 94%.   Intake/Output Summary (Last 24 hours) at 12/09/2023 0757 Last data filed at 12/09/2023 0606 Gross per 24 hour  Intake 600 ml  Output --  Net 600 ml    Exam  Awake Alert, No new F.N deficits,    [place].AT,PERRAL Supple Neck,   Symmetrical Chest wall movement, Good air movement bilaterally, CTAB RRR,No Gallops,   +ve B.Sounds, Abd Soft, Non tender,  No Cyanosis, Clubbing or edema    Data Review   Recent Labs  Lab 12/06/23 1349 12/07/23 0210 12/07/23 0931 12/07/23 1708 12/08/23 0337 12/09/23 0428  WBC 7.9 12.7*  --  12.4* 11.8* 8.0  HGB 7.1* 6.8* 6.5* 8.9* 8.4* 8.5*  HCT 24.8* 22.9* 22.2* 28.6* 27.2* 28.0*  PLT 366 352  --  342 331 323  MCV 79.5* 78.2*  --  81.0 80.2 80.9  MCH 22.8* 23.2*  --  25.2* 24.8* 24.6*  MCHC 28.6* 29.7*  --  31.1 30.9 30.4  RDW 16.1* 16.0*  --  17.7* 17.6* 17.9*  LYMPHSABS  --  3.3  --   --   --   --   MONOABS  --  0.7  --   --   --   --   EOSABS  --  0.1  --   --   --   --   BASOSABS  --  0.1  --   --   --   --     Recent Labs  Lab 12/06/23 1349 12/06/23 1630 12/07/23 0201 12/07/23 1708 12/08/23 0337 12/09/23 0428  NA 138  --  137  --  138 139  K 3.3*  --  3.4*  --  4.0 3.7  CL 104  --  104  --  105 105  CO2 21*  --  21*  --  24 24  ANIONGAP 13  --  12  --  9 10  GLUCOSE 98  --  129*  --  118* 117*  BUN 10  --  12  --  8 11  CREATININE 0.64  --  0.78  --  0.69 0.83  AST  --  26 19  --   --   --   ALT  --  14 13  --   --   --   ALKPHOS  --  102 75  --   --   --   BILITOT  --  0.2 0.4  --   --   --   ALBUMIN   --  4.4 3.3*  --   --   --  DDIMER  --  4.76*  --   --   --   --   LATICACIDVEN  --   --   --  1.1  --   --   INR  --   --   --  1.2  --   --   BNP  --   --   --  75.5  --   --   MG  --   --  1.9  --   --  2.1  CALCIUM  9.0  --  8.4*  --  8.8* 8.7*    Total Time in preparing paper work, data evaluation and todays  exam - 35 minutes  Signature  -    Lavada Stank M.D on 12/09/2023 at 7:57 AM   -  To page go to [email or web address]

## 2023-12-09 NOTE — Progress Notes (Signed)
 PHARMACY - ANTICOAGULATION CONSULT NOTE  Pharmacy Consult for heparin  >> apixaban  Indication: pulmonary embolus  Allergies  Allergen Reactions   Morphine  And Codeine Nausea And Vomiting   Penicillins Other (See Comments)    Unknown childhood reaction    Patient Measurements: Height: 5' 1 (154.9 cm) Weight: 72 kg (158 lb 11.7 oz) IBW/kg (Calculated) : 47.8 HEPARIN  DW (KG): 61.6  Vital Signs: Temp: 97.7 F (36.5 C) (09/25 0355) Temp Source: Oral (09/25 0355) BP: 105/70 (09/25 0355) Pulse Rate: 87 (09/25 0355)  Labs: Recent Labs    12/07/23 0201 12/07/23 0210 12/07/23 0931 12/07/23 1708 12/08/23 0337 12/09/23 0428  HGB  --    < > 6.5* 8.9* 8.4* 8.5*  HCT  --    < > 22.2* 28.6* 27.2* 28.0*  PLT  --    < >  --  342 331 323  LABPROT  --   --   --  15.4*  --   --   INR  --   --   --  1.2  --   --   HEPARINUNFRC  --    < > 0.53  --  0.56 0.64  CREATININE 0.78  --   --   --  0.69 0.83   < > = values in this interval not displayed.    Estimated Creatinine Clearance: 61.3 mL/min (by C-G formula based on SCr of 0.83 mg/dL).  Assessment: Pt is a 63yoF with PMH of appendiceal cancer and carcinomatosis. Pt presents with SOB on exertion. CT Chest from 9/22 reveals PE with no right heart strain. Pt is not on anticoagulation prior to presentation. Pt also reports streaky blood with wiping but no overt rectal bleeding. Pharmacy consulted to transition heparin  to apixaban  for PE  HgB low, stable, plts WNL  Goal of Therapy:  Monitor platelets by anticoagulation protocol: Yes   Plan:  Stop heparin  gtt Start apixaban  10mg  PO BID for 7 days to complete loading dose, then 5mg  PO BID  F/u cost and education  Monitor for s/sx of bleeding  Lynwood Poplar, PharmD, BCPS Clinical Pharmacist 12/09/2023 5:48 AM

## 2023-12-14 ENCOUNTER — Encounter: Payer: Self-pay | Admitting: Gastroenterology

## 2024-01-04 ENCOUNTER — Ambulatory Visit: Payer: Self-pay | Admitting: Gastroenterology

## 2024-01-04 ENCOUNTER — Ambulatory Visit (INDEPENDENT_AMBULATORY_CARE_PROVIDER_SITE_OTHER): Admitting: Gastroenterology

## 2024-01-04 ENCOUNTER — Other Ambulatory Visit (INDEPENDENT_AMBULATORY_CARE_PROVIDER_SITE_OTHER)

## 2024-01-04 ENCOUNTER — Encounter: Payer: Self-pay | Admitting: Gastroenterology

## 2024-01-04 VITALS — BP 104/70 | HR 88 | Ht 60.0 in | Wt 152.1 lb

## 2024-01-04 DIAGNOSIS — I2699 Other pulmonary embolism without acute cor pulmonale: Secondary | ICD-10-CM

## 2024-01-04 DIAGNOSIS — Z7901 Long term (current) use of anticoagulants: Secondary | ICD-10-CM

## 2024-01-04 DIAGNOSIS — D509 Iron deficiency anemia, unspecified: Secondary | ICD-10-CM | POA: Diagnosis not present

## 2024-01-04 DIAGNOSIS — D649 Anemia, unspecified: Secondary | ICD-10-CM

## 2024-01-04 DIAGNOSIS — K449 Diaphragmatic hernia without obstruction or gangrene: Secondary | ICD-10-CM | POA: Diagnosis not present

## 2024-01-04 LAB — CBC WITH DIFFERENTIAL/PLATELET
Basophils Absolute: 0 K/uL (ref 0.0–0.1)
Basophils Relative: 0.9 % (ref 0.0–3.0)
Eosinophils Absolute: 0.1 K/uL (ref 0.0–0.7)
Eosinophils Relative: 2.2 % (ref 0.0–5.0)
HCT: 38.7 % (ref 36.0–46.0)
Hemoglobin: 12.4 g/dL (ref 12.0–15.0)
Lymphocytes Relative: 42.6 % (ref 12.0–46.0)
Lymphs Abs: 2.3 K/uL (ref 0.7–4.0)
MCHC: 32.1 g/dL (ref 30.0–36.0)
MCV: 84.5 fl (ref 78.0–100.0)
Monocytes Absolute: 0.5 K/uL (ref 0.1–1.0)
Monocytes Relative: 8.7 % (ref 3.0–12.0)
Neutro Abs: 2.5 K/uL (ref 1.4–7.7)
Neutrophils Relative %: 45.6 % (ref 43.0–77.0)
Platelets: 205 K/uL (ref 150.0–400.0)
RBC: 4.58 Mil/uL (ref 3.87–5.11)
RDW: 27.3 % — ABNORMAL HIGH (ref 11.5–15.5)
WBC: 5.5 K/uL (ref 4.0–10.5)

## 2024-01-04 LAB — COMPREHENSIVE METABOLIC PANEL WITH GFR
ALT: 15 U/L (ref 0–35)
AST: 17 U/L (ref 0–37)
Albumin: 4.3 g/dL (ref 3.5–5.2)
Alkaline Phosphatase: 91 U/L (ref 39–117)
BUN: 11 mg/dL (ref 6–23)
CO2: 27 meq/L (ref 19–32)
Calcium: 9 mg/dL (ref 8.4–10.5)
Chloride: 105 meq/L (ref 96–112)
Creatinine, Ser: 0.71 mg/dL (ref 0.40–1.20)
GFR: 89.14 mL/min (ref >60.00)
Glucose, Bld: 104 mg/dL — ABNORMAL HIGH (ref 70–99)
Potassium: 3.8 meq/L (ref 3.5–5.1)
Sodium: 139 meq/L (ref 135–145)
Total Bilirubin: 0.3 mg/dL (ref 0.2–1.2)
Total Protein: 6.7 g/dL (ref 6.0–8.3)

## 2024-01-04 LAB — IBC + FERRITIN
Ferritin: 43.8 ng/mL (ref 10.0–291.0)
Iron: 40 ug/dL — ABNORMAL LOW (ref 42–145)
Saturation Ratios: 11.6 % — ABNORMAL LOW (ref 20.0–50.0)
TIBC: 344.4 ug/dL (ref 250.0–450.0)
Transferrin: 246 mg/dL (ref 212.0–360.0)

## 2024-01-04 NOTE — Patient Instructions (Signed)
 Your provider has requested that you go to the basement level for lab work before leaving today. Press B on the elevator. The lab is located at the first door on the left as you exit the elevator.  Your provider has ordered [employer] stool testing for you. You have received a kit from our office today containing all necessary supplies to complete this test. Please carefully read the stool collection instructions provided in the kit before opening the accompanying materials. In addition, be sure there is a label providing your full name and date of birth on the puritan opti-swab tube that is supplied in the kit (if you do not see a label with this information on your test tube, please make us  aware before test collection!). After completing the test, you should secure the purtian tube into the specimen biohazard bag. The [facility] E-Req sheet (including date and time of specimen collection) should be placed into the outside pocket of the specimen biohazard bag and returned to the [place] lab (basement floor of [facility]) within 3 days of collection. Please make sure to give the specimen to a staff member at the lab. DO NOT leave the specimen on the counter.   If the specimen date and time (can be found in the upper right boxed portion of the sheet) are not filled out on the E-Req sheet, the test will NOT be performed.   _______________________________________________________  If your blood pressure at your visit was 140/90 or greater, please contact your primary care physician to follow up on this.  _______________________________________________________  If you are age 65 or older, your body mass index should be between 23-30. Your Body mass index is 29.71 kg/m. If this is out of the aforementioned range listed, please consider follow up with your Primary Care Provider.  If you are age 29 or younger, your body mass index should be between 19-25. Your Body mass  index is 29.71 kg/m. If this is out of the aformentioned range listed, please consider follow up with your Primary Care Provider.   ________________________________________________________  The [facility] providers would like to encourage you to use MYCHART to communicate with providers for non-urgent requests or questions.  Due to long hold times on the telephone, sending your provider a message by [facility] may be a faster and more efficient way to get a response.  Please allow 48 business hours for a response.  Please remember that this is for non-urgent requests.  _______________________________________________________  [facility] is using a team-based approach to care.  Your team is made up of your doctor and two to three APPS. Our APPS (Nurse Practitioners and Physician Assistants) work with your physician to ensure care continuity for you. They are fully qualified to address your health concerns and develop a treatment plan. They communicate directly with your gastroenterologist to care for you. Seeing the Advanced Practice Practitioners on your physician's team can help you by facilitating care more promptly, often allowing for earlier appointments, access to diagnostic testing, procedures, and other specialty referrals.

## 2024-01-04 NOTE — Progress Notes (Signed)
 Chief Complaint: Hospital follow-up Primary GI MD: Dr. Albertus  HPI: Discussed the use of AI scribe software for clinical note transcription with the patient, who gave verbal consent to proceed.  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 She is accompanied by [facility],. She was referred by her primary care physician for evaluation of anemia and gastrointestinal symptoms.  She was recently hospitalized for a blood clot and started on Eliquis . During this admission, significant anemia was noted with a hemoglobin level of 6 g/dL, which improved to 8 g/dL at discharge. She has a history of anemia, with a hemoglobin of 10.5 g/dL seven years ago, and is currently on an iron  supplement. She experiences shortness of breath but no chest pain or dizziness. An episode of palpitations occurred a couple of weeks ago, lasting about five minutes, but has not recurred.  She reports new gastrointestinal symptoms, including diarrhea that began after her recent hospital discharge. She experienced two episodes of diarrhea, one on Friday and another on Sunday, but has since returned to normal bowel habits. She describes the diarrhea as severe, stating 'it just started coming' while she was in the kitchen. No recent changes in bowel habits prior to this. She has a history of a colonoscopy in 2010 and possibly another in 2018, though she cannot recall the details.  She has a history of a hiatal hernia and reports her stomach making loud noises, which she finds bothersome. This has been occurring for about a year and is not associated with pain. She notes that the noises are more pronounced when her stomach is half full.  Her family history includes pancreatic cancer in her mother. No family history of colon  cancer.    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   Depression    GERD (gastroesophageal reflux disease)    Headache(784.0)    migraines   Herpes 1986   on patients back side since 1986    Past Surgical History:  Procedure Laterality Date   ABDOMINAL HYSTERECTOMY N/A 03/12/2016   Procedure: HYSTERECTOMY ABDOMINAL TOTAL , OMENTECTOMY, RADICAL TUMOR DEBULKING;  Surgeon: Maurilio Ship, MD;  Location: [facility];  Service: Gynecology;  Laterality: N/A;   ABDOMINAL SURGERY     for cancer   APPENDECTOMY  03/12/2016   Procedure: APPENDECTOMY;  Surgeon: Maurilio Ship, MD;  Location: [facility];  Service: Gynecology;;   FOOT SURGERY Right    correction old fracture last toe   FRACTURE SURGERY Right    patellar fracture   KNEE SURGERY     right   LAPAROTOMY N/A 03/12/2016   Procedure: EXPLORATORY LAPAROTOMY;  Surgeon: Maurilio Ship, MD;  Location: [facility];  Service: Gynecology;  Laterality: N/A;   MOUTH SURGERY     SALPINGOOPHORECTOMY Bilateral 03/12/2016   Procedure: BILATERAL SALPINGO OOPHORECTOMY;  Surgeon: Maurilio Ship, MD;  Location: [facility];  Service: Gynecology;  Laterality: Bilateral;   tummy tuck     uterine ablation      Current Outpatient Medications  Medication Sig Dispense Refill   APIXABAN  (ELIQUIS ) VTE STARTER PACK (10MG  AND 5MG ) Take  2 tablets (10 mg) by mouth 2 (two) times daily for 7 days, THEN 1 tablet (5 mg) 2 (two) times daily thereafter 74 tablet 0   atorvastatin  (LIPITOR) 80 MG tablet Take 40 mg by mouth at bedtime.     busPIRone  (BUSPAR ) 15 MG tablet Take 15 mg by mouth 2 (two) times daily.     cyanocobalamin  1000 MCG tablet Take 1 tablet (1,000 mcg total) by mouth daily. 30 tablet 1   ferrous sulfate  325 (65 FE) MG tablet Take 1 tablet (325  mg total) by mouth daily with breakfast. 30 tablet 1   folic acid  (FOLVITE ) 1 MG tablet Take 1 tablet (1 mg total) by mouth daily. 30 tablet 0   levothyroxine  (SYNTHROID ) 25 MCG tablet Take 25 mcg by mouth daily before breakfast.     pantoprazole  (PROTONIX ) 40 MG tablet Take 1 tablet (40 mg total) by mouth daily. 30 tablet 2   sertraline  (ZOLOFT ) 50 MG tablet Take 50 mg by mouth daily.     traZODone  (DESYREL ) 100 MG tablet Take 200 mg by mouth at bedtime.     No current facility-administered medications for this visit.    Allergies as of 01/04/2024 - Review Complete 01/04/2024  Allergen Reaction Noted   Morphine  Nausea And Vomiting 01/04/2024   Penicillins Other (See Comments) 11/05/2011    Family History  Problem Relation Age of Onset   High Cholesterol Mother    Pancreatic cancer Mother    Heart attack Father    Heart disease Father    Breast cancer Neg Hx    BRCA 1/2 Neg Hx     Social History   Socioeconomic History   Marital status: Divorced    Spouse name: Not on file   Number of children: Not on file   Years of education: Not on file   Highest education level: Bachelor's degree (e.g., BA, AB, BS)  Occupational History   Not on file  Tobacco Use   Smoking status: Never   Smokeless tobacco: Never  Vaping Use   Vaping status: Never Used  Substance and Sexual Activity   Alcohol use: No    Comment: 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   Drug use: Not Currently    Types: Cocaine    Comment: 2016 last used    Sexual activity: Yes    Birth control/protection: Post-menopausal  Other Topics Concern   Not on file  Social History Narrative   Not on file   Social Drivers of Health   Financial Resource Strain: Not on file  Food Insecurity: Unknown (12/06/2023)   Hunger Vital Sign    Worried About Running Out of Food in the Last Year: Never true    Ran Out of Food in the Last Year: Patient declined  Transportation Needs: No Transportation Needs  (12/06/2023)   PRAPARE - Administrator, Civil Service (Medical): No    Lack of Transportation (Non-Medical): No  Physical Activity: Not on file  Stress: Not on file  Social Connections: Moderately Integrated (12/06/2023)   Social Connection and Isolation Panel    Frequency of Communication with Friends and Family: More than three times a week    Frequency of Social Gatherings with Friends and Family: More than three times a week    Attends Religious Services: More than 4 times per year    Active Member of [employer] or Organizations: Yes    Attends Banker Meetings: More than 4 times per year  Marital Status: Divorced  Catering manager Violence: Not At Risk (12/06/2023)   Humiliation, Afraid, Rape, and Kick questionnaire    Fear of Current or Ex-Partner: No    Emotionally Abused: No    Physically Abused: No    Sexually Abused: No    Review of Systems:    Constitutional: No weight loss, fever, chills, weakness or fatigue HEENT: Eyes: No change in vision               Ears, Nose, Throat:  No change in hearing or congestion Skin: No rash or itching Cardiovascular: No chest pain, chest pressure or palpitations   Respiratory: No SOB or cough Gastrointestinal: See HPI and otherwise negative Genitourinary: No dysuria or change in urinary frequency Neurological: No headache, dizziness or syncope Musculoskeletal: No new muscle or joint pain Hematologic: No bleeding or bruising Psychiatric: No history of depression or anxiety    Physical Exam:  Vital signs: BP 104/70 (BP Location: Left Arm, Patient Position: Sitting, Cuff Size: Large)   Pulse 88   Ht 5' (1.524 m) Comment: height measured without shoes  Wt 152 lb 2 oz (69 kg)   LMP  (LMP Unknown)   BMI 29.71 kg/m   Constitutional: NAD, alert and cooperative Head:  Normocephalic and atraumatic. Eyes:   PEERL, EOMI. No icterus. Conjunctiva pale Respiratory: Respirations even and unlabored. Lungs clear to  auscultation bilaterally.   No wheezes, crackles, or rhonchi.  Cardiovascular:  Regular rate and rhythm. No peripheral edema, cyanosis or pallor.  Gastrointestinal:  Soft, nondistended, nontender. No rebound or guarding. Normal bowel sounds. No appreciable masses or hepatomegaly. Rectal:  Declines Msk:  Symmetrical without gross deformities. Without edema, no deformity or joint abnormality.  Neurologic:  Alert and  oriented x4;  grossly normal neurologically.  Skin:   Dry and intact without significant lesions or rashes. Psychiatric: Oriented to person, place and time. Demonstrates good judgement and reason without abnormal affect or behaviors.   RELEVANT LABS AND IMAGING: CBC    Component Value Date/Time   WBC 8.0 12/09/2023 0428   RBC 3.46 (L) 12/09/2023 0428   HGB 8.5 (L) 12/09/2023 0428   HCT 28.0 (L) 12/09/2023 0428   PLT 323 12/09/2023 0428   MCV 80.9 12/09/2023 0428   MCH 24.6 (L) 12/09/2023 0428   MCHC 30.4 12/09/2023 0428   RDW 17.9 (H) 12/09/2023 0428   LYMPHSABS 3.3 12/07/2023 0210   MONOABS 0.7 12/07/2023 0210   EOSABS 0.1 12/07/2023 0210   BASOSABS 0.1 12/07/2023 0210    CMP     Component Value Date/Time   NA 139 12/09/2023 0428   K 3.7 12/09/2023 0428   CL 105 12/09/2023 0428   CO2 24 12/09/2023 0428   GLUCOSE 117 (H) 12/09/2023 0428   BUN 11 12/09/2023 0428   CREATININE 0.83 12/09/2023 0428   CALCIUM  8.7 (L) 12/09/2023 0428   PROT 5.9 (L) 12/07/2023 0201   ALBUMIN  3.3 (L) 12/07/2023 0201   AST 19 12/07/2023 0201   ALT 13 12/07/2023 0201   ALKPHOS 75 12/07/2023 0201   BILITOT 0.4 12/07/2023 0201   GFRNONAA >60 12/09/2023 0428   GFRAA >60 03/13/2016 0445     Assessment/Plan:   63 year old female history of hypothyroidism, 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 No previous EGD. Hgb improved on discharge.  Last colonoscopy reported in 2018 was normal.  Needs EGD/colonoscopy the timing depends on when hematology feels patient is able to hold anticoagulation with her recent bilateral PE 1 month ago (11/2023).  No overt bleeding.  Heme-negative stool. - EGD/colonoscopy when patient is able to come off anticoagulation.  Defer to hematology for anticoagulation recommendation - Recheck CBC, CMP, iron  studies today - Continue PPI - Suspect endoscopic procedures could be done as early as 02/2024. 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  CTAP 12/06/2023 was unremarkable.    Nestor Mollie RIGGERS [facility] 01/04/2024, 12:35 PM  Cc: Scarlett Ronal Caldron, NP

## 2024-01-04 NOTE — Telephone Encounter (Signed)
-----   Message from Nestor CHRISTELLA Blower sent at 01/04/2024  1:18 PM EDT ----- Please let patient know that her labs look great.  Her hemoglobin is back to normal at 12.4.  Her iron  studies are improved.  Normal kidneys liver and electrolytes.  We will wait for her hematology  appointment and based on what they say we can proceed with EGD/colonoscopy.  For now continue iron . ----- Message ----- From: Interface, Lab In Three Zero One Sent: 01/04/2024   1:05 PM EDT To: Nestor CHRISTELLA Blower, PA-C

## 2024-01-07 ENCOUNTER — Other Ambulatory Visit (HOSPITAL_COMMUNITY): Payer: Self-pay

## 2024-01-19 ENCOUNTER — Telehealth: Payer: Self-pay | Admitting: Gastroenterology

## 2024-01-19 NOTE — Telephone Encounter (Signed)
 Called [employer] and requisition was not in with the specimen so test could not be done. Called pt and left message for her to call back. Let her know if she wants to come and pick up another kit and perform the stool test again it will be ready for her to pick up. Will wait to hear back from the pt.

## 2024-01-19 NOTE — Telephone Encounter (Signed)
 PT came into the office today to find out if her stool sample results were in. Please review and advise patient.

## 2024-01-19 NOTE — Telephone Encounter (Signed)
 No [employer] results seen in portal. Karna, have you gotten anything from [employer] requesting clarification on order etc?

## 2024-01-21 NOTE — Telephone Encounter (Signed)
 Left message for pt regarding note below that the test could not be processed due to an error. Let her know a new kit has been left up front for her if she wants to repeat the test.

## 2024-02-04 ENCOUNTER — Telehealth: Payer: Self-pay | Admitting: Gastroenterology

## 2024-02-04 NOTE — Telephone Encounter (Signed)
 Her stool study results have come back and negative c diff

## 2024-02-04 NOTE — Telephone Encounter (Signed)
 Spoke with patient. Instructed on her stool study results were negative for c diff. Patient questioning if she can go ahead and schedule her EGD/Colon. Patient is to see hematology on 02/15/24 & will need to get clearance from them to stop her Eliquis  before those procedures can be scheduled. Patient verbalized understanding & will be in touch after her visit.

## 2024-02-15 ENCOUNTER — Encounter: Payer: Self-pay | Admitting: Oncology

## 2024-02-15 ENCOUNTER — Inpatient Hospital Stay

## 2024-02-15 ENCOUNTER — Inpatient Hospital Stay: Attending: Oncology | Admitting: Oncology

## 2024-02-15 VITALS — BP 118/83 | HR 81 | Temp 97.8°F | Resp 16 | Wt 154.5 lb

## 2024-02-15 DIAGNOSIS — I824Y2 Acute embolism and thrombosis of unspecified deep veins of left proximal lower extremity: Secondary | ICD-10-CM

## 2024-02-15 DIAGNOSIS — I2699 Other pulmonary embolism without acute cor pulmonale: Secondary | ICD-10-CM

## 2024-02-15 DIAGNOSIS — Z9221 Personal history of antineoplastic chemotherapy: Secondary | ICD-10-CM | POA: Diagnosis not present

## 2024-02-15 DIAGNOSIS — C181 Malignant neoplasm of appendix: Secondary | ICD-10-CM | POA: Insufficient documentation

## 2024-02-15 DIAGNOSIS — Z7901 Long term (current) use of anticoagulants: Secondary | ICD-10-CM | POA: Insufficient documentation

## 2024-02-15 DIAGNOSIS — Z86718 Personal history of other venous thrombosis and embolism: Secondary | ICD-10-CM | POA: Insufficient documentation

## 2024-02-15 DIAGNOSIS — Z86711 Personal history of pulmonary embolism: Secondary | ICD-10-CM | POA: Insufficient documentation

## 2024-02-15 DIAGNOSIS — D508 Other iron deficiency anemias: Secondary | ICD-10-CM | POA: Diagnosis not present

## 2024-02-15 DIAGNOSIS — F1011 Alcohol abuse, in remission: Secondary | ICD-10-CM | POA: Insufficient documentation

## 2024-02-15 DIAGNOSIS — Z79899 Other long term (current) drug therapy: Secondary | ICD-10-CM | POA: Diagnosis not present

## 2024-02-15 DIAGNOSIS — D6851 Activated protein C resistance: Secondary | ICD-10-CM | POA: Insufficient documentation

## 2024-02-15 DIAGNOSIS — F3162 Bipolar disorder, current episode mixed, moderate: Secondary | ICD-10-CM | POA: Insufficient documentation

## 2024-02-15 LAB — CMP (CANCER CENTER ONLY)
ALT: 21 U/L (ref 0–44)
AST: 20 U/L (ref 15–41)
Albumin: 4.3 g/dL (ref 3.5–5.0)
Alkaline Phosphatase: 101 U/L (ref 38–126)
Anion gap: 10 (ref 5–15)
BUN: 12 mg/dL (ref 8–23)
CO2: 26 mmol/L (ref 22–32)
Calcium: 9.6 mg/dL (ref 8.9–10.3)
Chloride: 104 mmol/L (ref 98–111)
Creatinine: 0.59 mg/dL (ref 0.44–1.00)
GFR, Estimated: >60 mL/min (ref >60)
Glucose, Bld: 95 mg/dL (ref 70–99)
Potassium: 3.8 mmol/L (ref 3.5–5.1)
Sodium: 140 mmol/L (ref 135–145)
Total Bilirubin: 0.3 mg/dL (ref 0.0–1.2)
Total Protein: 6.9 g/dL (ref 6.5–8.1)

## 2024-02-15 LAB — CBC WITH DIFFERENTIAL (CANCER CENTER ONLY)
Abs Immature Granulocytes: 0.01 K/uL (ref 0.00–0.07)
Basophils Absolute: 0 K/uL (ref 0.0–0.1)
Basophils Relative: 1 %
Eosinophils Absolute: 0.1 K/uL (ref 0.0–0.5)
Eosinophils Relative: 2 %
HCT: 38.1 % (ref 36.0–46.0)
Hemoglobin: 12.7 g/dL (ref 12.0–15.0)
Immature Granulocytes: 0 %
Lymphocytes Relative: 33 %
Lymphs Abs: 2.1 K/uL (ref 0.7–4.0)
MCH: 30 pg (ref 26.0–34.0)
MCHC: 33.3 g/dL (ref 30.0–36.0)
MCV: 90.1 fL (ref 80.0–100.0)
Monocytes Absolute: 0.5 K/uL (ref 0.1–1.0)
Monocytes Relative: 8 %
Neutro Abs: 3.6 K/uL (ref 1.7–7.7)
Neutrophils Relative %: 56 %
Platelet Count: 251 K/uL (ref 150–400)
RBC: 4.23 MIL/uL (ref 3.87–5.11)
RDW: 20.7 % — ABNORMAL HIGH (ref 11.5–15.5)
WBC Count: 6.3 K/uL (ref 4.0–10.5)
nRBC: 0 % (ref 0.0–0.2)

## 2024-02-15 LAB — FERRITIN: Ferritin: 29 ng/mL (ref 11–307)

## 2024-02-15 LAB — IRON AND TIBC
Iron: 46 ug/dL (ref 28–170)
Saturation Ratios: 14 % (ref 10.4–31.8)
TIBC: 339 ug/dL (ref 250–450)
UIBC: 293 ug/dL

## 2024-02-15 LAB — CEA (ACCESS): CEA (CHCC): 1.74 ng/mL (ref 0.00–5.00)

## 2024-02-15 LAB — D-DIMER, QUANTITATIVE: D-Dimer, Quant: <0.27 ug{FEU}/mL (ref 0.00–0.50)

## 2024-02-15 NOTE — Progress Notes (Unsigned)
 [facility]  HEMATOLOGY/ ONCOLOGY CLINIC CONSULTATION NOTE   PATIENT NAME: Kim [name]   MR#: [id number] DOB: [birth year]  DATE OF SERVICE: 02/15/2024   REFERRING PROVIDER  Ronal Leeroy Marie, FNP  Patient Care Team: Marie Ronal Leeroy, FNP as PCP - General (Internal Medicine) Mollie Nestor HERO, PA-C (Gastroenterology)   REASON FOR CONSULTATION/ CHIEF COMPLAINT:  History of pulmonary embolism diagnosed in September 2025  ASSESSMENT & PLAN:  Kim [name] is a 63 y.o. lady with a past medical history of ***, was referred to our service for evaluation of ***.    No problem-specific Assessment & Plan notes found for this encounter.   Assessment and Plan Assessment & Plan Bilateral pulmonary embolism and left lower extremity deep vein thrombosis Diagnosed in September with no current dyspnea. On Eliquis  twice daily with no missed doses. No recent long travel, bed rest, or estrogen use. Decreased mobility and dehydration may have contributed to clotting risk. Blood work to assess clotting risk factors, including mutations, is planned. - Continue Eliquis  twice daily - Ordered blood tests to assess clotting risk factors - Plan for CT scan in late March to evaluate for residual clots - Plan for ultrasound of the leg in late March - Will consider holding Eliquis  for two days in January for endoscopy and colonoscopy if diarrhea persists  Iron  deficiency anemia Hemoglobin improved to 12.4 in October, within normal range. Iron  levels improved from less than 10 in September to 40 in October. Currently taking iron  supplements once daily. - Continue iron  supplementation once daily - Ordered blood tests to assess current iron  levels  Chronic diarrhea Since hospital discharge, occurring several times a week with sudden onset and loose stools. Negative C. diff test. No recent antibiotic use, nausea, vomiting, or abdominal pain. Possible irritable bowel syndrome  or dietary intolerance. No dietary changes noted. Protonix  prescribed for acid reflux, but not refilled. Imodium suggested for symptomatic relief. 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 No recurrence on recent scans. Follow-up with oncologist Dr. Claryce every two years, with the last visit this year showing clear results. - Continue follow-up with Dr. Claryce every two years   I reviewed lab results and outside records for this visit and discussed relevant results with the patient. Diagnosis, plan of care and treatment options were also discussed in detail with the patient. Opportunity provided to ask questions and answers provided to her apparent satisfaction. Provided instructions to call our clinic with any problems, questions or concerns prior to return visit. I recommended to continue follow-up with PCP and sub-specialists. She verbalized understanding and agreed with the plan. No barriers to learning was detected.  Chinita Patten, MD  02/15/2024 5:59 PM  [facility] - A DEPT OF [facility] [address] [place] [address] Dept: [phone] Dept Fax: [phone]   HISTORY OF PRESENT ILLNESS:  Discussed the use of AI scribe software for clinical note transcription with the patient, who gave verbal consent to proceed.  History of Present Illness Kim [name] is a 63 year old female with a history of pulmonary embolism and  deep vein thrombosis who presents for follow-up regarding blood clots and ongoing diarrhea. She is  accompanied by her friend, Jamie.  In September, she was hospitalized for shortness of breath and diagnosed with pulmonary emboli in both lungs and a deep vein thrombosis in the left leg. At the time of diagnosis, there was no pain or swelling in the leg. She has been consistently taking Eliquis  twice daily and iron  once daily. Her hemoglobin was less than 10 in September and was 12.4 in October, and her iron  level was less than 10 in September and was 40 in October.  Since her hospital discharge, she has experienced persistent diarrhea, beginning immediately after her stay. Episodes occur several times a week with sudden onset and significant urgency, often resulting in 'total liquid' stools. No blood in stools, nausea, vomiting, or abdominal pain, but she notes her stomach is 'really loud.' A recent C. diff test was negative, and she has not been on antibiotics. She has not tried any medications like Imodium yet.  Her past medical history includes appendiceal cancer diagnosed in 2018, treated with surgery and intraperitoneal heated chemotherapy. She has not had any chemotherapy or radiation since then, and recent scans have shown no signs of cancer recurrence. She also has a history of depression, which she initially thought was contributing to her lack of energy prior to the diagnosis of blood clots.  She reports no recent long road trips, flights, or periods of immobility, although she was less active due to fatigue before her hospitalization. She has a history of a hiatal hernia but denies any associated pain or blood loss.   ONCOLOGY HISTORY:  The patient initially complained of abdominal discomfort and was evaluated at an emergency room in [place], [place] .  At that time she was found to be a patient with a history of bipolar disorder with a multiple month history of abdominal distention and right sided abdominal pain.  She attributed this to constipation and regular weight gain. At the  end of December 2017 she presented to the ED- she was seen and referred to an oncologist who then set her up for an exploratory laparotomy with Dr. Eloy on 03/12/16 at which time she was found to have a large ruptured appendix with mucin extruding. A total hysterectomy was performed as well.   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   May 31st, 2018 she underwent: Laparotomy, cytoreductive surgery, partial cystectomy, omentectomy, placement of four chemoperfusion cannulas, hyperthermic intraperitoneal chemotherapy with oxaliplatin following an R1 type resection. She required a partial cystectomy and was discharged with a foley catheter in place.  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  She has remained in remission.  She continues to follow-up with Dr. Dallas Dan at [facility], last seen in April 2025 for follow-up.  On 12/07/2023, she presented to ED with several week history of progressively worsening dyspnea on exertion, generalized fatigue, exertional palpitations and chest pressure with exertion.  She endorsed immobility and not frequently standing to walk around.  CT chest, abdomen and pelvis showed incidental pulmonary emboli noted in all lobes of the right lung and the left upper lobe.  No evidence of right heart strain.  No acute findings in the abdomen or pelvis.  Specifically no signs of disease recurrence from appendiceal carcinoma.  On 12/07/2023, bilateral lower extremity ultrasound showed evidence of acute DVT  involving left femoral vein and left posterior tibial veins.  Age-indeterminate DVT noted in the left popliteal vein.  She was admitted to the hospital and was initially on heparin  drip and later switched to Eliquis .  On her consultation with us  on 02/15/2024, we pursued thrombophilia workup.  Minimum of 6 months of anticoagulation  was recommended.  MEDICAL HISTORY 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   Depression    GERD (gastroesophageal reflux disease)    Headache(784.0)    migraines   Herpes 1986   on patients back side since 1986   History of alcohol abuse 02/15/2024     SURGICAL HISTORY Past Surgical History:  Procedure Laterality Date   ABDOMINAL HYSTERECTOMY N/A 03/12/2016   Procedure: HYSTERECTOMY ABDOMINAL TOTAL , OMENTECTOMY, RADICAL TUMOR DEBULKING;  Surgeon: Maurilio Ship, MD;  Location: [facility];  Service: Gynecology;  Laterality: N/A;   ABDOMINAL SURGERY     for cancer   APPENDECTOMY  03/12/2016   Procedure: APPENDECTOMY;  Surgeon: Maurilio Ship, MD;  Location: [facility];  Service: Gynecology;;   FOOT SURGERY Right    correction old fracture last toe   FRACTURE SURGERY Right    patellar fracture   KNEE SURGERY     right   LAPAROTOMY N/A 03/12/2016   Procedure: EXPLORATORY LAPAROTOMY;  Surgeon: Maurilio Ship, MD;  Location: [facility];  Service: Gynecology;  Laterality: N/A;   MOUTH SURGERY     SALPINGOOPHORECTOMY Bilateral 03/12/2016   Procedure: BILATERAL SALPINGO OOPHORECTOMY;  Surgeon: Maurilio Ship, MD;  Location: [facility];  Service: Gynecology;  Laterality: Bilateral;   tummy tuck     uterine ablation       SOCIAL HISTORY: She reports that she has never smoked. She has never used smokeless tobacco. She reports that she does not currently use drugs after having used the following drugs: Cocaine. She reports that she does not drink alcohol. 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   Drug use: Not Currently    Types: Cocaine    Comment: 2016 last used    Sexual activity: Yes    Birth control/protection: Post-menopausal  Other Topics Concern   Not on file  Social History Narrative   Not on file   Social Drivers of Health   Financial Resource Strain: Not on file  Food Insecurity: No Food Insecurity (02/15/2024)   Hunger Vital Sign    Worried About Running Out of Food in the Last Year: Never true    Ran Out of Food in the Last Year: Never true  Transportation Needs: No Transportation Needs (02/15/2024)   PRAPARE - Administrator, Civil Service (Medical): No    Lack of Transportation (Non-Medical): No  Physical Activity: Not on file  Stress: Not on file  Social Connections: Moderately Integrated (12/06/2023)   Social Connection and  Isolation Panel    Frequency of Communication with Friends and Family: More than three times a week    Frequency of Social Gatherings with Friends and Family: More than three times a week    Attends Religious Services: More than 4 times per year    Active Member of [employer] or Organizations: Yes    Attends Engineer, Structural: More than 4 times per year    Marital Status: Divorced  Intimate Partner Violence: Not At Risk (02/15/2024)   Humiliation, Afraid, Rape, and Kick questionnaire    Fear of Current or Ex-Partner: No    Emotionally Abused: No    Physically Abused: No    Sexually Abused: No    FAMILY HISTORY: Her family history includes Heart attack in her father; Heart disease in her father; High Cholesterol in her mother; Pancreatic cancer in her mother.  CURRENT MEDICATIONS   Current Outpatient Medications  Medication Instructions   atorvastatin  (LIPITOR) 40 mg, Daily at bedtime   busPIRone  (BUSPAR ) 15 mg, 2 times daily   cyanocobalamin  (VITAMIN B12) 1,000 mcg, Oral,  Daily   Eliquis  5 mg, 2 times daily   FeroSul 325 mg, Oral, Daily with breakfast   levothyroxine  (SYNTHROID ) 25 mcg, Daily before breakfast   sertraline  (ZOLOFT ) 50 mg, Daily   traZODone  (DESYREL ) 200 mg, Daily at bedtime     ALLERGIES  She is allergic to morphine  and penicillins.  REVIEW OF SYSTEMS:  Review of Systems - Oncology   Rest of the pertinent review of systems is unremarkable except as mentioned above in HPI.  PHYSICAL EXAMINATION:  ***   Onc Performance Status - 02/15/24 1414       ECOG Perf Status   ECOG Perf Status Ambulatory and capable of all selfcare but unable to carry out any work activities.  Up and about more than 50% of waking hours      KPS SCALE   KPS % SCORE Normal activity with effort, some s/s of disease          Vitals:   02/15/24 1405  BP: 118/83  Pulse: 81  Resp: 16  Temp: 97.8 F (36.6 C)  SpO2: 95%   Filed Weights   02/15/24 1405  Weight: 154 lb 8 oz (70.1 kg)    Physical Exam  ***  LABORATORY DATA:   I have reviewed the data as listed.  Results for orders placed or performed in visit on 02/15/24  CEA (Access)  Result Value Ref Range   CEA ([facility]) 1.74 0.00 - 5.00 ng/mL  D-dimer, quantitative  Result Value Ref Range   D-Dimer, Quant <0.27 0.00 - 0.50 ug/mL-FEU  Ferritin  Result Value Ref Range   Ferritin 29 11 - 307 ng/mL  CMP (Cancer Center only)  Result Value Ref Range   Sodium 140 135 - 145 mmol/L   Potassium 3.8 3.5 - 5.1 mmol/L   Chloride 104 98 - 111 mmol/L   CO2 26 22 - 32 mmol/L   Glucose, Bld 95 70 - 99 mg/dL   BUN 12 8 - 23 mg/dL   Creatinine 9.40 9.55 - 1.00 mg/dL   Calcium  9.6 8.9 - 10.3 mg/dL   Total Protein 6.9 6.5 - 8.1 g/dL   Albumin  4.3 3.5 - 5.0 g/dL   AST 20 15 - 41 U/L   ALT 21 0 - 44 U/L   Alkaline Phosphatase 101 38 - 126 U/L   Total Bilirubin 0.3 0.0 - 1.2 mg/dL   GFR, Estimated >39 >39  mL/min   Anion gap 10 5 - 15  CBC with Differential (Cancer Center Only)  Result Value Ref  Range   WBC Count 6.3 4.0 - 10.5 K/uL   RBC 4.23 3.87 - 5.11 MIL/uL   Hemoglobin 12.7 12.0 - 15.0 g/dL   HCT 61.8 63.9 - 53.9 %   MCV 90.1 80.0 - 100.0 fL   MCH 30.0 26.0 - 34.0 pg   MCHC 33.3 30.0 - 36.0 g/dL   RDW 79.2 (H) 88.4 - 84.4 %   Platelet Count 251 150 - 400 K/uL   nRBC 0.0 0.0 - 0.2 %   Neutrophils Relative % 56 %   Neutro Abs 3.6 1.7 - 7.7 K/uL   Lymphocytes Relative 33 %   Lymphs Abs 2.1 0.7 - 4.0 K/uL   Monocytes Relative 8 %   Monocytes Absolute 0.5 0.1 - 1.0 K/uL   Eosinophils Relative 2 %   Eosinophils Absolute 0.1 0.0 - 0.5 K/uL   Basophils Relative 1 %   Basophils Absolute 0.0 0.0 - 0.1 K/uL   Immature Granulocytes 0 %   Abs Immature Granulocytes 0.01 0.00 - 0.07 K/uL     RADIOGRAPHIC STUDIES:  I have personally reviewed the radiological images as listed and agreed with the findings in the report.  VAS US  LOWER EXTREMITY VENOUS (DVT)  Lower Venous DVT Study  Patient Name:  Kim [name]  Date of Exam:   12/07/2023 Medical Rec #: [id number]        Accession #:    [id number] Date of Birth: 04-29-1960        Patient Gender: F Patient Age:   43 years Exam Location:  [facility] Procedure:      VAS US  LOWER EXTREMITY VENOUS (DVT) Referring Phys: ROCKIE FOUST  --------------------------------------------------------------------------------   Indications: Pulmonary embolism.   Risk Factors: Hx of cancer (appendix/ovarian), decreased activity. Comparison Study: No previous exams  Performing Technologist: Jody Hill RVT, RDMS    Examination Guidelines: A complete evaluation includes B-mode imaging, spectral Doppler, color Doppler, and power Doppler as needed of all accessible portions of each vessel. Bilateral testing is considered an integral part of a complete examination. Limited examinations for reoccurring indications may be performed as noted. The reflux portion of the exam is performed with the patient in reverse Trendelenburg.      +---------+---------------+---------+-----------+----------+--------------+ RIGHT    CompressibilityPhasicitySpontaneityPropertiesThrombus Aging +---------+---------------+---------+-----------+----------+--------------+ CFV      Full           Yes      Yes                                 +---------+---------------+---------+-----------+----------+--------------+ SFJ      Full                                                        +---------+---------------+---------+-----------+----------+--------------+ FV Prox  Full           Yes      Yes                                 +---------+---------------+---------+-----------+----------+--------------+ FV Mid   Full           Yes  Yes                                 +---------+---------------+---------+-----------+----------+--------------+ FV DistalFull           Yes      Yes                                 +---------+---------------+---------+-----------+----------+--------------+ PFV      Full                                                        +---------+---------------+---------+-----------+----------+--------------+ POP      Full           Yes      Yes                                 +---------+---------------+---------+-----------+----------+--------------+ PTV      Full                                                        +---------+---------------+---------+-----------+----------+--------------+ PERO     Full                                                        +---------+---------------+---------+-----------+----------+--------------+        +---------+---------------+---------+-----------+----------+-----------------+ LEFT     CompressibilityPhasicitySpontaneityPropertiesThrombus Aging    +---------+---------------+---------+-----------+----------+-----------------+ CFV      Full           Yes      Yes                                     +---------+---------------+---------+-----------+----------+-----------------+ SFJ      Full                                                           +---------+---------------+---------+-----------+----------+-----------------+ FV Prox  Full           Yes      Yes                                    +---------+---------------+---------+-----------+----------+-----------------+ FV Mid   None           No       No                   Acute             +---------+---------------+---------+-----------+----------+-----------------+ FV DistalPartial        No  No                   Acute             +---------+---------------+---------+-----------+----------+-----------------+ PFV      Full                                                           +---------+---------------+---------+-----------+----------+-----------------+ POP      None           No       No                   Age Indeterminate +---------+---------------+---------+-----------+----------+-----------------+ PTV      None           No       No                   Acute             +---------+---------------+---------+-----------+----------+-----------------+ PERO     Full                                                           +---------+---------------+---------+-----------+----------+-----------------+             Summary: BILATERAL: -No evidence of popliteal cyst, bilaterally. RIGHT:  - There is no evidence of deep vein thrombosis in the lower extremity.   LEFT: - Findings consistent with acute deep vein thrombosis involving the left femoral vein, and left posterior tibial veins.   - Findings consistent with age indeterminate deep vein thrombosis involving the left popliteal vein.       *See table(s) above for measurements and observations.  Electronically signed by Debby Robertson on 12/07/2023 at 5:18:27 PM.      Final   ECHOCARDIOGRAM COMPLETE     ECHOCARDIOGRAM REPORT       Patient Name:   Kim [name] Date of Exam: 12/07/2023 Medical Rec #:  [id number]       Height:       61.0 in Accession #:    [id number]      Weight:       145.0 lb Date of Birth:  02/24/1961       BSA:          1.647 m Patient Age:    63 years        BP:           109/70 mmHg Patient Gender: F               HR:           105 bpm. Exam Location:  Inpatient  Procedure: 2D Echo, Cardiac Doppler and Color Doppler (Both Spectral and Color            Flow Doppler were utilized during procedure).  Indications:    Pulmonary embolis   History:        Patient has no prior history of Echocardiogram examinations.                 Arrythmias:Tachycardia, Signs/Symptoms:Dyspnea and Fatigue; Risk  Factors:Non-Smoker.   Sonographer:    Juliene Rucks Referring Phys: [id number] EVA KATHEE PORE    Sonographer Comments: Suboptimal apical window and suboptimal subcostal window. Image acquisition challenging due to uncooperative patient. IMPRESSIONS   1. Left ventricular ejection fraction, by estimation, is 70 to 75%. The left ventricle has hyperdynamic function. Left ventricular endocardial border not optimally defined to evaluate regional wall motion. Left ventricular diastolic parameters are  consistent with Grade I diastolic dysfunction (impaired relaxation). Unable to assess A2C view due to patient discomfort.  2. Right ventricular systolic function is hyperdynamic. The right ventricular size is normal.  3. The mitral valve is grossly normal. No evidence of mitral valve regurgitation. No evidence of mitral stenosis.  4. The aortic valve was not well visualized. There is moderate calcification of the aortic valve. Aortic valve regurgitation is not visualized. Aortic valve sclerosis is present, with no evidence of aortic valve stenosis.  Comparison(s): No prior Echocardiogram.  FINDINGS  Left Ventricle: Left ventricular ejection fraction, by estimation, is  70 to 75%. The left ventricle has hyperdynamic function. Left ventricular endocardial border not optimally defined to evaluate regional wall motion. The left ventricular internal  cavity size was normal in size. There is no left ventricular hypertrophy. Left ventricular diastolic parameters are consistent with Grade I diastolic dysfunction (impaired relaxation).  Right Ventricle: The right ventricular size is normal. No increase in right ventricular wall thickness. Right ventricular systolic function is hyperdynamic.  Left Atrium: Left atrial size was normal in size.  Right Atrium: Right atrial size was normal in size.  Pericardium: Trivial pericardial effusion is present.  Mitral Valve: The mitral valve is grossly normal. No evidence of mitral valve regurgitation. No evidence of mitral valve stenosis.  Tricuspid Valve: The tricuspid valve is normal in structure. Tricuspid valve regurgitation is mild . No evidence of tricuspid stenosis.  Aortic Valve: The aortic valve was not well visualized. There is moderate calcification of the aortic valve. Aortic valve regurgitation is not visualized. Aortic valve sclerosis is present, with no evidence of aortic valve stenosis.  Pulmonic Valve: The pulmonic valve was not well visualized. Pulmonic valve regurgitation is not visualized. No evidence of pulmonic stenosis.  Aorta: The aortic root and ascending aorta are structurally normal, with no evidence of dilitation.  IAS/Shunts: The atrial septum is grossly normal.    LEFT VENTRICLE PLAX 2D LVIDd:         3.66 cm LVIDs:         2.10 cm LV PW:         0.97 cm LV IVS:        0.99 cm LVOT diam:     1.99 cm LVOT Area:     3.11 cm   LV Volumes (MOD) LV vol d, MOD A4C: 88.6 ml LV vol s, MOD A4C: 29.7 ml LV SV MOD A4C:     88.6 ml  RIGHT VENTRICLE RV Basal diam:  2.90 cm RV Mid diam:    2.51 cm  LEFT ATRIUM           Index        RIGHT ATRIUM          Index LA diam:      2.57 cm 1.56 cm/m    RA Area:     8.37 cm LA Vol (A4C): 36.7 ml 22.28 ml/m  RA Volume:   13.70 ml 8.32 ml/m    AORTA Ao Root diam: 2.70 cm Ao Asc diam:  2.65 cm  SHUNTS Systemic Diam: 1.99 cm  Stanly Leavens MD Electronically signed by Stanly Leavens MD Signature Date/Time: 12/07/2023/1:37:09 PM      Final     Orders Placed This Encounter  Procedures   CBC with Differential (Cancer Center Only)    Standing Status:   Future    Number of Occurrences:   1    Expiration Date:   02/14/2025   CMP (Cancer Center only)    Standing Status:   Future    Number of Occurrences:   1    Expiration Date:   02/14/2025   Iron  and TIBC    Standing Status:   Future    Number of Occurrences:   1    Expiration Date:   02/14/2025   Ferritin    Standing Status:   Future    Number of Occurrences:   1    Expiration Date:   02/14/2025   D-dimer, quantitative    Standing Status:   Future    Number of Occurrences:   1    Expiration Date:   02/14/2025   Beta-2 -glycoprotein i abs, IgG/M/A    Standing Status:   Future    Number of Occurrences:   1    Expiration Date:   02/14/2025   Cardiolipin antibodies, IgG, IgM, IgA    Standing Status:   Future    Number of Occurrences:   1    Expiration Date:   02/14/2025   Prothrombin gene mutation    Standing Status:   Future    Number of Occurrences:   1    Expiration Date:   02/14/2025   Factor 5 leiden    Standing Status:   Future    Number of Occurrences:   1    Expiration Date:   02/14/2025   Antithrombin panel    Standing Status:   Future    Number of Occurrences:   1    Expiration Date:   02/14/2025   Protein C activity    Standing Status:   Future    Number of Occurrences:   1    Expiration Date:   02/14/2025   PROTEIN S PANEL    Standing Status:   Future    Number of Occurrences:   1    Expiration Date:   02/14/2025   CEA (Access)    Standing Status:   Future    Number of Occurrences:   1    Expiration Date:   02/14/2025    Future  Appointments  Date Time Provider Department Center  02/28/2024  3:30 PM Autumn Millman, MD [facility] None  06/20/2024  2:00 PM [facility] PHLEBOTOMIST [facility] None  06/20/2024  2:30 PM Yareth Macdonnell, MD [facility] None    I spent a total of 55 minutes during this encounter with the patient including review of chart and various tests results, discussions about plan of care and coordination of care plan.  This document was completed utilizing speech recognition software. Grammatical errors, random word insertions, pronoun errors, and incomplete sentences are an occasional consequence of this system due to software limitations, ambient noise, and hardware issues. Any formal questions or concerns about the content, text or information contained within the body of this dictation should be directly addressed to the provider for clarification.

## 2024-02-17 LAB — PROTEIN S PANEL
Protein S Activity: 109 % (ref 63–140)
Protein S Ag, Free: 97 % (ref 61–136)
Protein S Ag, Total: 81 % (ref 60–150)

## 2024-02-17 LAB — BETA-2-GLYCOPROTEIN I ABS, IGG/M/A
Beta-2 Glyco I IgG: <9 GPI IgG units (ref 0–20)
Beta-2-Glycoprotein I IgA: <9 GPI IgA units (ref 0–25)
Beta-2-Glycoprotein I IgM: <9 GPI IgM units (ref 0–32)

## 2024-02-17 LAB — FACTOR 5 LEIDEN

## 2024-02-17 LAB — ANTITHROMBIN PANEL
AT III AG PPP IMM-ACNC: 118 % (ref 72–124)
Antithrombin Activity: 181 % — ABNORMAL HIGH (ref 75–135)

## 2024-02-17 LAB — PROTEIN C ACTIVITY: Protein C Activity: 193 % — ABNORMAL HIGH (ref 73–180)

## 2024-02-18 ENCOUNTER — Other Ambulatory Visit: Payer: Self-pay

## 2024-02-18 DIAGNOSIS — I824Y2 Acute embolism and thrombosis of unspecified deep veins of left proximal lower extremity: Secondary | ICD-10-CM

## 2024-02-18 LAB — CARDIOLIPIN ANTIBODIES, IGG, IGM, IGA
Anticardiolipin IgA: <9 U/mL (ref 0–11)
Anticardiolipin IgG: <9 GPL U/mL (ref 0–14)
Anticardiolipin IgM: <9 [MPL'U]/mL (ref 0–12)

## 2024-02-18 LAB — PROTHROMBIN GENE MUTATION

## 2024-02-23 ENCOUNTER — Encounter: Payer: Self-pay | Admitting: Gastroenterology

## 2024-02-28 ENCOUNTER — Encounter: Payer: Self-pay | Admitting: Oncology

## 2024-02-28 ENCOUNTER — Inpatient Hospital Stay: Admitting: Oncology

## 2024-02-28 DIAGNOSIS — I824Y2 Acute embolism and thrombosis of unspecified deep veins of left proximal lower extremity: Secondary | ICD-10-CM

## 2024-02-28 DIAGNOSIS — I2699 Other pulmonary embolism without acute cor pulmonale: Secondary | ICD-10-CM | POA: Diagnosis not present

## 2024-02-28 DIAGNOSIS — C181 Malignant neoplasm of appendix: Secondary | ICD-10-CM

## 2024-02-28 NOTE — Progress Notes (Signed)
 "  [facility]  HEMATOLOGY-ONCOLOGY ELECTRONIC VISIT PROGRESS NOTE  PATIENT NAME: Kim [name]   MR#: [id number] DOB: [birth year]  DATE OF SERVICE: 02/28/2024  Patient Care Team: Royden Ronal Czar, FNP as PCP - General (Internal Medicine) Mollie Nestor CHRISTELLA DEVONNA (Gastroenterology)  I connected with the patient via telephone conference and verified that I am speaking with the correct person using two identifiers. The patient's location is at home and I am providing care from the [facility].  I discussed the limitations, risks, security and privacy concerns of performing an evaluation and management service by e-visits and the availability of in person appointments. I also discussed with the patient that there may be a patient responsible charge related to this service. The patient expressed understanding and agreed to proceed.   ASSESSMENT & PLAN:   Kim [name] is a 63 y.o. lady with a past medical history of low-grade appendiceal cancer, status post laparotomy, cytoreductive surgery, omentectomy, intraperitoneal chemotherapy with oxaliplatin in May 2018, in remission, diagnosed with pulmonary emboli in September 2025, was referred to our service for evaluation of possible thrombophilia state.    Bilateral pulmonary embolism (HCC) Diagnosed in September 2025 with no current dyspnea. On Eliquis  twice daily with no missed doses. No recent long travel, bed rest, or estrogen use. Decreased mobility and dehydration may have contributed to clotting risk.   On her consultation with us  on 02/15/2024, we pursued thrombophilia workup.  Prothrombin gene mutation, factor V Leiden mutation were negative.  No evidence of beta-2  glycoprotein antibodies or anticardiolipin antibodies.  Protein C activity, protein S activity, Antithrombin III activity were within normal limits.  D-dimer was undetectable.  Grossly negative thrombophilia workup.  - Continue Eliquis  twice daily  -  Plan for CT scan in late March to evaluate for residual clots  - Plan for ultrasound of the leg in late March  - Will consider holding Eliquis  for two days in January for endoscopy and colonoscopy if diarrhea persists  Appendix carcinoma [facility]) May 31st, 2018 she underwent: Laparotomy, cytoreductive surgery, partial cystectomy, omentectomy, placement of four chemoperfusion cannulas, hyperthermic intraperitoneal chemotherapy with oxaliplatin following an R1 type resection. She required a partial cystectomy and was discharged with a foley catheter in place.  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  She has remained in remission.  She continues to follow-up with Dr. Dallas Dan at [facility], last seen in April 2025 for follow-up.  Appendiceal cancer treated with intraperitoneal chemotherapy in 2018. No recurrence on recent scans.   Chronic diarrhea She has avoided antidiarrheal medication due to concerns about constipation given prior history of opposite bowel pattern. - Discussed use of loperamide for symptomatic relief. - Advised to purchase loperamide as needed.   I discussed the assessment and treatment plan with the patient. The patient was provided an opportunity to ask questions and all were answered. The patient agreed with the plan and demonstrated an understanding of the instructions. The patient was advised to call back or seek an in-person evaluation if the symptoms worsen or if the condition fails to improve as anticipated.    I spent 12 minutes over the phone with the patient reviewing test results, discuss management and coordination/planning of care.  Chinita Patten, MD 02/28/2024 3:44 PM [facility] - A DEPT OF [place] [address] [place]  [place] [address] Dept:  [phone] Dept Fax: [phone]   INTERVAL HISTORY:  Please see above for problem oriented charting.  The purpose of today's discussion is to explain recent lab results and to formulate plan of care.  Discussed the use of AI scribe software for clinical note transcription with the patient, who gave verbal consent to proceed.  History of Present Illness Kim [name] is a 63 year old female with appendix carcinoma and recent thromboembolic events presenting for hematology/oncology follow-up.  She is undergoing ongoing management for appendix carcinoma. Recent laboratory evaluation, including CEA tumor marker, was within normal limits.  She continues to experience persistent diarrhea and loose stools, which she attributes to anticoagulation therapy. She has avoided antidiarrheal medications due to concerns about precipitating constipation, referencing a prior history of constipation. She denies any new or worsening symptoms related to malignancy or thromboembolic disease.  She previously experienced decreased mobility and low energy prior to the diagnosis of her clot, which she associates with previously low blood counts. Currently, she feels well overall and does not endorse ongoing fatigue or malaise. She has not experienced any new symptoms suggestive of recurrent thromboembolism. Laboratory workup, including blood counts, chemistries, and thrombophilia panel, was unremarkable, and D-dimer has normalized from a previously elevated value to undetectable.   SUMMARY OF ONCOLOGIC HISTORY:  The patient initially complained of abdominal discomfort and was evaluated at an emergency room in [place], [place] .  At that time she was found to be a patient with a history of bipolar disorder with a multiple month history of abdominal distention and right sided abdominal pain.  She attributed this to constipation and regular weight gain. At the end of December 2017 she presented to the ED-  she was seen and referred to an oncologist who then set her up for an exploratory laparotomy with Dr. Eloy on 03/12/16 at which time she was found to have a large ruptured appendix with mucin extruding. A total hysterectomy was performed as well.    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   May 31st, 2018 she underwent: Laparotomy, cytoreductive surgery, partial cystectomy, omentectomy, placement of four chemoperfusion cannulas, hyperthermic intraperitoneal chemotherapy with oxaliplatin following an R1 type resection. She required a partial cystectomy and was discharged with a foley catheter in place.  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   She has remained in remission.  She continues to follow-up with Dr. Dallas Dan at [facility], last seen in April 2025 for follow-up.   On 12/07/2023, she presented to ED with several week history of progressively worsening dyspnea on exertion, generalized fatigue, exertional palpitations and chest pressure with exertion.  She endorsed immobility and not frequently standing to walk around.  CT chest, abdomen and pelvis showed incidental pulmonary emboli noted in all lobes of the right lung and the left upper lobe.  No evidence of right heart strain.  No acute findings in the abdomen or pelvis.  Specifically no signs of disease recurrence from appendiceal carcinoma.   On 12/07/2023, bilateral lower extremity ultrasound showed evidence of acute DVT involving left femoral vein and left posterior tibial veins.  Age-indeterminate DVT noted in the left popliteal vein.   She was admitted to the hospital and was initially on heparin  drip and later switched to Eliquis .   On her consultation with us  on 02/15/2024, we pursued thrombophilia workup.  Prothrombin gene mutation,  factor V Leiden mutation were negative.  No evidence  of beta-2  glycoprotein antibodies or anticardiolipin antibodies.  Protein C activity, protein S activity, Antithrombin III activity were within normal limits.  D-dimer was undetectable.  Grossly negative thrombophilia workup.   Minimum of 6 months of anticoagulation was recommended.  Oncology History  Appendix carcinoma (HCC)  06/17/2016 Initial Diagnosis   Appendix carcinoma (HCC)   02/28/2024 Cancer Staging   Staging form: Appendix - Carcinoma, AJCC 8th Edition - Pathologic: Stage IVA (pT4a, pN0, pM1b, G1) - Signed by Autumn Millman, MD on 02/28/2024 Total positive nodes: 0 Histologic grading system: 3 grade system Residual tumor (R): R1     REVIEW OF SYSTEMS:    Review of Systems - Oncology  All other pertinent systems were reviewed with the patient and are negative.  I have reviewed the past medical history, past surgical history, social history and family history with the patient and they are unchanged from previous note.  ALLERGIES:  She is allergic to morphine  and penicillins.  MEDICATIONS:  Current Outpatient Medications  Medication Sig Dispense Refill   atorvastatin  (LIPITOR) 80 MG tablet Take 40 mg by mouth at bedtime.     busPIRone  (BUSPAR ) 15 MG tablet Take 15 mg by mouth 2 (two) times daily.     cyanocobalamin  1000 MCG tablet Take 1 tablet (1,000 mcg total) by mouth daily. 30 tablet 1   ELIQUIS  5 MG TABS tablet Take 5 mg by mouth 2 (two) times daily.     ferrous sulfate  325 (65 FE) MG tablet Take 1 tablet (325 mg total) by mouth daily with breakfast. 30 tablet 1   levothyroxine  (SYNTHROID ) 25 MCG tablet Take 25 mcg by mouth daily before breakfast.     sertraline  (ZOLOFT ) 50 MG tablet Take 50 mg by mouth daily.     traZODone  (DESYREL ) 100 MG tablet Take 200 mg by mouth at bedtime.     No current facility-administered medications for this visit.    PHYSICAL EXAMINATION:  Not performed today as it was a phone only visit  LABORATORY DATA:   I have reviewed the  data as listed.  Recent Results (from the past 2160 hours)  Basic metabolic panel     Status: Abnormal   Collection Time: 12/06/23  1:49 PM  Result Value Ref Range   Sodium 138 135 - 145 mmol/L   Potassium 3.3 (L) 3.5 - 5.1 mmol/L   Chloride 104 98 - 111 mmol/L   CO2 21 (L) 22 - 32 mmol/L   Glucose, Bld 98 70 - 99 mg/dL    Comment: Glucose reference range applies only to samples taken after fasting for at least 8 hours.   BUN 10 8 - 23 mg/dL   Creatinine, Ser 9.35 0.44 - 1.00 mg/dL   Calcium  9.0 8.9 - 10.3 mg/dL   GFR, Estimated >39 >39 mL/min    Comment: (NOTE) Calculated using the CKD-EPI Creatinine Equation (2021)    Anion gap 13 5 - 15    Comment: Performed at [facility], [address] [place], [place] [address]  CBC     Status: Abnormal   Collection Time: 12/06/23  1:49 PM  Result Value Ref Range   WBC 7.9 4.0 - 10.5 K/uL   RBC 3.12 (L) 3.87 - 5.11 MIL/uL   Hemoglobin 7.1 (L) 12.0 - 15.0 g/dL   HCT 75.1 (L) 63.9 - 53.9 %   MCV 79.5 (L) 80.0 - 100.0 fL   MCH 22.8 (L) 26.0 - 34.0  pg   MCHC 28.6 (L) 30.0 - 36.0 g/dL   RDW 83.8 (H) 88.4 - 84.4 %   Platelets 366 150 - 400 K/uL   nRBC 0.0 0.0 - 0.2 %    Comment: Performed at [facility], [address], [place], [place] [address]  Vitamin B12     Status: None   Collection Time: 12/06/23  4:30 PM  Result Value Ref Range   Vitamin B-12 273 180 - 914 pg/mL    Comment: (NOTE) This assay is not validated for testing neonatal or myeloproliferative syndrome specimens for Vitamin B12 levels. Performed at [facility], [address], [place], [place] [address]   Folate     Status: None   Collection Time: 12/06/23  4:30 PM  Result Value Ref Range   Folate 19.4 >5.9 ng/mL    Comment: Performed at [facility], [address] [place], [place] [address]  Iron  and TIBC     Status: Abnormal   Collection Time: 12/06/23  4:30 PM  Result Value Ref Range   Iron  <10 (L) 28 -  170 ug/dL    Comment: REPEATED TO VERIFY   TIBC 480 (H) 250 - 450 ug/dL   Saturation Ratios NOT CALCULATED 10.4 - 31.8 %   UIBC NOT CALCULATED ug/dL    Comment: Performed at [facility], [address] [place], [place] [address]  Ferritin     Status: None   Collection Time: 12/06/23  4:30 PM  Result Value Ref Range   Ferritin 17 11 - 307 ng/mL    Comment: Performed at [facility], [address], [place], [place] [address]  Reticulocytes     Status: Abnormal   Collection Time: 12/06/23  4:30 PM  Result Value Ref Range   Retic Ct Pct 2.7 0.4 - 3.1 %   RBC. 3.14 (L) 3.87 - 5.11 MIL/uL   Retic Count, Absolute 84.2 19.0 - 186.0 K/uL   Immature Retic Fract 26.4 (H) 2.3 - 15.9 %    Comment: Performed at [facility], [address] [place], [place] [address]  Lactate dehydrogenase     Status: Abnormal   Collection Time: 12/06/23  4:30 PM  Result Value Ref Range   LDH 239 (H) 98 - 192 U/L    Comment: HEMOLYSIS AT THIS LEVEL MAY AFFECT RESULT Performed at [facility], [address] [place], [place] [address]   Haptoglobin     Status: None   Collection Time: 12/06/23  4:30 PM  Result Value Ref Range   Haptoglobin 138 37 - 355 mg/dL    Comment: (NOTE) Performed At: [facility] [address] [place], [place] [id number] Jennette Shorter MD Ey:[phone]   Hepatic function panel     Status: None   Collection Time: 12/06/23  4:30 PM  Result Value Ref Range   Total Protein 6.9 6.5 - 8.1 g/dL   Albumin  4.4 3.5 - 5.0 g/dL   AST 26 15 - 41 U/L   ALT 14 0 - 44 U/L   Alkaline Phosphatase 102 38 - 126 U/L   Total Bilirubin 0.2 0.0 - 1.2 mg/dL   Bilirubin, Direct <9.8 0.0 - 0.2 mg/dL   Indirect Bilirubin NOT CALCULATED 0.3 - 0.9 mg/dL    Comment: Performed at [facility], [address] [place], [place] [address]  Troponin T, High Sensitivity     Status: None   Collection Time: 12/06/23   4:30 PM  Result Value Ref Range   Troponin T High Sensitivity <15 0 - 19 ng/L    Comment: (NOTE) Biotin concentrations > 1000 ng/mL falsely decrease TnT results.  Serial cardiac troponin measurements are suggested.  Refer to the Links section for chest pain algorithms and additional  guidance. Performed at [facility], [address], [place], [place] [address]   D-dimer, quantitative     Status: Abnormal   Collection Time: 12/06/23  4:30 PM  Result Value Ref Range   D-Dimer, Quant 4.76 (H) 0.00 - 0.50 ug/mL-FEU    Comment: (NOTE) At the manufacturer cut-off value of 0.5 g/mL FEU, this assay has a negative predictive value of 95-100%.This assay is intended for use in conjunction with a clinical pretest probability (PTP) assessment model to exclude pulmonary embolism (PE) and deep venous thrombosis (DVT) in outpatients suspected of PE or DVT. Results should be correlated with clinical presentation. Performed at [facility], [address] [place], [place] [address]   Occult blood card to lab, stool     Status: None   Collection Time: 12/06/23  4:40 PM  Result Value Ref Range   Fecal Occult Bld NEGATIVE NEGATIVE    Comment: Performed at [facility], [address] [place], [place] [address]  Urinalysis, w/ Reflex to Culture (Infection Suspected) -Urine, Clean Catch     Status: Abnormal   Collection Time: 12/06/23  4:40 PM  Result Value Ref Range   Specimen Source URINE, CLEAN CATCH    Color, Urine YELLOW YELLOW   APPearance CLEAR CLEAR   Specific Gravity, Urine 1.022 1.005 - 1.030   pH 5.5 5.0 - 8.0   Glucose, UA NEGATIVE NEGATIVE mg/dL   Hgb urine dipstick NEGATIVE NEGATIVE   Bilirubin Urine NEGATIVE NEGATIVE   Ketones, ur NEGATIVE NEGATIVE mg/dL   Protein, ur NEGATIVE NEGATIVE mg/dL   Nitrite NEGATIVE NEGATIVE   Leukocytes,Ua MODERATE (A) NEGATIVE   RBC / HPF 0-5 0 - 5 RBC/hpf   WBC, UA 11-20 0 - 5  WBC/hpf    Comment:        Reflex urine culture not performed if WBC <=10, OR if Squamous epithelial cells >5. If Squamous epithelial cells >5 suggest recollection.    Bacteria, UA NONE SEEN NONE SEEN   Squamous Epithelial / HPF 0-5 0 - 5 /HPF   Mucus PRESENT     Comment: Performed at [facility], [address], [place], [place] [address]  Urine Culture     Status: None   Collection Time: 12/06/23  4:40 PM   Specimen: Urine, Clean Catch  Result Value Ref Range   Specimen Description      URINE, CLEAN CATCH Performed at [facility], [address], [place], [place] [address]    Special Requests      NONE Reflexed from [id number] Performed at [facility], [address], [place], [place] [address]    Culture      NO GROWTH Performed at [facility], [address], [place], [place] [address]    Report Status 12/07/2023 FINAL   Troponin T, High Sensitivity     Status: None   Collection Time: 12/06/23  6:31 PM  Result Value Ref Range   Troponin T High Sensitivity <15 0 - 19 ng/L    Comment: (NOTE) Biotin concentrations > 1000 ng/mL falsely decrease TnT results.  Serial cardiac troponin measurements are suggested.  Refer to the Links section for chest pain algorithms and  additional  guidance. Performed at [facility], [address] [place], [place] [address]   Comprehensive metabolic panel with GFR     Status: Abnormal   Collection Time: 12/07/23  2:01 AM  Result Value Ref Range   Sodium 137 135 - 145 mmol/L   Potassium 3.4 (L) 3.5 - 5.1 mmol/L   Chloride 104 98 - 111 mmol/L   CO2 21 (L) 22 - 32 mmol/L   Glucose, Bld 129 (H) 70 - 99 mg/dL    Comment: Glucose reference range applies only to samples taken after fasting for at least 8 hours.   BUN 12 8 - 23 mg/dL   Creatinine, Ser 9.21 0.44 - 1.00 mg/dL   Calcium  8.4 (L) 8.9 - 10.3 mg/dL   Total Protein 5.9 (L) 6.5 - 8.1 g/dL   Albumin  3.3 (L) 3.5  - 5.0 g/dL   AST 19 15 - 41 U/L   ALT 13 0 - 44 U/L   Alkaline Phosphatase 75 38 - 126 U/L   Total Bilirubin 0.4 0.0 - 1.2 mg/dL   GFR, Estimated >39 >39 mL/min    Comment: (NOTE) Calculated using the CKD-EPI Creatinine Equation (2021)    Anion gap 12 5 - 15    Comment: Performed at [facility], [address] [place], [place] [address]  Magnesium      Status: None   Collection Time: 12/07/23  2:01 AM  Result Value Ref Range   Magnesium  1.9 1.7 - 2.4 mg/dL    Comment: Performed at [facility], [address] [place], [place] [address]  Heparin  level (unfractionated)     Status: None   Collection Time: 12/07/23  2:10 AM  Result Value Ref Range   Heparin  Unfractionated 0.64 0.30 - 0.70 IU/mL    Comment: (NOTE) The clinical reportable range upper limit is being lowered to >1.10 to align with the FDA approved guidance for the current laboratory assay.  If heparin  results are below expected values, and patient dosage has  been confirmed, suggest follow up testing of antithrombin III levels. Performed at [facility], [address], [place], [place] [address]   CBC with Differential/Platelet     Status: Abnormal   Collection Time: 12/07/23  2:10 AM  Result Value Ref Range   WBC 12.7 (H) 4.0 - 10.5 K/uL   RBC 2.93 (L) 3.87 - 5.11 MIL/uL   Hemoglobin 6.8 (LL) 12.0 - 15.0 g/dL    Comment: REPEATED TO VERIFY This critical result has been called to CHARM JURIST RN by Ronal Rumalda Chang on 12/07/2023 03:07:02, and has been read back.    HCT 22.9 (L) 36.0 - 46.0 %   MCV 78.2 (L) 80.0 - 100.0 fL   MCH 23.2 (L) 26.0 - 34.0 pg   MCHC 29.7 (L) 30.0 - 36.0 g/dL   RDW 83.9 (H) 88.4 - 84.4 %   Platelets 352 150 - 400 K/uL   nRBC 0.0 0.0 - 0.2 %   Neutrophils Relative % 65 %   Neutro Abs 8.4 (H) 1.7 - 7.7 K/uL   Lymphocytes Relative 26 %   Lymphs Abs 3.3 0.7 - 4.0 K/uL   Monocytes Relative 6 %   Monocytes Absolute 0.7 0.1 - 1.0 K/uL   Eosinophils Relative 1 %    Eosinophils Absolute 0.1 0.0 - 0.5 K/uL   Basophils Relative 1 %   Basophils Absolute 0.1 0.0 - 0.1 K/uL   Immature Granulocytes 1 %   Abs  Immature Granulocytes 0.07 0.00 - 0.07 K/uL    Comment: Performed at [facility], [address], [place], [place] [address]  Type and screen [facility]     Status: None   Collection Time: 12/07/23  6:40 AM  Result Value Ref Range   ABO/RH(D) O POS    Antibody Screen NEG    Sample Expiration 12/10/2023,2359    Unit Number [id number]    Blood Component Type RED CELLS,LR    Unit division 00    Status of Unit ISSUED,FINAL    Transfusion Status OK TO TRANSFUSE    Crossmatch Result      Compatible Performed at [facility], [address], [place], [place] [address]   BPAM RBC     Status: None   Collection Time: 12/07/23  6:40 AM  Result Value Ref Range   ISSUE DATE / TIME [id number]    Blood Product Unit Number [id number]    PRODUCT CODE [id number]    Unit Type and Rh 5100    Blood Product Expiration Date [id number]   Prepare RBC (crossmatch)     Status: None   Collection Time: 12/07/23  6:41 AM  Result Value Ref Range   Order Confirmation      ORDER PROCESSED BY BLOOD BANK Performed at [facility], [address], [place], [place] [address]   Hemoglobin and hematocrit, blood     Status: Abnormal   Collection Time: 12/07/23  9:31 AM  Result Value Ref Range   Hemoglobin 6.5 (LL) 12.0 - 15.0 g/dL    Comment: REPEATED TO VERIFY CRITICAL VALUE NOTED.  VALUE IS CONSISTENT WITH PREVIOUSLY REPORTED AND CALLED VALUE.    HCT 22.2 (L) 36.0 - 46.0 %    Comment: Performed at [facility], [address] [place], [place] [address]  Heparin  level (unfractionated)     Status: None   Collection Time: 12/07/23  9:31 AM  Result Value Ref Range   Heparin  Unfractionated 0.53 0.30 - 0.70 IU/mL    Comment: (NOTE) The clinical reportable range upper limit is being lowered to >1.10 to align with the FDA  approved guidance for the current laboratory assay.  If heparin  results are below expected values, and patient dosage has  been confirmed, suggest follow up testing of antithrombin III levels. Performed at [facility], [address] [place], [place] [address]   Vitamin B12     Status: None   Collection Time: 12/07/23  9:31 AM  Result Value Ref Range   Vitamin B-12 247 180 - 914 pg/mL    Comment: (NOTE) This assay is not validated for testing neonatal or myeloproliferative syndrome specimens for Vitamin B12 levels. Performed at [facility], [address], [place], [place] [address]   Folate     Status: None   Collection Time: 12/07/23  9:31 AM  Result Value Ref Range   Folate 13.6 >5.9 ng/mL    Comment: Performed at [facility], [address] [place], [place] [address]  Iron  and TIBC     Status: Abnormal   Collection Time: 12/07/23  9:31 AM  Result Value Ref Range   Iron  <10 (L) 28 - 170 ug/dL   TIBC 578 749 - 549 ug/dL   Saturation Ratios NOT CALCULATED 10.4 - 31.8 %   UIBC NOT CALCULATED ug/dL    Comment: Performed at [facility], [address] [place], [place] [address]  Ferritin     Status: Abnormal   Collection Time: 12/07/23  9:31 AM  Result Value Ref Range   Ferritin 10 (L) 11 - 307 ng/mL    Comment: Performed at [facility], [address] [place], [place] [address]  HIV Antibody (routine testing w rflx)     Status: None   Collection Time: 12/07/23  9:31 AM  Result Value Ref Range   HIV Screen 4th Generation wRfx Non Reactive Non Reactive    Comment: Performed at [facility], [address], [place], [place] [address]  Resp panel by RT-PCR (RSV, Flu A&B, Covid) Anterior Nasal Swab     Status: None   Collection Time: 12/07/23  9:37 AM   Specimen: Anterior Nasal Swab  Result Value Ref Range   SARS Coronavirus 2 by RT PCR NEGATIVE NEGATIVE   Influenza A by PCR NEGATIVE NEGATIVE   Influenza B by PCR NEGATIVE NEGATIVE     Comment: (NOTE) The Xpert Xpress SARS-CoV-2/FLU/RSV plus assay is intended as an aid in the diagnosis of influenza from Nasopharyngeal swab specimens and should not be used as a sole basis for treatment. Nasal washings and aspirates are unacceptable for Xpert Xpress SARS-CoV-2/FLU/RSV testing.  Fact Sheet for Patients: bloggercourse.com  Fact Sheet for Healthcare Providers: seriousbroker.it  This test is not yet approved or cleared by the [place]  FDA and has been authorized for detection and/or diagnosis of SARS-CoV-2 by FDA under an Emergency Use Authorization (EUA). This EUA will remain in effect (meaning this test can be used) for the duration of the COVID-19 declaration under Section 564(b)(1) of the Act, 21 U.S.C. section 360bbb-3(b)(1), unless the authorization is terminated or revoked.     Resp Syncytial Virus by PCR NEGATIVE NEGATIVE    Comment: (NOTE) Fact Sheet for Patients: bloggercourse.com  Fact Sheet for Healthcare Providers: seriousbroker.it  This test is not yet approved or cleared by the [place]  FDA and has been authorized for detection and/or diagnosis of SARS-CoV-2 by FDA under an Emergency Use Authorization (EUA). This EUA will remain in effect (meaning this test can be used) for the duration of the COVID-19 declaration under Section 564(b)(1) of the Act, 21 U.S.C. section 360bbb-3(b)(1), unless the authorization is terminated or revoked.  Performed at [facility], [address], [place], [place] [address]   ECHOCARDIOGRAM COMPLETE     Status: None   Collection Time: 12/07/23  1:10 PM  Result Value Ref Range   Weight 2,320 oz   Height 61 in   BP 109/70 mmHg   Single Plane A4C EF 66.5 %   S' Lateral 2.10 cm   Est EF 70 - 75%   Lactic acid, plasma     Status: None   Collection Time: 12/07/23  5:08 PM  Result Value Ref Range    Lactic Acid, Venous 1.1 0.5 - 1.9 mmol/L    Comment: Performed at [facility], [address], [place], [place] [address]  Brain natriuretic peptide     Status: None   Collection Time: 12/07/23  5:08 PM  Result Value Ref Range   B Natriuretic Peptide 75.5 0.0 - 100.0 pg/mL    Comment: Performed at [facility], [address], [place], [place] [address]  Protime-INR     Status: Abnormal   Collection Time: 12/07/23  5:08 PM  Result Value Ref Range   Prothrombin Time 15.4 (H) 11.4 - 15.2 seconds   INR 1.2 0.8 -  1.2    Comment: (NOTE) INR goal varies based on device and disease states. Performed at [facility], [address], [place], [place] [address]   CBC     Status: Abnormal   Collection Time: 12/07/23  5:08 PM  Result Value Ref Range   WBC 12.4 (H) 4.0 - 10.5 K/uL   RBC 3.53 (L) 3.87 - 5.11 MIL/uL   Hemoglobin 8.9 (L) 12.0 - 15.0 g/dL    Comment: REPEATED TO VERIFY   HCT 28.6 (L) 36.0 - 46.0 %   MCV 81.0 80.0 - 100.0 fL   MCH 25.2 (L) 26.0 - 34.0 pg   MCHC 31.1 30.0 - 36.0 g/dL   RDW 82.2 (H) 88.4 - 84.4 %   Platelets 342 150 - 400 K/uL   nRBC 0.0 0.0 - 0.2 %    Comment: Performed at [facility], [address], [place], [place] [address]  Heparin  level (unfractionated)     Status: None   Collection Time: 12/08/23  3:37 AM  Result Value Ref Range   Heparin  Unfractionated 0.56 0.30 - 0.70 IU/mL    Comment: (NOTE) The clinical reportable range upper limit is being lowered to >1.10 to align with the FDA approved guidance for the current laboratory assay.  If heparin  results are below expected values, and patient dosage has  been confirmed, suggest follow up testing of antithrombin III levels. Performed at [facility], [address], [place], [place] [address]   CBC     Status: Abnormal   Collection Time: 12/08/23  3:37 AM  Result Value Ref Range   WBC 11.8 (H) 4.0 - 10.5 K/uL   RBC 3.39 (L) 3.87 - 5.11 MIL/uL   Hemoglobin 8.4 (L)  12.0 - 15.0 g/dL   HCT 72.7 (L) 63.9 - 53.9 %   MCV 80.2 80.0 - 100.0 fL   MCH 24.8 (L) 26.0 - 34.0 pg   MCHC 30.9 30.0 - 36.0 g/dL   RDW 82.3 (H) 88.4 - 84.4 %   Platelets 331 150 - 400 K/uL   nRBC 0.0 0.0 - 0.2 %    Comment: Performed at [facility], [address], [place], [place] [address]  Basic metabolic panel     Status: Abnormal   Collection Time: 12/08/23  3:37 AM  Result Value Ref Range   Sodium 138 135 - 145 mmol/L   Potassium 4.0 3.5 - 5.1 mmol/L   Chloride 105 98 - 111 mmol/L   CO2 24 22 - 32 mmol/L   Glucose, Bld 118 (H) 70 - 99 mg/dL    Comment: Glucose reference range applies only to samples taken after fasting for at least 8 hours.   BUN 8 8 - 23 mg/dL   Creatinine, Ser 9.30 0.44 - 1.00 mg/dL   Calcium  8.8 (L) 8.9 - 10.3 mg/dL   GFR, Estimated >39 >39 mL/min    Comment: (NOTE) Calculated using the CKD-EPI Creatinine Equation (2021)    Anion gap 9 5 - 15    Comment: Performed at [facility], [address], [place], [place] [address]  Heparin  level (unfractionated)     Status: None   Collection Time: 12/09/23  4:28 AM  Result Value Ref Range   Heparin  Unfractionated 0.64 0.30 - 0.70 IU/mL    Comment: (NOTE) The clinical reportable range upper limit is being lowered to >1.10 to align with the FDA approved guidance for the current laboratory assay.  If heparin   results are below expected values, and patient dosage has  been confirmed, suggest follow up testing of antithrombin III levels. Performed at [facility], [address] [place], [place] [address]   CBC     Status: Abnormal   Collection Time: 12/09/23  4:28 AM  Result Value Ref Range   WBC 8.0 4.0 - 10.5 K/uL   RBC 3.46 (L) 3.87 - 5.11 MIL/uL   Hemoglobin 8.5 (L) 12.0 - 15.0 g/dL   HCT 71.9 (L) 63.9 - 53.9 %   MCV 80.9 80.0 - 100.0 fL   MCH 24.6 (L) 26.0 - 34.0 pg   MCHC 30.4 30.0 - 36.0 g/dL   RDW 82.0 (H) 88.4 - 84.4 %   Platelets 323 150 - 400 K/uL   nRBC 0.2 0.0 - 0.2  %    Comment: Performed at [facility], [address], [place], [place] [address]  Basic metabolic panel with GFR     Status: Abnormal   Collection Time: 12/09/23  4:28 AM  Result Value Ref Range   Sodium 139 135 - 145 mmol/L   Potassium 3.7 3.5 - 5.1 mmol/L   Chloride 105 98 - 111 mmol/L   CO2 24 22 - 32 mmol/L   Glucose, Bld 117 (H) 70 - 99 mg/dL    Comment: Glucose reference range applies only to samples taken after fasting for at least 8 hours.   BUN 11 8 - 23 mg/dL   Creatinine, Ser 9.16 0.44 - 1.00 mg/dL   Calcium  8.7 (L) 8.9 - 10.3 mg/dL   GFR, Estimated >39 >39 mL/min    Comment: (NOTE) Calculated using the CKD-EPI Creatinine Equation (2021)    Anion gap 10 5 - 15    Comment: Performed at [facility], [address], [place], [place] [address]  Magnesium      Status: None   Collection Time: 12/09/23  4:28 AM  Result Value Ref Range   Magnesium  2.1 1.7 - 2.4 mg/dL    Comment: Performed at [facility], [address], [place], [place] [address]  IBC + Ferritin     Status: Abnormal   Collection Time: 01/04/24 11:35 AM  Result Value Ref Range   Iron  40 (L) 42 - 145 ug/dL   Transferrin 753.9 787.9 - 360.0 mg/dL   Saturation Ratios 88.3 (L) 20.0 - 50.0 %   Ferritin 43.8 10.0 - 291.0 ng/mL   TIBC 344.4 250.0 - 450.0 mcg/dL  CBC w/Diff     Status: Abnormal   Collection Time: 01/04/24 11:35 AM  Result Value Ref Range   WBC 5.5 4.0 - 10.5 K/uL   RBC 4.58 3.87 - 5.11 Mil/uL   Hemoglobin 12.4 12.0 - 15.0 g/dL   HCT 61.2 63.9 - 53.9 %   MCV 84.5 78.0 - 100.0 fl   MCHC 32.1 30.0 - 36.0 g/dL   RDW 72.6 (H) 88.4 - 84.4 %   Platelets 205.0 150.0 - 400.0 K/uL   Neutrophils Relative % 45.6 43.0 - 77.0 %   Lymphocytes Relative 42.6 12.0 - 46.0 %   Monocytes Relative 8.7 3.0 - 12.0 %   Eosinophils Relative 2.2 0.0 - 5.0 %   Basophils Relative 0.9 0.0 - 3.0 %   Neutro Abs 2.5 1.4 - 7.7 K/uL   Lymphs Abs 2.3 0.7 - 4.0 K/uL   Monocytes Absolute 0.5 0.1 - 1.0  K/uL   Eosinophils Absolute 0.1 0.0 - 0.7 K/uL   Basophils Absolute 0.0  0.0 - 0.1 K/uL  Comp Met (CMET)     Status: Abnormal   Collection Time: 01/04/24 11:35 AM  Result Value Ref Range   Sodium 139 135 - 145 mEq/L   Potassium 3.8 3.5 - 5.1 mEq/L   Chloride 105 96 - 112 mEq/L   CO2 27 19 - 32 mEq/L   Glucose, Bld 104 (H) 70 - 99 mg/dL   BUN 11 6 - 23 mg/dL   Creatinine, Ser 9.28 0.40 - 1.20 mg/dL   Total Bilirubin 0.3 0.2 - 1.2 mg/dL   Alkaline Phosphatase 91 39 - 117 U/L   AST 17 0 - 37 U/L   ALT 15 0 - 35 U/L   Total Protein 6.7 6.0 - 8.3 g/dL   Albumin  4.3 3.5 - 5.2 g/dL   GFR 10.85 >39.99 mL/min    Comment: Calculated using the CKD-EPI Creatinine Equation (2021)   Calcium  9.0 8.4 - 10.5 mg/dL  CEA (Access)     Status: None   Collection Time: 02/15/24  3:12 PM  Result Value Ref Range   CEA ([facility]) 1.74 0.00 - 5.00 ng/mL    Comment: (NOTE) This test was performed using Beckman Coulter's paramagnetic chemiluminescent immunoassay. Values obtained from different assay methods cannot be used interchangeably. Please note that up to 8% of patients who smoke may see values 5.1-10.0 ng/ml and 1% of patients who smoke may see CEA levels >10.0 ng/ml. Performed at [facility], [address], [place], [place] [address]   PROTEIN S PANEL     Status: None   Collection Time: 02/15/24  3:12 PM  Result Value Ref Range   Protein S Ag, Total 81 60 - 150 %    Comment: (NOTE) This test was developed and its performance characteristics determined by [facility]. It has not been cleared or approved by the Food and Drug Administration.    Protein S Ag, Free 97 61 - 136 %   Protein S Activity 109 63 - 140 %    Comment: (NOTE) Protein S activity may be falsely increased (masking an abnormal, low result) in patients receiving direct Xa inhibitor (e.g., rivaroxaban, apixaban , edoxaban) or a direct thrombin inhibitor (e.g., dabigatran) anticoagulant treatment due to assay  interference by these drugs. Performed At: [facility] [address] [place], [place] [id number] Jennette Shorter MD Ey:[phone]   Protein C activity     Status: Abnormal   Collection Time: 02/15/24  3:12 PM  Result Value Ref Range   Protein C Activity 193 (H) 73 - 180 %    Comment: (NOTE) Elevated protein C activity is of no known clinical significance. Performed At: [facility] [address] [place], [place] [id number] Jennette Shorter MD Ey:[phone]   Antithrombin panel     Status: Abnormal   Collection Time: 02/15/24  3:12 PM  Result Value Ref Range   Antithrombin Activity 181 (H) 75 - 135 %    Comment: (NOTE) An elevated antithrombin activity is of no known clinical significance. Direct Xa inhibitor anticoagulants such as rivaroxaban, apixaban  and edoxaban will lead to spuriously elevated antithrombin activity levels possibly masking a deficiency.    AT III AG PPP IMM-ACNC 118 72 - 124 %    Comment: (NOTE) This test was developed and its performance characteristics determined by [facility]. It has not been cleared or approved by the Food and Drug Administration. Performed At: [facility] [address] [place], [place] [id number] Jennette Shorter MD Ey:[phone]   Factor 5 leiden  Status: None   Collection Time: 02/15/24  3:12 PM  Result Value Ref Range   Recommendations-F5LEID: Comment     Comment: (NOTE) Result: c.1601G>A (p.Arg534Gln) - Not Detected This result is not associated with an increased risk for venous thromboembolism. See Additional Clinical Information and Comments. Additional Clinical Information:    Venous thromboembolism is a multifactorial disease influenced by genetic, environmental, and circumstantial risk factors. The c.1601G>A (p. Arg534Gln) variant in the F5 gene, commonly referred to as Factor V Leiden, is a genetic risk factor for venous thromboembolism. Heterozygous carriers of this variant have a 6- to  8- fold increased risk for venous thromboembolism. Individuals homozygous for this variant (ie, with a copy of the variant on each chromosome) have an approximately 80-fold increased risk for venous thromboembolism. Individuals who carry both a c.*97G>A variant in the F2 gene and Factor V Leiden have an approximately 20-fold increased risk for venous thromboembolism. Risks are likely to be even higher in more complex genotype combinations  involving the F2 c.*97G>A variant and Factor V Leiden (PMID: 66325232). Additional risk factors include but are not limited to: deficiency of protein C, protein S, or antithrombin III, age, female sex, personal or family history of deep vein thromboembolism, smoking, surgery, prolonged immobilization, malignant neoplasm, tamoxifen treatment, raloxifene treatment, oral contraceptive use, hormone replacement therapy, and pregnancy. Management of thrombotic risk and thrombotic events should follow established guidelines and fit the clinical circumstance. This result cannot predict the occurrence or recurrence of a thrombotic event. Comment:    Genetic counseling is recommended to discuss the potential clinical implications of positive results, as well as recommendations for testing family members.    Genetic Coordinators are available for health care providers to discuss results at [phone]). Test Details:    Variant Analyzed: c.1601G>A (p. Arg534Gln), referre d to as Factor V Leiden Methods/Limitations:    DNA analysis of the F5 gene (NM_000130.5) was performed by PCR amplification followed by electrophoresis. The diagnostic sensitivity is >99%. Results must be combined with clinical information for the most accurate interpretation. Molecular-based testing is highly accurate, but as in any laboratory test, diagnostic errors may occur. False positive or false negative results may occur for reasons that include genetic variants,  blood transfusions, bone marrow transplantation, somatic or tissue-specific mosaicism, mislabeled samples, or erroneous representation of family relationships.    This test was developed and its performance characteristics determined by [facility]. It has not been cleared or approved by the Food and Drug Administration. References:    Bhatt S, [place], Lozano R, Grody [facility], [facility]; ACMG Professional Practice and Guidelines Committee. Addendum: [employer] of Medical Genetics consensus statemen t on factor V Leiden mutation testing. Genet Med. 2021 Mar 5. doi: 89.8961/d58563-978- 01108-x. PMID: 66325232.    Hosey RUSH. Factor V Leiden Thrombophilia. 1999 May 14 (Updated 2018 Jan 4). In: Juliene POSNER, Ardinger HH, Pagon RA, et al., editors. GeneReviews(R) (Internet). [address] [facility]): [employer] , [place]; 8006-7978. Available from: [email or web address]    Laurita GORMAN Waddell BRIDGETT, Huang X, Luo B, Spector EB, Ileana SHAUNNA Gal CS; ACMG Laboratory Quality Assurance Committee. Venous thromboembolism laboratory testing (factor V Leiden and factor II c. *97G>A), 2018 update: a technical standard of the [employer] of [employer] and Genomics (ACMG). Genet Med. 2018 Izr;79(87): 8510-8501. doi: 10.1038/s41436-(580) 535-8786-z. Epub 2018 Oct 5. PMID: 69702301.    Reviewed By: Comment     Comment: (NOTE) Technical Component performed at [facility] Professional Component performed by: Fairy WENDI Aid, PhD, FACMG [id number], [facility],  [address] [place] [address] Performed At: [facility] [address] [place], [place] [id number] Loran Gales MDPhD Ey:[phone]   Prothrombin gene mutation     Status: None   Collection Time: 02/15/24  3:12 PM  Result Value Ref Range   Recommendations-PTGENE: Comment     Comment: (NOTE) Result: c.*97G>A - Not Detected This result is not associated with an increased risk for venous thromboembolism. See Additional  Clinical Information and Comments. Additional Clinical Information: Venous thromboembolism is a multifactorial disease influenced by genetic, environmental, and circumstantial risk factors. The c.*97G>A variant in the F2 gene is a genetic risk factor for venous thromboembolism. Heterozygous carriers have a 2- to 4-fold increased risk for venous thromboembolism. Homozygotes for the c.*97G>A variant are rare. The annual risk of VTE in homozygotes has been reported to be 1.1%/year. Individuals who carry both a c.*97G>A variant in the F2 gene and a c.1601G>A (p. Arg534Gln) variant in the F5 gene (commonly referred to as Factor V Leiden) have an approximately 20- fold increased risk for venous thromboembolism. Risks are likely to be even higher in more complex genotype combinations involving the F2 c.*97G>A variant and Factor V Leiden (PMID:  66325232). Additional risk factors include but are not limited to: deficiency of protein C, protein S, or antithrombin III, age, female sex, personal or family history of deep vein thromboembolism, smoking, surgery, prolonged immobilization, malignant neoplasm, tamoxifen treatment, raloxifene treatment, oral contraceptive use, hormone replacement therapy, and pregnancy. Management of thrombotic risk and thrombotic events should follow established guidelines and fit the clinical circumstance. This result cannot predict the occurrence or recurrence of a thrombotic event. Comments: Genetic counseling is recommended to discuss the potential clinical implications of positive results, as well as recommendations for testing family members. Genetic Coordinators are available for health care providers to discuss results at [phone]). Test Details: Variant analyzed: c.*97G>A, previously referred to as G20210A Methods/Limitations: DNA analysis of the F2 gene (NM_000 506.5) was performed by PCR amplification followed by restriction enzyme analysis.  The diagnostic sensitivity is >99%. Results must be combined with clinical information for the most accurate interpretation. Molecular-based testing is highly accurate, but as in any laboratory test, diagnostic errors may occur. False positive or false negative results may occur for reasons that include genetic variants, blood transfusions, bone marrow transplantation, somatic or tissue-specific mosaicism, mislabeled samples, or erroneous representation of family relationships. This test was developed and its performance characteristics determined by [facility]. It has not been cleared or approved by the Food and Drug Administration. References: Bhatt S, [place], Lozano R, Grody [facility], [facility]; ACMG Professional Practice and Guidelines Committee. Addendum: [employer] of [employer] Genetics consensus statement on factor V Leiden mutation testing. Genet Med. 2021 Mar 5. doi: 89.8961/d585 [id number]-x. PMID: 66325232. Hosey RUSH. Prothrombin Thrombophilia. 2006 Jul 25 [Updated 2021 Feb 4]. In: Juliene POSNER, Ardinger HH, Pagon RA, et al., editors. GeneReviews(R) [Internet]. [address] [facility]): [employer] , [place]; 8006-7978. Available from: [email or web address] Laurita GORMAN Waddell BRIDGETT, Huang X, Luo B, Spector EB, Ileana SHAUNNA Gal CS; ACMG Laboratory Quality Assurance Committee. Venous thromboembolism laboratory testing (factor V Leiden and factor II c.*97G>A), 2018 update: a technical standard of the [employer] of [employer] and Genomics (ACMG). Genet Med. 2018 Dec;20(12):1489-1498. doi: 10.1038/s41436-509-350-6836-z. Epub 2018 Oct 5. PMID: 69702301.    Reviewed by: Comment     Comment: (NOTE) Technical Component performed at [facility] Professional Component performed by: Slater LELON Donate, PhD, [facility] [id number], [facility], [address] [place]  [place] [address] Performed At: [facility] [address] [place], [place] [id number] Loran Gales MDPhD  Ey:[phone]   Cardiolipin antibodies, IgG, IgM, IgA     Status: None   Collection Time: 02/15/24  3:12 PM  Result Value Ref Range   Anticardiolipin IgG <9 0 - 14 GPL U/mL    Comment: (NOTE)                          Negative:              <15                          Indeterminate:     15 - 20                          Low-Med Positive: >20 - 80                          High Positive:         >80    Anticardiolipin IgM <9 0 - 12 MPL U/mL    Comment: (NOTE)                          Negative:              <13                          Indeterminate:     13 - 20                          Low-Med Positive: >20 - 80                          High Positive:         >80    Anticardiolipin IgA <9 0 - 11 APL U/mL    Comment: (NOTE)                          Negative:              <12                          Indeterminate:     12 - 20                          Low-Med Positive: >20 - 80                          High Positive:         >80 Performed At: [facility] [address] [place], [place] [id number] Jennette Shorter MD Ey:[phone]   Beta-2 -glycoprotein i abs, IgG/M/A     Status: None   Collection Time: 02/15/24  3:12 PM  Result Value Ref Range   Beta-2  Glyco I IgG <9 0 - 20 GPI IgG units   Beta-2 -Glycoprotein I IgM <9 0 - 32 GPI IgM units    Comment: (NOTE) Performed At: [facility] [address] [place], [place] [id number] Jennette Shorter MD Ey:[phone]    Beta-2 -Glycoprotein I IgA <  9 0 - 25 GPI IgA units  D-dimer, quantitative     Status: None   Collection Time: 02/15/24  3:12 PM  Result Value Ref Range   D-Dimer, Quant <0.27 0.00 - 0.50 ug/mL-FEU    Comment: (NOTE) At the manufacturer cut-off value of 0.5 g/mL FEU, this assay has a negative predictive value of 95-100%.This assay is intended for use in conjunction with a clinical pretest probability (PTP) assessment model to exclude pulmonary embolism (PE) and deep venous thrombosis (DVT) in outpatients  suspected of PE or DVT. Results should be correlated with clinical presentation. Performed at [facility], [address] [place], [place] [address]   Ferritin     Status: None   Collection Time: 02/15/24  3:12 PM  Result Value Ref Range   Ferritin 29 11 - 307 ng/mL    Comment: Performed at [facility], [address] [place], [place] [address]  Iron  and TIBC     Status: None   Collection Time: 02/15/24  3:12 PM  Result Value Ref Range   Iron  46 28 - 170 ug/dL   TIBC 660 749 - 549 ug/dL   Saturation Ratios 14 10.4 - 31.8 %   UIBC 293 ug/dL    Comment: Performed at [facility], [address], [place], [place] [address]  CMP (Cancer Center only)     Status: None   Collection Time: 02/15/24  3:12 PM  Result Value Ref Range   Sodium 140 135 - 145 mmol/L   Potassium 3.8 3.5 - 5.1 mmol/L   Chloride 104 98 - 111 mmol/L   CO2 26 22 - 32 mmol/L   Glucose, Bld 95 70 - 99 mg/dL    Comment: Glucose reference range applies only to samples taken after fasting for at least 8 hours.   BUN 12 8 - 23 mg/dL   Creatinine 9.40 9.55 - 1.00 mg/dL   Calcium  9.6 8.9 - 10.3 mg/dL   Total Protein 6.9 6.5 - 8.1 g/dL   Albumin  4.3 3.5 - 5.0 g/dL   AST 20 15 - 41 U/L   ALT 21 0 - 44 U/L   Alkaline Phosphatase 101 38 - 126 U/L   Total Bilirubin 0.3 0.0 - 1.2 mg/dL   GFR, Estimated >39 >39 mL/min    Comment: (NOTE) Calculated using the CKD-EPI Creatinine Equation (2021)    Anion gap 10 5 - 15    Comment: Performed at [facility], [address] [place], [place] [address]  CBC with Differential (Cancer Center Only)     Status: Abnormal   Collection Time: 02/15/24  3:12 PM  Result Value Ref Range   WBC Count 6.3 4.0 - 10.5 K/uL   RBC 4.23 3.87 - 5.11 MIL/uL   Hemoglobin 12.7 12.0 - 15.0 g/dL   HCT 61.8 63.9 - 53.9 %   MCV 90.1 80.0 - 100.0 fL   MCH 30.0 26.0 - 34.0 pg   MCHC 33.3 30.0 - 36.0 g/dL   RDW 79.2 (H) 88.4 - 84.4 %    Platelet Count 251 150 - 400 K/uL   nRBC 0.0 0.0 - 0.2 %   Neutrophils Relative % 56 %   Neutro Abs 3.6 1.7 - 7.7 K/uL   Lymphocytes Relative 33 %   Lymphs Abs 2.1 0.7 - 4.0 K/uL   Monocytes Relative 8 %   Monocytes Absolute 0.5 0.1 - 1.0 K/uL   Eosinophils Relative 2 %   Eosinophils Absolute 0.1 0.0 -  0.5 K/uL   Basophils Relative 1 %   Basophils Absolute 0.0 0.0 - 0.1 K/uL   Immature Granulocytes 0 %   Abs Immature Granulocytes 0.01 0.00 - 0.07 K/uL    Comment: Performed at [facility], [address] [place], [place] [address]     RADIOGRAPHIC STUDIES:  No recent pertinent imaging studies available to review.  Orders Placed This Encounter  Procedures   CBC with Differential (Cancer Center Only)    Standing Status:   Future    Expiration Date:   02/27/2025   D-dimer, quantitative    Standing Status:   Future    Expiration Date:   02/27/2025     Future Appointments  Date Time Provider Department Center  06/08/2024 10:30 AM [facility] 1 [facility] [id number]  06/08/2024 11:00 AM [facility]  1 [facility]  [id number]  06/20/2024  2:00 PM [facility] PHLEBOTOMIST [facility] None  06/20/2024  2:30 PM Alessa Mazur, [place], MD [facility] None    This document was completed utilizing speech recognition software. Grammatical errors, random word insertions, pronoun errors, and incomplete sentences are an occasional consequence of this system due to software limitations, ambient noise, and hardware issues. Any formal questions or concerns about the content, text or information contained within the body of this dictation should be directly addressed to the provider for clarification.  "

## 2024-02-28 NOTE — Assessment & Plan Note (Signed)
 May 31st, 2018 she underwent: Laparotomy, cytoreductive surgery, partial cystectomy, omentectomy, placement of four chemoperfusion cannulas, hyperthermic intraperitoneal chemotherapy with oxaliplatin following an R1 type resection. She required a partial cystectomy and was discharged with a foley catheter in place.  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  She has remained in remission.  She continues to follow-up with Dr. Dallas Dan at [facility], last seen in April 2025 for follow-up.  Appendiceal cancer treated with intraperitoneal chemotherapy in 2018. No recurrence on recent scans.

## 2024-02-28 NOTE — Assessment & Plan Note (Signed)
 Diagnosed in September 2025 with no current dyspnea. On Eliquis  twice daily with no missed doses. No recent long travel, bed rest, or estrogen use. Decreased mobility and dehydration may have contributed to clotting risk.   Will proceed with thrombophilia workup with factor V Leiden mutation, prothrombin gene mutation, beta-2  glycoprotein antibodies, anticardiolipin antibodies, protein C activity, protein S activity, Antithrombin III activity.  - Continue Eliquis  twice daily  - Plan for CT scan in late March to evaluate for residual clots  - Plan for ultrasound of the leg in late March  - Will consider holding Eliquis  for two days in January for endoscopy and colonoscopy if diarrhea persists

## 2024-03-09 ENCOUNTER — Encounter: Payer: Self-pay | Admitting: Oncology

## 2024-03-09 NOTE — Assessment & Plan Note (Signed)
 Diagnosed in September 2025 with no current dyspnea. On Eliquis  twice daily with no missed doses. No recent long travel, bed rest, or estrogen use. Decreased mobility and dehydration may have contributed to clotting risk.   On her consultation with us  on 02/15/2024, we pursued thrombophilia workup.  Prothrombin gene mutation, factor V Leiden mutation were negative.  No evidence of beta-2  glycoprotein antibodies or anticardiolipin antibodies.  Protein C activity, protein S activity, Antithrombin III activity were within normal limits.  D-dimer was undetectable.  Grossly negative thrombophilia workup.  - Continue Eliquis  twice daily  - Plan for CT scan in late March to evaluate for residual clots  - Plan for ultrasound of the leg in late March  - Will consider holding Eliquis  for two days in January for endoscopy and colonoscopy if diarrhea persists

## 2024-03-09 NOTE — Assessment & Plan Note (Signed)
 May 31st, 2018 she underwent: Laparotomy, cytoreductive surgery, partial cystectomy, omentectomy, placement of four chemoperfusion cannulas, hyperthermic intraperitoneal chemotherapy with oxaliplatin following an R1 type resection. She required a partial cystectomy and was discharged with a foley catheter in place.  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  She has remained in remission.  She continues to follow-up with Dr. Dallas Dan at [facility], last seen in April 2025 for follow-up.  Appendiceal cancer treated with intraperitoneal chemotherapy in 2018. No recurrence on recent scans.

## 2024-03-23 ENCOUNTER — Telehealth: Payer: Self-pay

## 2024-03-23 NOTE — Telephone Encounter (Signed)
 [facility] Pre-operative Risk Assessment     Request for surgical clearance:     Endoscopy Procedure  What type of surgery is being performed?     Endoscopy/colonoscopy  When is this surgery scheduled?     04/25/24  What type of clearance is required ?   Pharmacy  Are there any medications that need to be held prior to surgery and how long? Eliquis  2 days  Practice name and name of physician performing surgery?      [facility]  What is your office phone and fax number?      Phone- [phone]  Fax- [phone]  Anesthesia type (None, local, MAC, general) ?       MAC   Please route your response to Rock Mania RN

## 2024-03-23 NOTE — Telephone Encounter (Signed)
 Telephone note sent to oncologist to provide approval to hold Eliquis  for 2 days prior to endo colon on 04/27/24. Will await further communication from office.

## 2024-03-23 NOTE — Telephone Encounter (Signed)
 While preparing patient chart for PV appt on 03/24/24, RN observed that patient is on an anticoagulant medication, requiring a hold prior to procedure which is currently scheduled for 04/25/24 at [facility].  TE s from patient hematologist dated 12/2 and 12/15 state that he Will consider holding Eliquis  for two days in January for endoscopy and colonoscopy if diarrhea persists.   Confirmation of being able to hold this anticoagulant is needed prior to patient's procedure. RN will contact Dr. Pamula RN for receiving this confirmation.

## 2024-03-24 ENCOUNTER — Ambulatory Visit

## 2024-03-24 ENCOUNTER — Other Ambulatory Visit: Payer: Self-pay

## 2024-03-24 VITALS — Ht 62.0 in | Wt 150.0 lb

## 2024-03-24 DIAGNOSIS — R197 Diarrhea, unspecified: Secondary | ICD-10-CM

## 2024-03-24 DIAGNOSIS — D508 Other iron deficiency anemias: Secondary | ICD-10-CM

## 2024-03-24 MED ORDER — NA SULFATE-K SULFATE-MG SULF 17.5-3.13-1.6 GM/177ML PO SOLN: 1.0000 | Freq: Once | ORAL | 0 refills | Status: AC

## 2024-03-24 NOTE — Progress Notes (Signed)
 Denies allergies to eggs or soy products. Denies complication of anesthesia or sedation. Denies use of weight loss medication. Denies use of O2.   Emmi instructions given for colonoscopy.

## 2024-04-25 ENCOUNTER — Encounter: Admitting: Internal Medicine

## 2024-06-08 ENCOUNTER — Other Ambulatory Visit (HOSPITAL_BASED_OUTPATIENT_CLINIC_OR_DEPARTMENT_OTHER)

## 2024-06-20 ENCOUNTER — Inpatient Hospital Stay: Admitting: Oncology

## 2024-06-20 ENCOUNTER — Inpatient Hospital Stay
